# Patient Record
Sex: Female | Born: 1945 | Race: White | Hispanic: No | Marital: Single | Smoking: Former smoker
Health system: Southern US, Community
[De-identification: ages and names within clinical notes are randomized; demographics above are authoritative.]

## PROBLEM LIST (undated history)

## (undated) DIAGNOSIS — M8580 Other specified disorders of bone density and structure, unspecified site: Secondary | ICD-10-CM

## (undated) DIAGNOSIS — K635 Polyp of colon: Secondary | ICD-10-CM

## (undated) DIAGNOSIS — M1990 Unspecified osteoarthritis, unspecified site: Secondary | ICD-10-CM

## (undated) DIAGNOSIS — E785 Hyperlipidemia, unspecified: Secondary | ICD-10-CM

## (undated) DIAGNOSIS — F419 Anxiety disorder, unspecified: Secondary | ICD-10-CM

## (undated) DIAGNOSIS — C4492 Squamous cell carcinoma of skin, unspecified: Secondary | ICD-10-CM

## (undated) HISTORY — DX: Polyp of colon: K63.5

## (undated) HISTORY — DX: Squamous cell carcinoma of skin, unspecified: C44.92

## (undated) HISTORY — PX: CARPAL TUNNEL RELEASE: SHX101

## (undated) HISTORY — DX: Unspecified osteoarthritis, unspecified site: M19.90

## (undated) HISTORY — DX: Hyperlipidemia, unspecified: E78.5

## (undated) HISTORY — PX: WISDOM TOOTH EXTRACTION: SHX21

## (undated) HISTORY — DX: Anxiety disorder, unspecified: F41.9

## (undated) HISTORY — DX: Other specified disorders of bone density and structure, unspecified site: M85.80

## (undated) HISTORY — PX: KNEE ARTHROSCOPY: SUR90

---

## 2002-04-07 ENCOUNTER — Encounter: Payer: Self-pay | Admitting: Emergency Medicine

## 2002-04-07 ENCOUNTER — Emergency Department (HOSPITAL_COMMUNITY): Admission: EM | Admit: 2002-04-07 | Discharge: 2002-04-07 | Payer: Self-pay | Admitting: Emergency Medicine

## 2002-07-18 ENCOUNTER — Encounter (INDEPENDENT_AMBULATORY_CARE_PROVIDER_SITE_OTHER): Payer: Self-pay | Admitting: *Deleted

## 2002-07-18 ENCOUNTER — Ambulatory Visit (HOSPITAL_BASED_OUTPATIENT_CLINIC_OR_DEPARTMENT_OTHER): Admission: RE | Admit: 2002-07-18 | Discharge: 2002-07-18 | Payer: Self-pay | Admitting: General Surgery

## 2003-01-01 ENCOUNTER — Ambulatory Visit (HOSPITAL_BASED_OUTPATIENT_CLINIC_OR_DEPARTMENT_OTHER): Admission: RE | Admit: 2003-01-01 | Discharge: 2003-01-01 | Payer: Self-pay | Admitting: Orthopedic Surgery

## 2003-05-05 ENCOUNTER — Encounter (INDEPENDENT_AMBULATORY_CARE_PROVIDER_SITE_OTHER): Payer: Self-pay | Admitting: *Deleted

## 2003-05-05 ENCOUNTER — Ambulatory Visit (HOSPITAL_COMMUNITY): Admission: RE | Admit: 2003-05-05 | Discharge: 2003-05-05 | Payer: Self-pay | Admitting: Plastic Surgery

## 2003-05-05 ENCOUNTER — Ambulatory Visit (HOSPITAL_BASED_OUTPATIENT_CLINIC_OR_DEPARTMENT_OTHER): Admission: RE | Admit: 2003-05-05 | Discharge: 2003-05-05 | Payer: Self-pay | Admitting: Plastic Surgery

## 2003-06-29 ENCOUNTER — Other Ambulatory Visit: Admission: RE | Admit: 2003-06-29 | Discharge: 2003-06-29 | Payer: Self-pay | Admitting: Family Medicine

## 2003-07-16 ENCOUNTER — Encounter: Admission: RE | Admit: 2003-07-16 | Discharge: 2003-07-16 | Payer: Self-pay | Admitting: Family Medicine

## 2004-09-07 ENCOUNTER — Ambulatory Visit: Payer: Self-pay | Admitting: Family Medicine

## 2005-03-15 ENCOUNTER — Encounter: Payer: Self-pay | Admitting: Family Medicine

## 2005-03-15 ENCOUNTER — Other Ambulatory Visit: Admission: RE | Admit: 2005-03-15 | Discharge: 2005-03-15 | Payer: Self-pay | Admitting: Family Medicine

## 2005-03-15 ENCOUNTER — Ambulatory Visit: Payer: Self-pay | Admitting: Family Medicine

## 2005-03-31 ENCOUNTER — Ambulatory Visit: Payer: Self-pay | Admitting: Family Medicine

## 2005-09-27 ENCOUNTER — Ambulatory Visit: Payer: Self-pay | Admitting: Family Medicine

## 2005-09-29 ENCOUNTER — Encounter: Admission: RE | Admit: 2005-09-29 | Discharge: 2005-09-29 | Payer: Self-pay | Admitting: Family Medicine

## 2006-03-26 ENCOUNTER — Ambulatory Visit: Payer: Self-pay | Admitting: Family Medicine

## 2006-07-03 ENCOUNTER — Ambulatory Visit: Payer: Self-pay | Admitting: Family Medicine

## 2006-07-03 LAB — CONVERTED CEMR LAB
ALT: 19 units/L (ref 0–40)
AST: 20 units/L (ref 0–37)
Albumin: 3.8 g/dL (ref 3.5–5.2)
Alkaline Phosphatase: 65 units/L (ref 39–117)
Bilirubin, Direct: 0.1 mg/dL (ref 0.0–0.3)
Cholesterol: 248 mg/dL (ref 0–200)
Direct LDL: 173.3 mg/dL
HDL: 61 mg/dL (ref >39.0)
Total Bilirubin: 0.6 mg/dL (ref 0.3–1.2)
Total CHOL/HDL Ratio: 4.1
Total Protein: 6.7 g/dL (ref 6.0–8.3)
Triglycerides: 116 mg/dL (ref 0–149)
VLDL: 23 mg/dL (ref 0–40)

## 2006-10-31 ENCOUNTER — Ambulatory Visit: Payer: Self-pay | Admitting: Family Medicine

## 2007-01-01 ENCOUNTER — Ambulatory Visit: Payer: Self-pay | Admitting: Family Medicine

## 2007-01-01 ENCOUNTER — Encounter: Payer: Self-pay | Admitting: Family Medicine

## 2007-01-01 ENCOUNTER — Other Ambulatory Visit: Admission: RE | Admit: 2007-01-01 | Discharge: 2007-01-01 | Payer: Self-pay | Admitting: Family Medicine

## 2007-01-01 ENCOUNTER — Encounter (INDEPENDENT_AMBULATORY_CARE_PROVIDER_SITE_OTHER): Payer: Self-pay | Admitting: *Deleted

## 2007-01-01 DIAGNOSIS — F411 Generalized anxiety disorder: Secondary | ICD-10-CM

## 2007-01-01 DIAGNOSIS — M899 Disorder of bone, unspecified: Secondary | ICD-10-CM | POA: Insufficient documentation

## 2007-01-01 DIAGNOSIS — L723 Sebaceous cyst: Secondary | ICD-10-CM | POA: Insufficient documentation

## 2007-01-01 DIAGNOSIS — M949 Disorder of cartilage, unspecified: Secondary | ICD-10-CM

## 2007-01-01 DIAGNOSIS — Z9889 Other specified postprocedural states: Secondary | ICD-10-CM | POA: Insufficient documentation

## 2007-01-01 LAB — CONVERTED CEMR LAB
Bilirubin Urine: NEGATIVE
Blood in Urine, dipstick: NEGATIVE
Glucose, Urine, Semiquant: NEGATIVE
Ketones, urine, test strip: NEGATIVE
Protein, U semiquant: NEGATIVE

## 2007-01-08 ENCOUNTER — Encounter (INDEPENDENT_AMBULATORY_CARE_PROVIDER_SITE_OTHER): Payer: Self-pay | Admitting: *Deleted

## 2007-01-09 LAB — CONVERTED CEMR LAB
ALT: 17 units/L (ref 0–35)
AST: 18 units/L (ref 0–37)
Alkaline Phosphatase: 65 units/L (ref 39–117)
BUN: 16 mg/dL (ref 6–23)
Basophils Relative: 0.3 % (ref 0.0–1.0)
Bilirubin, Direct: 0.1 mg/dL (ref 0.0–0.3)
CO2: 31 meq/L (ref 19–32)
Calcium: 9.3 mg/dL (ref 8.4–10.5)
Chloride: 107 meq/L (ref 96–112)
Eosinophils Absolute: 0.1 10*3/uL (ref 0.0–0.6)
Eosinophils Relative: 1.6 % (ref 0.0–5.0)
GFR calc non Af Amer: 78 mL/min
Glucose, Bld: 108 mg/dL — ABNORMAL HIGH (ref 70–99)
HDL: 63.2 mg/dL (ref >39.0)
Platelets: 241 10*3/uL (ref 150–400)
RBC: 4.29 M/uL (ref 3.87–5.11)
Total CHOL/HDL Ratio: 4
Triglycerides: 103 mg/dL (ref 0–149)
VLDL: 21 mg/dL (ref 0–40)
WBC: 4.4 10*3/uL — ABNORMAL LOW (ref 4.5–10.5)

## 2007-03-19 ENCOUNTER — Encounter: Admission: RE | Admit: 2007-03-19 | Discharge: 2007-03-19 | Payer: Self-pay | Admitting: Family Medicine

## 2007-03-21 ENCOUNTER — Encounter (INDEPENDENT_AMBULATORY_CARE_PROVIDER_SITE_OTHER): Payer: Self-pay | Admitting: *Deleted

## 2007-04-10 ENCOUNTER — Ambulatory Visit: Payer: Self-pay | Admitting: Family Medicine

## 2007-04-10 ENCOUNTER — Telehealth (INDEPENDENT_AMBULATORY_CARE_PROVIDER_SITE_OTHER): Payer: Self-pay | Admitting: *Deleted

## 2007-04-17 ENCOUNTER — Encounter (INDEPENDENT_AMBULATORY_CARE_PROVIDER_SITE_OTHER): Payer: Self-pay | Admitting: *Deleted

## 2007-04-17 LAB — CONVERTED CEMR LAB
AST: 17 units/L (ref 0–37)
Albumin: 4 g/dL (ref 3.5–5.2)
Bilirubin, Direct: 0.1 mg/dL (ref 0.0–0.3)
Direct LDL: 180.9 mg/dL
HDL: 60.7 mg/dL (ref >39.0)
Total CHOL/HDL Ratio: 4.3
VLDL: 25 mg/dL (ref 0–40)

## 2007-05-06 ENCOUNTER — Telehealth (INDEPENDENT_AMBULATORY_CARE_PROVIDER_SITE_OTHER): Payer: Self-pay | Admitting: *Deleted

## 2007-06-10 ENCOUNTER — Telehealth (INDEPENDENT_AMBULATORY_CARE_PROVIDER_SITE_OTHER): Payer: Self-pay | Admitting: *Deleted

## 2007-07-08 ENCOUNTER — Telehealth (INDEPENDENT_AMBULATORY_CARE_PROVIDER_SITE_OTHER): Payer: Self-pay | Admitting: *Deleted

## 2007-07-31 ENCOUNTER — Ambulatory Visit: Payer: Self-pay | Admitting: Family Medicine

## 2007-08-02 ENCOUNTER — Telehealth (INDEPENDENT_AMBULATORY_CARE_PROVIDER_SITE_OTHER): Payer: Self-pay | Admitting: *Deleted

## 2007-08-11 LAB — CONVERTED CEMR LAB
Alkaline Phosphatase: 68 units/L (ref 39–117)
Bilirubin, Direct: 0.1 mg/dL (ref 0.0–0.3)
CRP, High Sensitivity: 3 (ref 0.00–5.00)
Cholesterol: 264 mg/dL (ref 0–200)
Direct LDL: 176.7 mg/dL
HDL: 66.3 mg/dL (ref >39.0)
Total Bilirubin: 0.5 mg/dL (ref 0.3–1.2)
Total CHOL/HDL Ratio: 4
Total Protein: 7.1 g/dL (ref 6.0–8.3)
Triglycerides: 112 mg/dL (ref 0–149)

## 2007-08-12 ENCOUNTER — Encounter (INDEPENDENT_AMBULATORY_CARE_PROVIDER_SITE_OTHER): Payer: Self-pay | Admitting: *Deleted

## 2007-08-30 ENCOUNTER — Telehealth (INDEPENDENT_AMBULATORY_CARE_PROVIDER_SITE_OTHER): Payer: Self-pay | Admitting: *Deleted

## 2007-09-10 ENCOUNTER — Telehealth (INDEPENDENT_AMBULATORY_CARE_PROVIDER_SITE_OTHER): Payer: Self-pay | Admitting: *Deleted

## 2007-09-26 ENCOUNTER — Ambulatory Visit: Payer: Self-pay | Admitting: Internal Medicine

## 2007-09-26 DIAGNOSIS — E785 Hyperlipidemia, unspecified: Secondary | ICD-10-CM

## 2008-02-13 ENCOUNTER — Ambulatory Visit: Payer: Self-pay | Admitting: Family Medicine

## 2008-03-30 ENCOUNTER — Ambulatory Visit: Payer: Self-pay | Admitting: Family Medicine

## 2008-03-30 DIAGNOSIS — J019 Acute sinusitis, unspecified: Secondary | ICD-10-CM

## 2008-04-17 ENCOUNTER — Ambulatory Visit: Payer: Self-pay | Admitting: Family Medicine

## 2008-04-17 DIAGNOSIS — J069 Acute upper respiratory infection, unspecified: Secondary | ICD-10-CM

## 2008-08-17 ENCOUNTER — Ambulatory Visit: Payer: Self-pay | Admitting: Family Medicine

## 2008-08-17 DIAGNOSIS — Z860100 Personal history of colon polyps, unspecified: Secondary | ICD-10-CM | POA: Insufficient documentation

## 2008-09-04 ENCOUNTER — Encounter (INDEPENDENT_AMBULATORY_CARE_PROVIDER_SITE_OTHER): Payer: Self-pay | Admitting: *Deleted

## 2008-09-10 ENCOUNTER — Encounter: Payer: Self-pay | Admitting: Family Medicine

## 2008-10-05 ENCOUNTER — Ambulatory Visit: Payer: Self-pay | Admitting: Internal Medicine

## 2008-10-19 ENCOUNTER — Encounter: Payer: Self-pay | Admitting: Internal Medicine

## 2008-10-19 ENCOUNTER — Ambulatory Visit: Payer: Self-pay | Admitting: Internal Medicine

## 2008-10-20 ENCOUNTER — Encounter: Payer: Self-pay | Admitting: Internal Medicine

## 2009-01-26 ENCOUNTER — Encounter: Admission: RE | Admit: 2009-01-26 | Discharge: 2009-01-26 | Payer: Self-pay | Admitting: Family Medicine

## 2009-02-16 ENCOUNTER — Ambulatory Visit: Payer: Self-pay | Admitting: Family Medicine

## 2009-02-19 ENCOUNTER — Telehealth (INDEPENDENT_AMBULATORY_CARE_PROVIDER_SITE_OTHER): Payer: Self-pay | Admitting: *Deleted

## 2009-02-23 ENCOUNTER — Encounter: Payer: Self-pay | Admitting: Family Medicine

## 2009-08-11 ENCOUNTER — Other Ambulatory Visit: Admission: RE | Admit: 2009-08-11 | Discharge: 2009-08-11 | Payer: Self-pay | Admitting: Family Medicine

## 2009-08-11 ENCOUNTER — Ambulatory Visit: Payer: Self-pay | Admitting: Family Medicine

## 2009-08-11 DIAGNOSIS — M545 Low back pain: Secondary | ICD-10-CM

## 2009-08-11 DIAGNOSIS — N393 Stress incontinence (female) (male): Secondary | ICD-10-CM

## 2009-08-13 ENCOUNTER — Encounter: Payer: Self-pay | Admitting: Family Medicine

## 2009-08-13 LAB — CONVERTED CEMR LAB: Vit D, 25-Hydroxy: 12 ng/mL — ABNORMAL LOW (ref 30–89)

## 2009-08-16 ENCOUNTER — Encounter (INDEPENDENT_AMBULATORY_CARE_PROVIDER_SITE_OTHER): Payer: Self-pay | Admitting: *Deleted

## 2009-08-23 ENCOUNTER — Telehealth (INDEPENDENT_AMBULATORY_CARE_PROVIDER_SITE_OTHER): Payer: Self-pay | Admitting: *Deleted

## 2009-08-25 ENCOUNTER — Telehealth (INDEPENDENT_AMBULATORY_CARE_PROVIDER_SITE_OTHER): Payer: Self-pay | Admitting: *Deleted

## 2009-08-26 ENCOUNTER — Telehealth: Payer: Self-pay | Admitting: Family Medicine

## 2009-08-26 ENCOUNTER — Encounter (INDEPENDENT_AMBULATORY_CARE_PROVIDER_SITE_OTHER): Payer: Self-pay | Admitting: *Deleted

## 2009-09-06 ENCOUNTER — Encounter: Payer: Self-pay | Admitting: Family Medicine

## 2009-09-13 ENCOUNTER — Ambulatory Visit: Payer: Self-pay | Admitting: Family Medicine

## 2010-02-08 ENCOUNTER — Ambulatory Visit: Payer: Self-pay | Admitting: Family Medicine

## 2010-02-10 ENCOUNTER — Ambulatory Visit: Payer: Self-pay | Admitting: Family Medicine

## 2010-02-14 ENCOUNTER — Telehealth: Payer: Self-pay | Admitting: Family Medicine

## 2010-03-01 ENCOUNTER — Telehealth: Payer: Self-pay | Admitting: Family Medicine

## 2010-05-30 ENCOUNTER — Telehealth (INDEPENDENT_AMBULATORY_CARE_PROVIDER_SITE_OTHER): Payer: Self-pay | Admitting: *Deleted

## 2010-06-01 ENCOUNTER — Ambulatory Visit: Admit: 2010-06-01 | Payer: Self-pay | Admitting: Family Medicine

## 2010-06-02 ENCOUNTER — Encounter: Payer: Self-pay | Admitting: Family Medicine

## 2010-06-02 ENCOUNTER — Ambulatory Visit
Admission: RE | Admit: 2010-06-02 | Discharge: 2010-06-02 | Payer: Self-pay | Source: Home / Self Care | Attending: Family Medicine | Admitting: Family Medicine

## 2010-06-12 LAB — CONVERTED CEMR LAB
Alkaline Phosphatase: 64 units/L (ref 39–117)
Alkaline Phosphatase: 74 units/L (ref 39–117)
BUN: 16 mg/dL (ref 6–23)
BUN: 16 mg/dL (ref 6–23)
Basophils Relative: 1 % (ref 0.0–3.0)
Bilirubin, Direct: 0 mg/dL (ref 0.0–0.3)
Bilirubin, Direct: 0.1 mg/dL (ref 0.0–0.3)
CO2: 30 meq/L (ref 19–32)
Calcium: 9.2 mg/dL (ref 8.4–10.5)
Chloride: 104 meq/L (ref 96–112)
Chloride: 106 meq/L (ref 96–112)
Creatinine, Ser: 0.7 mg/dL (ref 0.4–1.2)
Creatinine, Ser: 0.7 mg/dL (ref 0.4–1.2)
Eosinophils Absolute: 0.1 10*3/uL (ref 0.0–0.7)
Eosinophils Relative: 1.9 % (ref 0.0–5.0)
Glucose, Bld: 107 mg/dL — ABNORMAL HIGH (ref 70–99)
Glucose, Urine, Semiquant: NEGATIVE
Lymphocytes Relative: 19.8 % (ref 12.0–46.0)
MCV: 92.4 fL (ref 78.0–100.0)
Monocytes Absolute: 0.3 10*3/uL (ref 0.1–1.0)
Neutrophils Relative %: 70 % (ref 43.0–77.0)
Nitrite: NEGATIVE
Pap Smear: NEGATIVE
Platelets: 192 10*3/uL (ref 150.0–400.0)
Potassium: 4.5 meq/L (ref 3.5–5.1)
RBC: 4.36 M/uL (ref 3.87–5.11)
Specific Gravity, Urine: 1.015
Total Bilirubin: 0.4 mg/dL (ref 0.3–1.2)
Total Bilirubin: 0.5 mg/dL (ref 0.3–1.2)
WBC Urine, dipstick: NEGATIVE
WBC: 4 10*3/uL — ABNORMAL LOW (ref 4.5–10.5)
pH: 6

## 2010-06-14 NOTE — Assessment & Plan Note (Signed)
Summary: PAP/MEDS/KDC   Vital Signs:  Patient profile:   65 year old female Height:      68.5 inches Weight:      231 pounds BMI:     34.74 Pulse rate:   78 / minute Pulse rhythm:   regular BP sitting:   122 / 84  (left arm) Cuff size:   large  Vitals Entered By: [name] CMA (August 11, 2009 9:56 AM) CC: PAP only refill on meds., Back Pain   History of Present Illness: Pt here for gyn exam and refills only.   Pt c/o bladder leakage with coughing and sneezing etc      This is a 65 year old woman who presents with Back Pain.  The symptoms began >1 year ago.  The patient reports urinary incontinence, but denies fever, chills, weakness, loss of sensation, fecal incontinence, urinary retention, dysuria, rest pain, inability to work, and inability to care for self.  The pain is located in the right low back.  The pain began gradually.  The pain is made worse by standing or walking and activity.  The pain is made better by inactivity and NSAID medications.      Preventive Screening-Counseling & Management  Alcohol-Tobacco     Alcohol drinks/day: <1     Alcohol type: wine     Smoking Status: quit     Year Quit: 1988     Pack years: 48     Passive Smoke Exposure: no  Caffeine-Diet-Exercise     Caffeine use/day: 0     Does Patient Exercise: no  Current Medications (verified): 1)  Zoloft 100 Mg Tabs (Sertraline Hcl) .... Take 1 1/2 Tab Once Daily 2)  Ativan 0.5 Mg Tabs (Lorazepam) .... Take 1 Tablet Once A Day  Allergies: 1)  ! * Statins  Past History:  Past Medical History: Last updated: 09/26/2007 Anxiety osteopenia Hyperlipidemia  Past Surgical History: Last updated: 01/01/2007 Carpal tunnel release (12/2002) L  Family History: Last updated: 01/01/2007 Family History of CAD Female 1st degree relative 65  Social History: Last updated: 01/01/2007 Retired-- Chief Technology Officer financial Single Former Smoker Alcohol use-yes Drug use-no Regular exercise-no  Risk  Factors: Alcohol Use: <1 (08/11/2009) Caffeine Use: 0 (08/11/2009) Exercise: no (08/11/2009)  Risk Factors: Smoking Status: quit (08/11/2009) Passive Smoke Exposure: no (08/11/2009)  Family History: Reviewed history from 01/01/2007 and no changes required. Family History of CAD Female 1st degree relative 64  Social History: Reviewed history from 01/01/2007 and no changes required. Retired-- Archivist Single Former Smoker Alcohol use-yes Drug use-no Regular exercise-no  Review of Systems      See HPI General:  Denies chills, fatigue, fever, loss of appetite, malaise, sleep disorder, sweats, weakness, and weight loss. Eyes:  Denies blurring, discharge, double vision, eye irritation, eye pain, halos, itching, light sensitivity, red eye, vision loss-1 eye, and vision loss-both eyes. ENT:  Denies decreased hearing, difficulty swallowing, ear discharge, earache, hoarseness, nasal congestion, nosebleeds, postnasal drainage, ringing in ears, sinus pressure, and sore throat. CV:  Denies bluish discoloration of lips or nails, chest pain or discomfort, difficulty breathing at night, difficulty breathing while lying down, fainting, fatigue, leg cramps with exertion, lightheadness, near fainting, palpitations, shortness of breath with exertion, swelling of feet, swelling of hands, and weight gain. Resp:  Denies chest discomfort, chest pain with inspiration, cough, coughing up blood, excessive snoring, hypersomnolence, morning headaches, pleuritic, shortness of breath, sputum productive, and wheezing. GI:  Denies abdominal pain, bloody stools, change in bowel habits, constipation,  dark tarry stools, diarrhea, excessive appetite, gas, hemorrhoids, indigestion, loss of appetite, nausea, vomiting, vomiting blood, and yellowish skin color. GU:  Denies abnormal vaginal bleeding, decreased libido, discharge, dysuria, genital sores, hematuria, incontinence, nocturia, urinary frequency, and urinary  hesitancy. MS:  Denies joint pain, joint redness, joint swelling, loss of strength, low back pain, mid back pain, muscle aches, muscle , cramps, muscle weakness, stiffness, and thoracic pain. Derm:  Denies changes in color of skin, changes in nail beds, dryness, excessive perspiration, flushing, hair loss, insect bite(s), itching, lesion(s), poor wound healing, and rash. Neuro:  Denies brief paralysis, difficulty with concentration, disturbances in coordination, falling down, headaches, inability to speak, memory loss, numbness, poor balance, seizures, sensation of room spinning, tingling, tremors, visual disturbances, and weakness. Psych:  Denies alternate hallucination ( auditory/visual), anxiety, depression, easily angered, easily tearful, irritability, mental problems, panic attacks, sense of great danger, suicidal thoughts/plans, thoughts of violence, unusual visions or sounds, and thoughts /plans of harming others. Endo:  Denies cold intolerance, excessive hunger, excessive thirst, excessive urination, heat intolerance, polyuria, and weight change. Heme:  Denies abnormal bruising, bleeding, enlarge lymph nodes, fevers, pallor, and skin discoloration. Allergy:  Denies hives or rash, itching eyes, persistent infections, seasonal allergies, and sneezing.  Physical Exam  General:  Well-developed,well-nourished,in no acute distress; alert,appropriate and cooperative throughout examination Lungs:  Normal respiratory effort, chest expands symmetrically. Lungs are clear to auscultation, no crackles or wheezes. Heart:  normal rate and no murmur.   Abdomen:  Bowel sounds positive,abdomen soft and non-tender without masses, organomegaly or hernias noted. Rectal:  No external abnormalities noted. Normal sphincter tone. No rectal masses or tenderness. Genitalia:  Pelvic Exam:        External: normal female genitalia without lesions or masses        Vagina: normal without lesions or masses        Cervix:  normal without lesions or masses        Adnexa: normal bimanual exam without masses or fullness        Uterus: normal by palpation        Pap smear: performed Msk:  normal ROM, no joint tenderness, no joint swelling, no joint warmth, no redness over joints, no joint deformities, no joint instability, and no crepitation.   Extremities:  No clubbing, cyanosis, edema, or deformity noted with normal full range of motion of all joints.   Neurologic:  alert & oriented X3, strength normal in all extremities, gait normal, and DTRs symmetrical and normal.   Psych:  Oriented X3 and normally interactive.     Impression & Recommendations:  Problem # 1:  ROUTINE GYNECOLOGICAL EXAMINATION (ICD-V72.31)  Orders: Venipuncture (81191) TLB-BMP (Basic Metabolic Panel-BMET) (80048-METABOL) TLB-CBC Platelet - w/Differential (85025-CBCD) TLB-Hepatic/Liver Function Pnl (80076-HEPATIC) TLB-TSH (Thyroid Stimulating Hormone) (84443-TSH) T-NMR, Lipoprofile (47829-56213) T-Vitamin D (25-Hydroxy) (08657-84696) UA Dipstick w/o Micro (manual) (29528)  Problem # 2:  LOW BACK PAIN, CHRONIC (ICD-724.2)  Orders: T-Lumbar Spine 2 Views (72100TC) UA Dipstick w/o Micro (manual) (41324)  Discussed use of moist heat or ice, modified activities, medications, and stretching/strengthening exercises. Back care instructions given. To be seen in 2 weeks if no improvement; sooner if worsening of symptoms.   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 Take 1 1/2 tab once daily 2)  Ativan 0.5 Mg Tabs (Lorazepam) .Marland KitchenMarland KitchenMarland Kitchen  Take 1 tablet once a day 3)  Vesicare 5 Mg Tabs (Solifenacin succinate) .Marland Kitchen.. 1 by mouth once daily Prescriptions: ATIVAN 0.5 MG TABS (LORAZEPAM) Take 1 tablet once a day  #30 x 5   Entered and Authorized by:   Loreen Freud DO   Signed by:    Loreen Freud DO on 08/11/2009   Method used:   Print then Give to Patient   RxID:   [id number] ZOLOFT 100 MG TABS (SERTRALINE HCL) take 1 1/2 tab once daily  #52 x 5   Entered and Authorized by:   Loreen Freud DO   Signed by:   Loreen Freud DO on 08/11/2009   Method used:   Electronically to        [facility]       [address]       [place], [place]  [address]       Ph: [phone] or [phone]       Fax: [phone]   RxID:   [id number]   Laboratory Results   Urine Tests    Routine Urinalysis   Color: yellow Appearance: Clear Glucose: negative   (Normal Range: Negative) Bilirubin: negative   (Normal Range: Negative) Ketone: negative   (Normal Range: Negative) Spec. Gravity: 1.015   (Normal Range: 1.003-1.035) Blood: negative   (Normal Range: Negative) pH: 6.0   (Normal Range: 5.0-8.0) Protein: negative   (Normal Range: Negative) Urobilinogen: 0.2   (Normal Range: 0-1) Nitrite: negative   (Normal Range: Negative) Leukocyte Esterace: negative   (Normal Range: Negative)    Comments: [name] CMA  August 11, 2009 10:55 AM

## 2010-06-14 NOTE — Progress Notes (Signed)
Summary: Meds  Phone Note Call from Patient   Summary of Call: Pt received labs and it stated she needed to take Vitamin E [address], is it supposed to be Vitamin D 60454? Also states Vesicare is working well. Will send in Rx. [name] CMA  August 26, 2009 10:16 AM   Follow-up for Phone Call        yes---sorry Follow-up by: Loreen Freud DO,  August 26, 2009 10:40 AM  Additional Follow-up for Phone Call Additional follow up Details #1::        Pt is aware. [name] CMA  August 26, 2009 10:43 AM     New/Updated Medications: VITAMIN D (ERGOCALCIFEROL) 50000 UNIT CAPS (ERGOCALCIFEROL) 1 by mouth once weekly. Prescriptions: VITAMIN D (ERGOCALCIFEROL) 50000 UNIT CAPS (ERGOCALCIFEROL) 1 by mouth once weekly.  #4 x 2   Entered by:   [name] CMA   Authorized by:   Loreen Freud DO   Signed by:   [name] CMA on 08/26/2009   Method used:   Electronically to        [facility]       [address]       [place], [place]  [address]       Ph: [phone] or [phone]       Fax: [phone]   RxID:   [id number] VESICARE 5 MG TABS (SOLIFENACIN SUCCINATE) 1 by mouth once daily  #30 x 0   Entered by:   [name] CMA   Authorized by:   Loreen Freud DO   Signed by:   [name] CMA on 08/26/2009   Method used:   Electronically to        [facility] ([facility]       [address]       [place], [place]  [address]       Ph: [phone] or [phone]       Fax: [phone]   RxID:   [id number]

## 2010-06-14 NOTE — Letter (Signed)
Summary: Appt Scheduled/[facility]  Appt Scheduled/[facility]   Imported By: Lanelle Bal 08/20/2009 10:43:48  _____________________________________________________________________  External Attachment:    Type:   Image     Comment:   External Document

## 2010-06-14 NOTE — Miscellaneous (Signed)
Summary: [facility]  [facility]   Imported By: Lanelle Bal 09/14/2009 14:35:00  _____________________________________________________________________  External Attachment:    Type:   Image     Comment:   External Document

## 2010-06-14 NOTE — Progress Notes (Signed)
Summary: Results/Pt did not take meds  Phone Note Outgoing Call   Call placed by: Almeta Monas CMA Duncan Dull),  March 01, 2010 9:49 AM Call placed to: Patient Details for Reason: Results Summary of Call: LDL particle numbers still very high goal < 1000  and LDL goal < 100---- increase pravachol to 40 mg #30  1 by mouth at bedtime ,  2 refills and recheck 3 months---------272.4  [facility] with ov 2 weeks later  Called pt and cell [phone] and adv of the labs results, Pt stated that she had not been on any meds 3 mos prior to having labs done, said when her 3 mo supply was up she never called back in for refills because she did not have time. Please advise on recommendations Initial call taken by: Almeta Monas CMA Duncan Dull),  March 01, 2010 1:27 PM  Follow-up for Phone Call        start pravachol 20 mg then----recheck labs in 3 months-----  272.4  [facility]----ov 3 weeks after labs Follow-up by: Loreen Freud DO,  March 01, 2010 1:32 PM  Additional Follow-up for Phone Call Additional follow up Details #1::        pt aware. [facility] Additional Follow-up by: Almeta Monas CMA Duncan Dull),  March 01, 2010 3:01 PM    Prescriptions: PRAVACHOL 20 MG TABS (PRAVASTATIN SODIUM) 1 by mouth at bedtime.  #30 x 2   Entered by:   Almeta Monas CMA (AAMA)   Authorized by:   Loreen Freud DO   Signed by:   Almeta Monas CMA (AAMA) on 03/01/2010   Method used:   Faxed to ...       [id number] (ret[facility]       [address]       [place], [place]  [address]       Ph: [phone] or [phone]       Fax: [phone]   RxID:   [id number]

## 2010-06-14 NOTE — Assessment & Plan Note (Signed)
Summary: MED REFILL//KN   Vital Signs:  Patient profile:   65 year old female Weight:      230 pounds Temp:     98.3 degrees F oral Pulse rate:   76 / minute Pulse rhythm:   regular BP sitting:   114 / 68  (right arm) Cuff size:   large  Vitals Entered By: Almeta Monas CMA Duncan Dull) (February 08, 2010 4:18 PM) CC: Med f/u   History of Present Illness: Pt here f/u zoloft.  Pt doing well with dose.  No complaints.    Current Medications (verified): 1)  Zoloft 100 Mg Tabs (Sertraline Hcl) .... Take 1 1/2 Tab Once Daily 2)  Ativan 0.5 Mg Tabs (Lorazepam) .... Take 1 Tablet Once A Day 3)  Vesicare 5 Mg Tabs (Solifenacin Succinate) .Marland Kitchen.. 1 By Mouth Once Daily 4)  Pravachol 20 Mg Tabs (Pravastatin Sodium) .Marland Kitchen.. 1 By Mouth At Bedtime. 5)  Vitamin D (Ergocalciferol) 50000 Unit Caps (Ergocalciferol) .Marland Kitchen.. 1 By Mouth Once Weekly.  Allergies (verified): 1)  ! * Statins  Past History:  Past medical, surgical, family and social histories (including risk factors) reviewed for relevance to current acute and chronic problems.  Past Medical History: Reviewed history from 09/26/2007 and no changes required. Anxiety osteopenia Hyperlipidemia  Past Surgical History: Reviewed history from 01/01/2007 and no changes required. Carpal tunnel release (12/2002) L  Family History: Reviewed history from 01/01/2007 and no changes required. Family History of CAD Female 1st degree relative 2  Social History: Reviewed history from 01/01/2007 and no changes required. Retired-- Archivist Single Former Smoker Alcohol use-yes Drug use-no Regular exercise-no  Review of Systems      See HPI  Physical Exam  General:  Well-developed,well-nourished,in no acute distress; alert,appropriate and cooperative throughout examination Lungs:  Normal respiratory effort, chest expands symmetrically. Lungs are clear to auscultation, no crackles or wheezes. Heart:  Normal rate and regular rhythm. S1 and  S2 normal without gallop, murmur, click, rub or other extra sounds. Extremities:  No clubbing, cyanosis, edema, or deformity noted with normal full range of motion of all joints.   Psych:  Cognition and judgment appear intact. Alert and cooperative with normal attention span and concentration. No apparent delusions, illusions, hallucinations   Impression & Recommendations:  Problem # 1:  ANXIETY (ICD-300.00)  Her updated medication list for this problem includes:    Zoloft 100 Mg Tabs (Sertraline hcl) .Marland Kitchen... Take 1 1/2 tab once daily    Ativan 0.5 Mg Tabs (Lorazepam) .Marland Kitchen... Take 1 tablet once a day  Discussed medication use and relaxation techniques.   Problem # 2:  HYPERLIPIDEMIA (ICD-272.4)  Her updated medication list for this problem includes:    Pravachol 20 Mg Tabs (Pravastatin sodium) .Marland Kitchen... 1 by mouth at bedtime.  Labs Reviewed: SGOT: 24 (08/11/2009)   SGPT: 24 (08/11/2009)   HDL:66.3 (07/31/2007), 60.7 (04/10/2007)  LDL:DEL (07/31/2007), DEL (04/10/2007)  Chol:264 (07/31/2007), 263 (04/10/2007)  Trig:112 (07/31/2007), 125 (04/10/2007)  Complete Medication List: 1)  Zoloft 100 Mg Tabs (Sertraline hcl) .... Take 1 1/2 tab once daily 2)  Ativan 0.5 Mg Tabs (Lorazepam) .... Take 1 tablet once a day 3)  Vesicare 5 Mg Tabs (Solifenacin succinate) .Marland Kitchen.. 1 by mouth once daily 4)  Pravachol 20 Mg Tabs (Pravastatin sodium) .Marland Kitchen.. 1 by mouth at bedtime. 5)  Vitamin D (ergocalciferol) 50000 Unit Caps (Ergocalciferol) .Marland Kitchen.. 1 by mouth once weekly.  Other Orders: Admin 1st Vaccine (46962) Flu Vaccine 79yrs + [phone])  Patient Instructions: 1)  Please schedule a follow-up appointment in 6 months .  2)  fasting labs---NMR, hep, vita D , bmp---272.4,  vita D deficiency Prescriptions: ATIVAN 0.5 MG TABS (LORAZEPAM) Take 1 tablet once a day  #30 x 5   Entered and Authorized by:   Loreen Freud DO   Signed by:   Loreen Freud DO on 02/08/2010   Method used:   Printed then [facility]ed to ...       [facility]       [address]       [place], [place]  [address]       Ph: [phone] or [phone]       Fax: [phone]   RxID:   [id number] ZOLOFT 100 MG TABS (SERTRALINE HCL) take 1 1/2 tab once daily  #52 Tablet x 5   Entered and Authorized by:   Loreen Freud DO   Signed by:   Loreen Freud DO on 02/08/2010   Method used:   Electronically to        [facility]       [address]       [place], [place]  [address]       Ph: [phone] or [phone]       Fax: [phone]   RxID:   [id number]  Flu Vaccine Consent Questions     Do you have a history of severe allergic reactions to this vaccine? no    Any prior history of allergic reactions to egg and/or gelatin? no    Do you have a sensitivity to the preservative Thimersol? no    Do you have a past history of Guillan-Barre Syndrome? no    Do you currently have an acute febrile illness? no    Have you ever had a severe reaction to latex? no    Vaccine information given and explained to patient? yes    Are you currently pregnant? no    Lot Number:[id number]   Exp Date:11/12/2010   Site Given  Left Deltoid IM       [address]       [place], [place]  [address]       Ph: [phone] or [phone]       Fax: [phone]   RxID:   [id number]  .lbflu

## 2010-06-14 NOTE — Progress Notes (Signed)
Summary: lab results  Phone Note Outgoing Call   Call placed by: [facility] CMA,  February 14, 2010 9:12 AM Details for Reason: was pt fasting?  i f no repeat fasting with hgbaqc  790.6 Summary of Call: Spoke with pt she was fasting at time of labs.pls advise...............................Marland KitchenFelecia Deloach CMA  February 14, 2010 9:13 AM   Follow-up for Phone Call        watch simple sugars and starches---- read "sugar buster diet"---recheck 3 months 790.6  bmp, hgba1c Follow-up by: Loreen Freud DO,  February 14, 2010 9:20 AM  Additional Follow-up for Phone Call Additional follow up Details #1::        Patient notified and will call back for appt. Additional Follow-up by: Lucious Groves CMA,  February 15, 2010 4:27 PM

## 2010-06-14 NOTE — Letter (Signed)
Summary: [facility]: Immunoassay Fecal Occult Blood (iFOB) Order Form  [facility]  [address] [place], [place] [address]   Phone: [phone]  Fax: [phone]       Lab: Immunoassay Fecal Occult Blood (iFOB) Order Form   August 26, 2009 MRN: [id number]   Allison Cox 05-20-1945   Physicican Name:_____Yvonne Lowne,DO____________________  Diagnosis Code:_____v76.51_____________________      [name] CMA

## 2010-06-14 NOTE — Letter (Signed)
Summary: Results Follow up Letter  [facility]  [address] [place], [place] [address]   Phone: [phone]  Fax: [phone]    08/16/2009 MRN: [id number]  [facility] [address] [place], [place]  [address]  Dear Ms. Pickford,  The following are the results of your recent test(s):  Test         Result    Pap Smear:        Normal __X___  Not Normal _____ Comments: ______________________________________________________ Cholesterol: LDL(Bad cholesterol):         Your goal is less than:         HDL (Good cholesterol):       Your goal is more than: Comments:  ______________________________________________________ Mammogram:        Normal _____  Not Normal _____ Comments:  ___________________________________________________________________ Hemoccult:        Normal _____  Not normal _______ Comments:    _____________________________________________________________________ Other Tests:    We routinely do not discuss normal results over the telephone.  If you desire a copy of the results, or you have any questions about this information we can discuss them at your next office visit.   Sincerely,    [name] CMA  August 16, 2009 8:03 AM

## 2010-06-14 NOTE — Progress Notes (Signed)
Summary: Lab Results   Phone Note Outgoing Call   Call placed by: Jeremy Johann CMA,  August 25, 2009 4:27 PM Summary of Call: left message to call office...........Marland KitchenFelecia Deloach CMA  August 25, 2009 4:27 PM   LDL particle number high---increased risk heart attack and stroke---start pravachol 20 mg #30  1 by mouth at bedtime , 2 refills----recheck 3 months ----nmr, hep 272.4  Follow-up for Phone Call        Pt is aware. [name] CMA  August 26, 2009 10:35 AM     New/Updated Medications: PRAVACHOL 20 MG TABS (PRAVASTATIN SODIUM) 1 by mouth at bedtime. Prescriptions: PRAVACHOL 20 MG TABS (PRAVASTATIN SODIUM) 1 by mouth at bedtime.  #30 x 2   Entered by:   [name] CMA   Authorized by:   Loreen Freud DO   Signed by:   [name] CMA on 08/26/2009   Method used:   Electronically to        [facility]       [address]       [place], [place]  [address]       Ph: [phone] or [phone]       Fax: [phone]   RxID:   [id number]

## 2010-06-14 NOTE — Progress Notes (Signed)
Summary: iFob   Phone Note From Other Clinic   Caller: [facility] Summary of Call: The lab called and stated that they received the iFob kit, but they needed a new sample. I called and left message for pt, I will put a new kit up front for her to pick up. [name] CMA  August 23, 2009 8:43 AM   Follow-up for Phone Call        [facility]. [name] CMA  August 24, 2009 4:27 PM   Additional Follow-up for Phone Call Additional follow up Details #1::        Pt is aware will pick up new kit. [name] CMA  August 26, 2009 10:15 AM

## 2010-06-16 NOTE — Progress Notes (Signed)
Summary: Refill Request  Phone Note Refill Request Call back at [phone] Message from:  Pharmacy on May 30, 2010 7:57 AM  Refills Requested: Medication #1:  PRAVACHOL 20 MG TABS 1 by mouth at bedtime.   Dosage confirmed as above?Dosage Confirmed   Brand Name Necessary? No   Supply Requested: 30   [facility] [address]  Next Appointment Scheduled: 2.6.12 Initial call taken by: Harold Barban,  May 30, 2010 7:57 AM  Follow-up for Phone Call        Called and cancelled Rx sent to [facility]... Follow-up by: Almeta Monas CMA Duncan Dull),  May 30, 2010 10:58 AM    Prescriptions: PRAVACHOL 20 MG TABS (PRAVASTATIN SODIUM) 1 by mouth at bedtime.  #30 x 1   Entered by:   Almeta Monas CMA (AAMA)   Authorized by:   Loreen Freud DO   Signed by:   Almeta Monas CMA (AAMA) on 05/30/2010   Method used:   [facility] to ...       [facility]  [phone][facility] (retail)       [address] [place], [place]  [address]       Ph: [phone] or [phone]       Fax: [phone]   RxID:   [id number] PRAVACHOL 20 MG TABS (PRAVASTATIN SODIUM) 1 by mouth at bedtime.  #30 x 1   Entered by:   Almeta Monas CMA (AAMA)   Authorized by:   Loreen Freud DO   Signed by:   Almeta Monas CMA (AAMA) on 05/30/2010   Method used:   Electronically to        [facility]* (retail)       [address]       [place], [place]  [address]       Ph: [phone]       Fax: [phone]   RxID:   [id number]

## 2010-06-20 ENCOUNTER — Encounter: Payer: Self-pay | Admitting: Family Medicine

## 2010-06-20 ENCOUNTER — Ambulatory Visit (INDEPENDENT_AMBULATORY_CARE_PROVIDER_SITE_OTHER): Payer: BC Managed Care – PPO | Admitting: Family Medicine

## 2010-06-20 DIAGNOSIS — E785 Hyperlipidemia, unspecified: Secondary | ICD-10-CM

## 2010-06-30 NOTE — Assessment & Plan Note (Signed)
Summary: TO GO OVER LABS  /PH  Nurse Visit   Vital Signs:  Patient profile:   65 year old Cox Weight:      220.2 pounds BMI:     33.11 Pulse rate:   76 / minute Pulse rhythm:   regular BP sitting:   116 / 74  (left arm) Cuff size:   large  Vitals Entered By: Almeta Monas CMA Duncan Dull) (June 20, 2010 9:33 AM)  Patient Instructions: 1)  recheck fasting labs 3 months---272.4  lipid, hep   History of Present Illness: Pt here to go over labs only.  CC: review labs   Current Medications (verified): 1)  Zoloft 100 Mg Tabs (Sertraline Hcl) .... Take 1 1/2 Tab Once Daily 2)  Ativan 0.5 Mg Tabs (Lorazepam) .... Take 1 Tablet Once A Day 3)  Vesicare 5 Mg Tabs (Solifenacin Succinate) .Marland Kitchen.. 1 By Mouth Once Daily 4)  Zocor 40 Mg Tabs (Simvastatin) .Marland Kitchen.. 1 By Mouth At Bedtime 5)  Vitamin D (Ergocalciferol) 50000 Unit Caps (Ergocalciferol) .Marland Kitchen.. 1 By Mouth Once Weekly.  Allergies (verified): 1)  ! * Statins  Orders Added: 1)  Est. Patient Level III [01027]  Physical Exam  General:  Well-developed,well-nourished,in no acute distress; alert,appropriate and cooperative throughout examination Psych:  Oriented X3 and normally interactive.     Impression & Recommendations:  Problem # 1:  HYPERLIPIDEMIA (ICD-272.4) see [facility] Her updated medication list for this problem includes:    Zocor 40 Mg Tabs (Simvastatin) .Marland Kitchen... 1 by mouth at bedtime  Labs Reviewed: SGOT: 18 (02/10/2010)   SGPT: 18 (02/10/2010)   HDL:66.3 (07/31/2007), 60.7 (04/10/2007)  LDL:DEL (07/31/2007), DEL (04/10/2007)  Chol:264 (07/31/2007), 263 (04/10/2007)  Trig:112 (07/31/2007), 125 (04/10/2007)  Complete Medication List: 1)  Zoloft 100 Mg Tabs (Sertraline hcl) .... Take 1 1/2 tab once daily 2)  Ativan 0.5 Mg Tabs (Lorazepam) .... Take 1 tablet once a day 3)  Vesicare 5 Mg Tabs (Solifenacin succinate) .Marland Kitchen.. 1 by mouth once daily 4)  Zocor 40 Mg Tabs (Simvastatin) .Marland Kitchen.. 1 by mouth at bedtime 5)   Vitamin D (ergocalciferol) 50000 Unit Caps (Ergocalciferol) .Marland Kitchen.. 1 by mouth once weekly.  Prescriptions: ZOCOR 40 MG TABS (SIMVASTATIN) 1 by mouth at bedtime  #30 x 5   Entered and Authorized by:   Loreen Freud DO   Signed by:   Loreen Freud DO on 06/20/2010   Method used:   Electronically to        [id number] (retail)       [address]       [place], [place]  [address]       Ph: [phone] or [phone]       Fax: [phone]   RxID:   [id number] ATIVAN 0.5 MG TABS (LORAZEPAM) Take 1 tablet once a day  #30 x 5   Entered and Authorized by:   Loreen Freud DO   Signed by:   Loreen Freud DO on 06/20/2010   Method used:   Print then Give to Patient   RxID:   [id number] ZOLOFT 100 MG TABS (SERTRALINE HCL) take 1 1/2 tab once daily  #52 Tablet x 5   Entered and Authorized by:   Loreen Freud DO   Signed by:   Loreen Freud DO on 06/20/2010   Method used:   Electronically to        [id number] (retail)       [address]  [place], [place]  [address]       Ph: [phone] or [phone]       Fax: [phone]   RxID:   [id number]

## 2010-08-01 ENCOUNTER — Telehealth (INDEPENDENT_AMBULATORY_CARE_PROVIDER_SITE_OTHER): Payer: Self-pay | Admitting: *Deleted

## 2010-08-03 ENCOUNTER — Other Ambulatory Visit: Payer: Self-pay | Admitting: Family Medicine

## 2010-08-05 NOTE — Telephone Encounter (Signed)
Spoke with patient to check status of RX--- we gave the patient a RX 06/20/10 with 5 refills and she stated she had some issues with the pharmacies on [address] and [address] But she got it straight and has her RX already

## 2010-08-11 NOTE — Progress Notes (Signed)
Summary: Ativan refill  Phone Note Refill Request Message from:  Fax from Pharmacy on August 01, 2010 5:01 PM  Refills Requested: Medication #1:  ATIVAN 0.5 MG TABS Take 1 tablet once a day Next Appointment Scheduled: Mon 5/7   lab Initial call taken by: Jerolyn Shin,  August 01, 2010 5:02 PM  Follow-up for Phone Call        rx filled 06/20/10 with 5 refills Follow-up by: Almeta Monas CMA Duncan Dull),  August 02, 2010 8:39 AM

## 2010-09-19 ENCOUNTER — Other Ambulatory Visit (INDEPENDENT_AMBULATORY_CARE_PROVIDER_SITE_OTHER): Payer: BC Managed Care – PPO

## 2010-09-19 DIAGNOSIS — E785 Hyperlipidemia, unspecified: Secondary | ICD-10-CM

## 2010-09-19 LAB — LIPID PANEL
Cholesterol: 183 mg/dL (ref 0–200)
HDL: 67.7 mg/dL
LDL Cholesterol: 101 mg/dL — ABNORMAL HIGH (ref 0–99)
Total CHOL/HDL Ratio: 3
Triglycerides: 70 mg/dL (ref 0.0–149.0)
VLDL: 14 mg/dL (ref 0.0–40.0)

## 2010-09-19 LAB — HEPATIC FUNCTION PANEL
Albumin: 4 g/dL (ref 3.5–5.2)
Total Protein: 6.4 g/dL (ref 6.0–8.3)

## 2010-09-29 ENCOUNTER — Other Ambulatory Visit: Payer: Self-pay

## 2010-09-29 MED ORDER — SERTRALINE HCL 100 MG PO TABS: 150.0000 mg | ORAL_TABLET | Freq: Every day | ORAL | Status: DC

## 2010-09-29 NOTE — Telephone Encounter (Signed)
Last seen 06/30/10 and filled 08/29/2010 please advise     KP

## 2010-09-30 NOTE — Op Note (Signed)
   NAME:  Allison Cox, Allison Cox                           ACCOUNT NO.:  [id number]   MEDICAL RECORD NO.:  [id number]                   PATIENT TYPE:  AMB   LOCATION:  DSC                                  FACILITY:  [facility]   PHYSICIAN:  Cindee Salt, M.D.                    DATE OF BIRTH:  November 01, 1945   DATE OF PROCEDURE:  01/01/2003  DATE OF DISCHARGE:                                 OPERATIVE REPORT   PREOPERATIVE DIAGNOSIS:  Carpal tunnel syndrome, left hand.   POSTOPERATIVE DIAGNOSIS:  Carpal tunnel syndrome, left hand.   OPERATION:  Decompression of left median nerve.   SURGEON:  Cindee Salt, M.D.   ASSISTANT:  Eual Fines, M.D.   ANESTHESIA:  Forearm based IV regional.   HISTORY OF PRESENT ILLNESS:  The patient is a 65 year old female with a  history of carpal tunnel syndrome, EMG nerve conduction is positive which  has not responded to conservative treatment.   DESCRIPTION OF PROCEDURE:  The patient is brought to the operating room  where a forearm based IV regional anesthetic was carried out without  difficulty.  She was prepped using Duraprep in supine position, left arm  free.  A longitudinal incision was made in the palm, carried down through  subcutaneous tissues.  Bleeders were electrocauterized.  Palmar fascia was  split.  Superficial palmar arch identified.  The flexor tendon to the ring  and little finger identified.  To the ulnar side of the median nerve, the  carpal retinaculum was incised with sharp dissection.  A right angle and  Sewell retractor were placed between skin and forearm fascia.  The fascia  was released for approximately 3 cm proximal to the wrist crease under  direct vision.  Canal was explored and no further lesions were identified.  The wound was irrigated.  Skin was closed with interrupted 5-0 nylon  sutures.  The tenosynovial tissue was moderately thickened.  A sterile  compressive dressing and splint was applied.  The patient tolerated the  procedure  well, and was taken to the recovery room for observation in  satisfactory condition.  She is discharged home to return to the [facility]  of [place] in one week on Vicodin and Keflex.                                               Cindee Salt, M.D.    GK/MEDQ  D:  01/01/2003  T:  01/01/2003  Job:  [id number]

## 2010-09-30 NOTE — Op Note (Signed)
NAME:  Allison Cox, Allison Cox                           ACCOUNT NO.:  [id number]   MEDICAL RECORD NO.:  [id number]                   PATIENT TYPE:  AMB   LOCATION:  DSC                                  FACILITY:  [facility]   PHYSICIAN:  Brantley Persons, M.D.             DATE OF BIRTH:  February 03, 1946   DATE OF PROCEDURE:  05/05/2003  DATE OF DISCHARGE:                                 OPERATIVE REPORT   PREOPERATIVE DIAGNOSIS:  Right lateral preauricular sebaceous cyst.   POSTOPERATIVE DIAGNOSIS:  Right lateral preauricular sebaceous cyst.   PROCEDURE:  Excision of 1.6 cm cyst right preauricular cheek area.   SURGEON:  Mary A. Contogiannis, M.D.   ANESTHESIA:  1% lidocaine with epinephrine.   COMPLICATIONS:  None.   INDICATIONS FOR PROCEDURE:  The patient is a 65 year old Caucasian female  who has had a sebaceous cyst located in the right preauricular area along  the cheek.  Unfortunately, this cyst became infected but that infection has  been drained.  The wound has now healed over and there are no more signs of  inflammation or infection.  She, therefore, presents to undergo excision of  the mass.   PROCEDURE:  The patient was brought to the minor room and placed on the  table in supine position.  The right face was prepped with Betadine and  draped in a sterile fashion.  The skin and subcutaneous tissue in the area  of the soft tissue mass were then injected with 1% lidocaine with  epinephrine.  After adequate hemostasis and anesthesia had taken effect, the  procedure was begun.  Using loupe magnification, the area of the previous  drainage of the cyst was identified on the skin.  An incision was made in  this area and actually the skin involved with that previous incision and  drainage was excised.  Upon entering the subcutaneous layer, it is evident  that a cyst was present.  The cyst was removed with its entire lining.  Part  of the lining was entered during the course of the  dissection, however, all  the lining and the cyst contents were removed.  The specimen was passed off  the table to undergo permanent pathologic evaluation.  The wound was then  cleansed of any remaining cyst debris.  The incision was then closed using a  5-0 Monocryl in the dermal layer followed by a 6-0 Prolene suture on the  skin.  The incision was dressed with Bacitracin ointment and a Band-Aid.  There were no complications.  The patient tolerated the procedure well.  She  was then given proper postoperative wound care instructions and discharged  home in stable condition.  Follow up appointment tomorrow in the office.  Brantley Persons, M.D.    MC/MEDQ  D:  05/05/2003  T:  05/06/2003  Job:  [id number]

## 2010-09-30 NOTE — Op Note (Signed)
   NAME:  Allison Cox, Allison Cox                           ACCOUNT NO.:  [id number]   MEDICAL RECORD NO.:  [id number]                   PATIENT TYPE:  AMB   LOCATION:  DSC                                  FACILITY:  [facility]   PHYSICIAN:  Ollen Gross. Vernell Morgans, M.D.              DATE OF BIRTH:  06/05/1945   DATE OF PROCEDURE:  07/18/2002  DATE OF DISCHARGE:  07/18/2002                                 OPERATIVE REPORT   PREOPERATIVE DIAGNOSIS:  Recurrent cyst of the abdominal wall.   POSTOPERATIVE DIAGNOSIS:  Recurrent cyst of the abdominal wall.   PROCEDURE:  Excision of a 1-cm mass from the left lower quadrant abdominal  wall.   SURGEON:  Ollen Gross. Carolynne Edouard, M.D.   ANESTHESIA:  Local.   DESCRIPTION OF PROCEDURE:  After informed consent was obtained the patient  was brought  to the operating room and placed in the supine position on the  operating table. After the area in question was prepped with Betadine and  draped in the usual sterile manner, the area was then infiltrated with 1%  Lidocaine with epinephrine.   Once adequate field block had been obtained, the mass in question was  excised with an elliptical incision using a #15 blade knife. This incision  was carried down through the skin down to the subcutaneous tissue sharply  with the #15 blade knife until the mass was completely excised. It was sent  to pathology for further evaluation.   The wound was examined and was hemostatic. The incision was then closed with  interrupted 4-0 Monocryl subcuticular stitches. Benzoin and Steri-Strips and  sterile dressings were applied.   The patient tolerated the procedure well. All sponge, instrument and needle  counts were correct at the end of the case. The patient was then awakened  and taken to the recovery room in stable condition.                                               Ollen Gross. Vernell Morgans, M.D.    PST/MEDQ  D:  07/21/2002  T:  07/22/2002  Job:  [id number]

## 2010-12-23 ENCOUNTER — Other Ambulatory Visit: Payer: Self-pay | Admitting: Family Medicine

## 2010-12-23 MED ORDER — LORAZEPAM 0.5 MG PO TABS: 0.5000 mg | ORAL_TABLET | Freq: Every day | ORAL | Status: DC

## 2010-12-23 NOTE — Telephone Encounter (Signed)
Need more info

## 2010-12-23 NOTE — Telephone Encounter (Signed)
Refill x1---due ov

## 2010-12-23 NOTE — Telephone Encounter (Signed)
Rx called in and pt aware that she needs a follow up appointment. Pt to call back to make an appointment.

## 2010-12-23 NOTE — Telephone Encounter (Signed)
Last ov 02/08/10 Last refill 08/01/10

## 2011-01-03 ENCOUNTER — Ambulatory Visit (INDEPENDENT_AMBULATORY_CARE_PROVIDER_SITE_OTHER): Payer: BC Managed Care – PPO | Admitting: Family Medicine

## 2011-01-03 ENCOUNTER — Encounter: Payer: Self-pay | Admitting: Family Medicine

## 2011-01-03 VITALS — BP 116/64 | HR 67 | Temp 98.8°F | Wt 224.2 lb

## 2011-01-03 DIAGNOSIS — Z2911 Encounter for prophylactic immunotherapy for respiratory syncytial virus (RSV): Secondary | ICD-10-CM

## 2011-01-03 DIAGNOSIS — F411 Generalized anxiety disorder: Secondary | ICD-10-CM

## 2011-01-03 DIAGNOSIS — Z0000 Encounter for general adult medical examination without abnormal findings: Secondary | ICD-10-CM

## 2011-01-03 DIAGNOSIS — E785 Hyperlipidemia, unspecified: Secondary | ICD-10-CM

## 2011-01-03 DIAGNOSIS — F419 Anxiety disorder, unspecified: Secondary | ICD-10-CM

## 2011-01-03 MED ORDER — SERTRALINE HCL 100 MG PO TABS: 150.0000 mg | ORAL_TABLET | Freq: Every day | ORAL | Status: DC

## 2011-01-03 MED ORDER — LORAZEPAM 0.5 MG PO TABS: 0.5000 mg | ORAL_TABLET | Freq: Every day | ORAL | Status: DC

## 2011-01-03 MED ORDER — SIMVASTATIN 40 MG PO TABS: 40.0000 mg | ORAL_TABLET | Freq: Every day | ORAL | Status: DC

## 2011-01-03 NOTE — Patient Instructions (Signed)

## 2011-01-03 NOTE — Assessment & Plan Note (Signed)
meds refilled 

## 2011-01-03 NOTE — Progress Notes (Signed)
  Subjective:    Patient ID: Allison Cox, female    DOB: [birth year], 65 y.o.   MRN: [id number]  HPI Pt here f/u anxiety and hyperlipidemia.  Pt with not complaints.    Review of Systems As above    Objective:   Physical Exam  Constitutional: She is oriented to person, place, and time. She appears well-developed and well-nourished.  Cardiovascular: Normal rate, regular rhythm and normal heart sounds.   No murmur heard. Pulmonary/Chest: Effort normal and breath sounds normal. No respiratory distress. She has no wheezes. She has no rales. She exhibits no tenderness.  Neurological: She is alert and oriented to person, place, and time.  Psychiatric: She has a normal mood and affect. Her behavior is normal. Judgment and thought content normal.          Assessment & Plan:

## 2011-01-03 NOTE — Assessment & Plan Note (Signed)
Refill meds Symptoms stable meds refilled

## 2011-02-14 ENCOUNTER — Other Ambulatory Visit: Payer: Self-pay | Admitting: Family Medicine

## 2011-02-15 ENCOUNTER — Other Ambulatory Visit: Payer: Self-pay | Admitting: Family Medicine

## 2011-02-15 DIAGNOSIS — F419 Anxiety disorder, unspecified: Secondary | ICD-10-CM

## 2011-02-15 MED ORDER — LORAZEPAM 0.5 MG PO TABS: 0.5000 mg | ORAL_TABLET | Freq: Every day | ORAL | Status: DC

## 2011-02-15 NOTE — Telephone Encounter (Signed)
Last seen and filled 01/03/11 please advise     KP 

## 2011-02-15 NOTE — Telephone Encounter (Signed)
Last seen and filled 01/03/11 please advise     KP

## 2011-02-15 NOTE — Telephone Encounter (Signed)
Printed and faxed  KP 

## 2011-03-11 ENCOUNTER — Other Ambulatory Visit: Payer: Self-pay | Admitting: Family Medicine

## 2011-03-17 ENCOUNTER — Other Ambulatory Visit: Payer: Self-pay | Admitting: Family Medicine

## 2011-05-12 ENCOUNTER — Telehealth: Payer: Self-pay | Admitting: *Deleted

## 2011-05-12 DIAGNOSIS — F419 Anxiety disorder, unspecified: Secondary | ICD-10-CM

## 2011-05-12 MED ORDER — LORAZEPAM 0.5 MG PO TABS: 0.5000 mg | ORAL_TABLET | Freq: Every day | ORAL | Status: DC

## 2011-05-12 NOTE — Telephone Encounter (Signed)
Agree 

## 2011-05-12 NOTE — Telephone Encounter (Signed)
Last OV 01-03-11 last refill 04-12-11 #30 no refills for ativan 0.5mg  1 QD, noted [facility] call per fax not coming through per need verbal order, delegated per MD Tabori, #30 NO REFILLS for Ativan 0.5mg  1 tablet QD in adbsence of MD Lowne, pharmacy tech understood order

## 2011-06-02 ENCOUNTER — Ambulatory Visit: Payer: BC Managed Care – PPO | Admitting: Family Medicine

## 2011-06-06 ENCOUNTER — Encounter: Payer: Self-pay | Admitting: Family Medicine

## 2011-06-06 ENCOUNTER — Ambulatory Visit (INDEPENDENT_AMBULATORY_CARE_PROVIDER_SITE_OTHER): Payer: BC Managed Care – PPO | Admitting: Family Medicine

## 2011-06-06 VITALS — BP 110/70 | HR 70 | Temp 98.8°F | Wt 227.4 lb

## 2011-06-06 DIAGNOSIS — F419 Anxiety disorder, unspecified: Secondary | ICD-10-CM

## 2011-06-06 DIAGNOSIS — F411 Generalized anxiety disorder: Secondary | ICD-10-CM

## 2011-06-06 MED ORDER — LORAZEPAM 0.5 MG PO TABS: 0.5000 mg | ORAL_TABLET | Freq: Every day | ORAL | Status: DC

## 2011-06-06 MED ORDER — SERTRALINE HCL 100 MG PO TABS: ORAL_TABLET | ORAL | Status: DC

## 2011-06-06 NOTE — Progress Notes (Signed)
  Subjective:     Allison Cox is a 66 y.o. female who presents for follow up of anxiety disorder. Current symptoms: none. She denies current suicidal and homicidal ideation. She complains of the following side effects from the treatment: none.  The following portions of the patient's history were reviewed and updated as appropriate: allergies, current medications, past family history, past medical history, past social history, past surgical history and problem list.    Objective:    BP 110/70  Pulse 70  Temp(Src) 98.8 F (37.1 C) (Oral)  Wt 227 lb 6.4 oz (103.148 kg)  SpO2 97%  General:  alert, cooperative, appears stated age and no distress  Affect/Behavior:  full facial expressions, good grooming, good insight, normal perception, normal reasoning, normal speech pattern and content and normal thought patterns none      Assessment:    Anxiety Disorder - improving    Plan:    Medications: Ativan and Zoloft. Instructed patient to contact office or on-call physician promptly should condition worsen or any new symptoms appear and provided on-call telephone numbers. IF THE PATIENT HAS ANY SUICIDAL OR HOMICIDAL IDEATIONS, CALL THE OFFICE, DISCUSS WITH A SUPPORT MEMBER, OR GO TO THE ER IMMEDIATELY. Patient was agreeable with this plan. Follow up: 6 months.

## 2011-06-06 NOTE — Patient Instructions (Signed)

## 2011-09-08 ENCOUNTER — Encounter: Payer: Self-pay | Admitting: Internal Medicine

## 2011-09-08 ENCOUNTER — Telehealth: Payer: Self-pay | Admitting: Family Medicine

## 2011-09-08 DIAGNOSIS — F419 Anxiety disorder, unspecified: Secondary | ICD-10-CM

## 2011-09-08 MED ORDER — LORAZEPAM 0.5 MG PO TABS: 0.5000 mg | ORAL_TABLET | Freq: Every day | ORAL | Status: DC

## 2011-09-08 NOTE — Telephone Encounter (Signed)
Refill: Lorazepam 0.5mg  tablet. Take 1 tablet by mouth every day.

## 2011-09-08 NOTE — Telephone Encounter (Signed)
30, no RF 

## 2011-09-08 NOTE — Telephone Encounter (Signed)
Last seen and filled 06/06/11 # 30 with 2 refills. Please advise        KP

## 2011-10-06 ENCOUNTER — Encounter: Payer: Self-pay | Admitting: Family Medicine

## 2011-10-06 ENCOUNTER — Ambulatory Visit (INDEPENDENT_AMBULATORY_CARE_PROVIDER_SITE_OTHER): Payer: BC Managed Care – PPO | Admitting: Family Medicine

## 2011-10-06 VITALS — BP 100/82 | HR 67 | Temp 98.4°F | Wt 252.0 lb

## 2011-10-06 DIAGNOSIS — I10 Essential (primary) hypertension: Secondary | ICD-10-CM

## 2011-10-06 DIAGNOSIS — F419 Anxiety disorder, unspecified: Secondary | ICD-10-CM

## 2011-10-06 DIAGNOSIS — E785 Hyperlipidemia, unspecified: Secondary | ICD-10-CM

## 2011-10-06 DIAGNOSIS — F411 Generalized anxiety disorder: Secondary | ICD-10-CM

## 2011-10-06 LAB — HEPATIC FUNCTION PANEL
ALT: 19 U/L (ref 0–35)
AST: 19 U/L (ref 0–37)
Alkaline Phosphatase: 61 U/L (ref 39–117)
Bilirubin, Direct: 0 mg/dL (ref 0.0–0.3)
Total Bilirubin: 0.4 mg/dL (ref 0.3–1.2)
Total Protein: 7.1 g/dL (ref 6.0–8.3)

## 2011-10-06 LAB — POCT URINALYSIS DIPSTICK
Bilirubin, UA: NEGATIVE
Leukocytes, UA: NEGATIVE
Nitrite, UA: NEGATIVE
pH, UA: 6

## 2011-10-06 LAB — LIPID PANEL
Total CHOL/HDL Ratio: 3
Triglycerides: 79 mg/dL (ref 0.0–149.0)

## 2011-10-06 LAB — LDL CHOLESTEROL, DIRECT: Direct LDL: 125.1 mg/dL

## 2011-10-06 LAB — BASIC METABOLIC PANEL
CO2: 27 mEq/L (ref 19–32)
Chloride: 106 mEq/L (ref 96–112)
Potassium: 4.3 mEq/L (ref 3.5–5.1)
Sodium: 140 mEq/L (ref 135–145)

## 2011-10-06 MED ORDER — LORAZEPAM 0.5 MG PO TABS: 0.5000 mg | ORAL_TABLET | Freq: Every day | ORAL | Status: DC

## 2011-10-06 MED ORDER — SIMVASTATIN 40 MG PO TABS: ORAL_TABLET | ORAL | Status: DC

## 2011-10-06 MED ORDER — SERTRALINE HCL 100 MG PO TABS: ORAL_TABLET | ORAL | Status: DC

## 2011-10-06 NOTE — Patient Instructions (Signed)

## 2011-10-06 NOTE — Progress Notes (Signed)
  Subjective:    Allison Cox is a 66 y.o. female here for follow up of dyslipidemia. The patient does not use medications that may worsen dyslipidemias (corticosteroids, progestins, anabolic steroids, diuretics, beta-blockers, amiodarone, cyclosporine, olanzapine). The patient exercises intermittently. The patient is not known to have coexisting coronary artery disease.   Cardiac Risk Factors Age > 45-female, > 55-female:  YES  +1  Smoking:   NO  Sig. family hx of CHD*:  NO  Hypertension:   NO  Diabetes:   NO  HDL < 35:   NO  HDL > 59:   YES  -1  Total: 0   *- Sig. family h/o CHD per NCEP = MI or sudden death at <55yo in  father or other 1st-degree female relative, or <65yo in mother or  other 1st-degree female relative  The following portions of the patient's history were reviewed and updated as appropriate: allergies, current medications, past family history, past medical history, past social history, past surgical history and problem list.  Review of Systems Pertinent items are noted in HPI.    Objective:    BP 100/82  Pulse 67  Temp(Src) 98.4 F (36.9 C) (Oral)  Wt 252 lb (114.306 kg)  SpO2 97% General appearance: alert, cooperative, appears stated age and no distress Lungs: clear to auscultation bilaterally Heart: S1, S2 normal Extremities: extremities normal, atraumatic, no cyanosis or edema  Lab Review Lab Results  Component Value Date   CHOL 183 09/19/2010   CHOL 264* 07/31/2007   CHOL 263* 04/10/2007   HDL 67.70 09/19/2010   HDL 66.3 07/31/2007   HDL 60.7 04/10/2007   LDLDIRECT 176.7 07/31/2007   LDLDIRECT 180.9 04/10/2007   LDLDIRECT 173.4 01/01/2007      Assessment:    Dyslipidemia as detailed above with 0 CHD risk factors using NCEP scheme above.  Target levels for LDL are: < 100 mg/dl (CHD or "CHD risk equivalent" is present)  Explained to the patient the respective contributions of genetics, diet, and exercise to lipid levels and the use of medication in  severe cases which do not respond to lifestyle alteration. The patient's interest and motivation in making lifestyle changes seems excellent.    Plan:    The following changes are planned for the next 6 months, at which time the patient will return for repeat fasting lipids:  1. Dietary changes: Increase soluble fiber Plant sterols 2grams per day (e.g. Benecol): yes Reduce saturated fat, "trans" monounsaturated fatty acids, and cholesterol 2. Exercise changes:  increase exercise 3. Other treatment: Weight reduction (inc exercise) 4. Lipid-lowering medications: zocor  (Recommended by NCEP after 3-6 mos of dietary therapy & lifestyle modification,  except if CHD is present or LDL well above 190.) 5. Hormone replacement therapy (patient is a postmenopausal  woman): no 6. Screening for secondary causes of dyslipidemias: None indicated 7. Lipid screening for relatives: na 8. Follow up: 6 months.  Note: The majority of the visit was spent in counseling on the pathophysiology and treatment of dyslipidemias. The total face-to-face time was in excess of 25 minutes.

## 2011-10-06 NOTE — Assessment & Plan Note (Signed)
Stable   meds refilled

## 2011-10-11 ENCOUNTER — Other Ambulatory Visit: Payer: Self-pay | Admitting: Family Medicine

## 2011-10-11 DIAGNOSIS — E785 Hyperlipidemia, unspecified: Secondary | ICD-10-CM

## 2011-12-26 ENCOUNTER — Telehealth: Payer: Self-pay | Admitting: Family Medicine

## 2011-12-26 DIAGNOSIS — F419 Anxiety disorder, unspecified: Secondary | ICD-10-CM

## 2011-12-26 MED ORDER — LORAZEPAM 0.5 MG PO TABS: 0.5000 mg | ORAL_TABLET | Freq: Every day | ORAL | Status: DC

## 2011-12-26 NOTE — Telephone Encounter (Signed)
Refill: Lorazepam  0.5mg  tablet. Take 1 tablet by mouth every day. Last fill 11-30-11

## 2011-12-26 NOTE — Telephone Encounter (Signed)
Refill x1 

## 2011-12-26 NOTE — Telephone Encounter (Signed)
Last seen and filled 10/06/11 #30 with 2 refills

## 2012-01-11 ENCOUNTER — Other Ambulatory Visit: Payer: Self-pay | Admitting: Family Medicine

## 2012-01-11 ENCOUNTER — Telehealth: Payer: Self-pay | Admitting: Family Medicine

## 2012-01-11 DIAGNOSIS — Z1231 Encounter for screening mammogram for malignant neoplasm of breast: Secondary | ICD-10-CM

## 2012-01-11 NOTE — Telephone Encounter (Signed)
Please advise      KP 

## 2012-01-11 NOTE — Telephone Encounter (Signed)
Pt would like [place] to put an order for her to get a bone density scan. Call back # [phone]

## 2012-01-18 ENCOUNTER — Ambulatory Visit (INDEPENDENT_AMBULATORY_CARE_PROVIDER_SITE_OTHER): Payer: BC Managed Care – PPO | Admitting: Family Medicine

## 2012-01-18 ENCOUNTER — Encounter: Payer: Self-pay | Admitting: Family Medicine

## 2012-01-18 ENCOUNTER — Ambulatory Visit
Admission: RE | Admit: 2012-01-18 | Discharge: 2012-01-18 | Disposition: A | Discharge status: Home / Self Care | Payer: BC Managed Care – PPO | Source: Ambulatory Visit | Attending: Family Medicine | Admitting: Family Medicine

## 2012-01-18 VITALS — BP 114/68 | HR 69 | Temp 98.4°F | Wt 236.0 lb

## 2012-01-18 DIAGNOSIS — H6590 Unspecified nonsuppurative otitis media, unspecified ear: Secondary | ICD-10-CM

## 2012-01-18 DIAGNOSIS — E785 Hyperlipidemia, unspecified: Secondary | ICD-10-CM

## 2012-01-18 DIAGNOSIS — Z1231 Encounter for screening mammogram for malignant neoplasm of breast: Secondary | ICD-10-CM

## 2012-01-18 DIAGNOSIS — F411 Generalized anxiety disorder: Secondary | ICD-10-CM

## 2012-01-18 DIAGNOSIS — F419 Anxiety disorder, unspecified: Secondary | ICD-10-CM

## 2012-01-18 MED ORDER — AZELASTINE-FLUTICASONE 137-50 MCG/ACT NA SUSP: 1.0000 | Freq: Two times a day (BID) | NASAL | Status: DC

## 2012-01-18 MED ORDER — LORAZEPAM 0.5 MG PO TABS: 0.5000 mg | ORAL_TABLET | Freq: Every day | ORAL | Status: DC

## 2012-01-18 NOTE — Assessment & Plan Note (Signed)
con't meds stable 

## 2012-01-18 NOTE — Progress Notes (Signed)
  Subjective:     Allison Cox is a 67 y.o. female who presents with ear pain and possible ear infection. Symptoms include: plugged sensation in the right ear. Onset of symptoms was 2 months ago, and have been unchanged since that time. Associated symptoms include: none.  Patient denies: achiness, chills, congestion, coryza, fever , headache, low grade fever, non productive cough, post nasal drip, productive cough, sinus pressure, sneezing and sore throat. She is drinking plenty of fluids. Pt had sinus congestion 2 months ago but it cleared.  She now only has pressure in the ear. She is also her for f/u cholesterol and anxiety---she is having no problems with meds and rarely needs ativan. The following portions of the patient's history were reviewed and updated as appropriate: allergies, current medications, past family history, past medical history, past social history, past surgical history and problem list.  Review of Systems Pertinent items are noted in HPI.   Objective:    BP 114/68  Pulse 69  Temp 98.4 F (36.9 C) (Oral)  Wt 236 lb (107.049 kg)  SpO2 98% General:  alert, cooperative, appears stated age and no distress  Right Ear: + fluid.  bulging  Left Ear: normal  Mouth:  lips, mucosa, and tongue normal; teeth and gums normal  Neck: no adenopathy, no carotid bruit, no JVD, supple, symmetrical, trachea midline and thyroid not enlarged, symmetric, no tenderness/mass/nodules   psych-- no depression, anxiety, not suicidal Cor  --+s1s2  Lungs--ctab/l Ext--    No  CCE Assessment:    Right serous otitis Hyperlipidemia--  Fasting labs in Nov Anxiety-- con't zoloft and ativan Plan:    Treatment: No antibiotics indicated at this time. OTC analgesia as needed. Antihistamine and dymista Follow up in 2 weeks if not improving.

## 2012-01-18 NOTE — Patient Instructions (Signed)
Serous Otitis Media   Serous otitis media is also known as otitis media with effusion (OME). It means there is fluid in the middle ear space. This space contains the bones for hearing and air. Air in the middle ear space helps to transmit sound.   The air gets there through the eustachian tube. This tube goes from the back of the throat to the middle ear space. It keeps the pressure in the middle ear the same as the outside world. It also helps to drain fluid from the middle ear space.  CAUSES   OME occurs when the eustachian tube gets blocked. Blockage can come from:   Ear infections.   Colds and other upper respiratory infections.   Allergies.   Irritants such as cigarette smoke.   Sudden changes in air pressure (such as descending in an airplane).   Enlarged adenoids.  During colds and upper respiratory infections, the middle ear space can become temporarily filled with fluid. This can happen after an ear infection also. Once the infection clears, the fluid will generally drain out of the ear through the eustachian tube. If it does not, then OME occurs.  SYMPTOMS    Hearing loss.   A feeling of fullness in the ear - but no pain.   Young children may not show any symptoms.  DIAGNOSIS    Diagnosis of OME is made by an ear exam.   Tests may be done to check on the movement of the eardrum.   Hearing exams may be done.  TREATMENT    The fluid most often goes away without treatment.   If allergy is the cause, allergy treatment may be helpful.   Fluid that persists for several months may require minor surgery. A small tube is placed in the ear drum to:   Drain the fluid.   Restore the air in the middle ear space.   In certain situations, antibiotics are used to avoid surgery.   Surgery may be done to remove enlarged adenoids (if this is the cause).  HOME CARE INSTRUCTIONS    Keep children away from tobacco smoke.   Be sure to keep follow up appointments, if any.  SEEK MEDICAL CARE IF:    Hearing is  not better in 3 months.   Hearing is worse.   Ear pain.   Drainage from the ear.   Dizziness.  Document Released: 07/22/2003 Document Revised: 04/20/2011 Document Reviewed: 05/21/2008  [employer] Patient Information 2012 [employer].

## 2012-01-18 NOTE — Assessment & Plan Note (Signed)
Cont meds Check labs in Nov

## 2012-04-18 ENCOUNTER — Other Ambulatory Visit: Payer: Self-pay | Admitting: Family Medicine

## 2012-04-18 ENCOUNTER — Other Ambulatory Visit (INDEPENDENT_AMBULATORY_CARE_PROVIDER_SITE_OTHER): Payer: Medicare Other

## 2012-04-18 ENCOUNTER — Ambulatory Visit (INDEPENDENT_AMBULATORY_CARE_PROVIDER_SITE_OTHER): Payer: Medicare Other

## 2012-04-18 DIAGNOSIS — Z23 Encounter for immunization: Secondary | ICD-10-CM | POA: Diagnosis not present

## 2012-04-18 DIAGNOSIS — F419 Anxiety disorder, unspecified: Secondary | ICD-10-CM

## 2012-04-18 DIAGNOSIS — E785 Hyperlipidemia, unspecified: Secondary | ICD-10-CM

## 2012-04-18 LAB — HEPATIC FUNCTION PANEL
ALT: 22 U/L (ref 0–35)
AST: 21 U/L (ref 0–37)
Albumin: 4.3 g/dL (ref 3.5–5.2)
Alkaline Phosphatase: 70 U/L (ref 39–117)
Total Bilirubin: 0.3 mg/dL (ref 0.3–1.2)

## 2012-04-18 LAB — LIPID PANEL
Total CHOL/HDL Ratio: 3
Triglycerides: 102 mg/dL (ref 0.0–149.0)

## 2012-04-18 MED ORDER — LORAZEPAM 0.5 MG PO TABS: 0.5000 mg | ORAL_TABLET | Freq: Every day | ORAL | Status: DC

## 2012-04-18 MED ORDER — SIMVASTATIN 40 MG PO TABS: ORAL_TABLET | ORAL | Status: DC

## 2012-04-18 MED ORDER — SERTRALINE HCL 100 MG PO TABS: ORAL_TABLET | ORAL | Status: DC

## 2012-04-18 NOTE — Telephone Encounter (Signed)
OK for  refill of each w/o additional refills

## 2012-04-18 NOTE — Telephone Encounter (Signed)
Pt came in and needs to actually come by to pick up paper copy of of the following:  Lorazepam, simcastatin, and sertraline  She would also like to change her pharm to [facility] on [address]. pls call call when paper copy is ready

## 2012-04-18 NOTE — Telephone Encounter (Signed)
Last seen and filled 01/18/12 # 30 with 2 refills. Please advise      KP

## 2012-04-25 MED ORDER — SIMVASTATIN 40 MG PO TABS: ORAL_TABLET | ORAL | Status: DC

## 2012-04-25 MED ORDER — SERTRALINE HCL 100 MG PO TABS: ORAL_TABLET | ORAL | Status: DC

## 2012-04-25 MED ORDER — LORAZEPAM 0.5 MG PO TABS: 0.5000 mg | ORAL_TABLET | Freq: Every day | ORAL | Status: DC

## 2012-04-25 NOTE — Addendum Note (Signed)
Addended by: Arnette Norris on: 04/25/2012 05:00 PM   Modules accepted: Orders

## 2012-04-25 NOTE — Telephone Encounter (Signed)
Pt called stating that she has changed pharmacy due to insurance and need Rx re-sent in.  Rx sent called and cancel other rx

## 2012-04-25 NOTE — Addendum Note (Signed)
Addended by: Arnette Norris on: 04/25/2012 04:43 PM   Modules accepted: Orders

## 2012-04-25 NOTE — Addendum Note (Signed)
Addended by: Candie Echevaria L on: 04/25/2012 03:56 PM   Modules accepted: Orders

## 2012-05-22 ENCOUNTER — Encounter: Payer: Self-pay | Admitting: Internal Medicine

## 2012-06-03 ENCOUNTER — Other Ambulatory Visit: Payer: Self-pay | Admitting: Family Medicine

## 2012-06-04 NOTE — Telephone Encounter (Signed)
Last seen 01/18/12 and filled 04/25/12 #30. Please advise      KP

## 2012-06-30 ENCOUNTER — Other Ambulatory Visit: Payer: Self-pay

## 2012-07-01 ENCOUNTER — Ambulatory Visit (AMBULATORY_SURGERY_CENTER): Payer: Medicare Other | Admitting: *Deleted

## 2012-07-01 VITALS — Ht 68.0 in | Wt 237.0 lb

## 2012-07-01 DIAGNOSIS — Z1211 Encounter for screening for malignant neoplasm of colon: Secondary | ICD-10-CM

## 2012-07-01 DIAGNOSIS — Z86010 Personal history of colonic polyps: Secondary | ICD-10-CM

## 2012-07-01 MED ORDER — MOVIPREP 100 G PO SOLR: ORAL | Status: DC

## 2012-07-04 ENCOUNTER — Other Ambulatory Visit: Payer: Self-pay | Admitting: Family Medicine

## 2012-07-04 NOTE — Telephone Encounter (Signed)
Last seen 01/18/12 and filled 04/18/12 #30. Please advise     KP

## 2012-07-13 HISTORY — PX: COLONOSCOPY: SHX174

## 2012-07-15 ENCOUNTER — Ambulatory Visit (AMBULATORY_SURGERY_CENTER): Payer: Medicare Other | Admitting: Internal Medicine

## 2012-07-15 ENCOUNTER — Encounter: Payer: Self-pay | Admitting: Internal Medicine

## 2012-07-15 VITALS — BP 131/63 | HR 56 | Temp 97.7°F | Resp 21 | Ht 68.0 in | Wt 237.0 lb

## 2012-07-15 DIAGNOSIS — Z86010 Personal history of colonic polyps: Secondary | ICD-10-CM

## 2012-07-15 DIAGNOSIS — Z1211 Encounter for screening for malignant neoplasm of colon: Secondary | ICD-10-CM | POA: Diagnosis not present

## 2012-07-15 DIAGNOSIS — D126 Benign neoplasm of colon, unspecified: Secondary | ICD-10-CM

## 2012-07-15 MED ORDER — SODIUM CHLORIDE 0.9 % IV SOLN: 500.0000 mL | INTRAVENOUS | Status: DC

## 2012-07-15 NOTE — Progress Notes (Signed)
Pt requested bed pan.  I assisted her with it.  Allison Cox

## 2012-07-15 NOTE — Op Note (Signed)
[facility] [address] [place], [address]   COLONOSCOPY PROCEDURE REPORT  PATIENT: Allison, Cox  MR#: #[id number] BIRTHDATE: 1946/05/10 , 66  yrs. old GENDER: Female ENDOSCOPIST: Roxy Cedar, MD REFERRED XB:[id number] Program Recall PROCEDURE DATE:  07/15/2012 PROCEDURE:   Colonoscopy with snare polypectomy    x 2 ASA CLASS:   Class II INDICATIONS:Patient's personal history of adenomatous colon polyps (2005 w/ HPP; 10-2008 w/ multiple adenomas). MEDICATIONS: MAC sedation, administered by CRNA and propofol (Diprivan) 300mg  IV  DESCRIPTION OF PROCEDURE:   After the risks benefits and alternatives of the procedure were thoroughly explained, informed consent was obtained.  A digital rectal exam revealed no abnormalities of the rectum.   The LB CF-[id number]  endoscope was introduced through the anus and advanced to the cecum, which was identified by both the appendix and ileocecal valve. No adverse events experienced.   The quality of the prep was excellent, using MoviPrep  The instrument was then slowly withdrawn as the colon was fully examined.      COLON FINDINGS: Two polyps ranging between 3-43mm in size were found in the transverse colon.  A polypectomy was performed with a cold snare.  The resection was complete and the polyp tissue was completely retrieved.   The colon mucosa was otherwise normal. Retroflexed views revealed hypertrophic anal papilla. The time to cecum=3 minutes 03 seconds.  Withdrawal time=9 minutes 25 seconds. The scope was withdrawn and the procedure completed. COMPLICATIONS: There were no complications.  ENDOSCOPIC IMPRESSION: 1.   Two polyps ranging between 3-87mm in size were found in the transverse colon; polypectomy was performed with a cold snare 2.   The colon mucosa was otherwise normal  RECOMMENDATIONS: 1. Follow up colonoscopy in 5 years   eSigned:  Roxy Cedar, MD 07/15/2012 10:14 AM   cc:  Lelon Perla, DO and The Patient   PATIENT NAME:  Allison, Cox MR#: #[id number]

## 2012-07-15 NOTE — Patient Instructions (Addendum)
Handouts were given to your care partner on polyps.  You may resume your current medications today.  Please call if any questions or concerns.    YOU HAD AN ENDOSCOPIC PROCEDURE TODAY AT THE [facility]: Refer to the procedure report that was given to you for any specific questions about what was found during the examination.  If the procedure report does not answer your questions, please call your gastroenterologist to clarify.  If you requested that your care partner not be given the details of your procedure findings, then the procedure report has been included in a sealed envelope for you to review at your convenience later.  YOU SHOULD EXPECT: Some feelings of bloating in the abdomen. Passage of more gas than usual.  Walking can help get rid of the air that was put into your GI tract during the procedure and reduce the bloating. If you had a lower endoscopy (such as a colonoscopy or flexible sigmoidoscopy) you may notice spotting of blood in your stool or on the toilet paper. If you underwent a bowel prep for your procedure, then you may not have a normal bowel movement for a few days.  DIET: Your first meal following the procedure should be a light meal and then it is ok to progress to your normal diet.  A half-sandwich or bowl of soup is an example of a good first meal.  Heavy or fried foods are harder to digest and may make you feel nauseous or bloated.  Likewise meals heavy in dairy and vegetables can cause extra gas to form and this can also increase the bloating.  Drink plenty of fluids but you should avoid alcoholic beverages for 24 hours.  ACTIVITY: Your care partner should take you home directly after the procedure.  You should plan to take it easy, moving slowly for the rest of the day.  You can resume normal activity the day after the procedure however you should NOT DRIVE or use heavy machinery for 24 hours (because of the sedation medicines used during the test).     SYMPTOMS TO REPORT IMMEDIATELY: A gastroenterologist can be reached at any hour.  During normal business hours, 8:30 AM to 5:00 PM Monday through Friday, call [phone].  After hours and on weekends, please call the GI answering service at [phone] who will take a message and have the physician on call contact you.   Following lower endoscopy (colonoscopy or flexible sigmoidoscopy):  Excessive amounts of blood in the stool  Significant tenderness or worsening of abdominal pains  Swelling of the abdomen that is new, acute  Fever of 100F or higher    FOLLOW UP: If any biopsies were taken you will be contacted by phone or by letter within the next 1-3 weeks.  Call your gastroenterologist if you have not heard about the biopsies in 3 weeks.  Our staff will call the home number listed on your records the next business day following your procedure to check on you and address any questions or concerns that you may have at that time regarding the information given to you following your procedure. This is a courtesy call and so if there is no answer at the home number and we have not heard from you through the emergency physician on call, we will assume that you have returned to your regular daily activities without incident.  SIGNATURES/CONFIDENTIALITY: You and/or your care partner have signed paperwork which will be entered into your electronic medical record.  These signatures attest to the fact that that the information above on your After Visit Summary has been reviewed and is understood.  Full responsibility of the confidentiality of this discharge information lies with you and/or your care-partner.

## 2012-07-15 NOTE — Progress Notes (Signed)
No complaints noted in the recovery room. Maw  Patient did not experience any of the following events: a burn prior to discharge; a fall within the facility; wrong site/side/patient/procedure/implant event; or a hospital transfer or hospital admission upon discharge from the facility. (G8907) Patient did not have preoperative order for IV antibiotic SSI prophylaxis. (G8918)  

## 2012-07-15 NOTE — Progress Notes (Signed)
Called to room to assist during endoscopic procedure.  Patient ID and intended procedure confirmed with present staff. Received instructions for my participation in the procedure from the performing physician.  

## 2012-07-16 ENCOUNTER — Telehealth: Payer: Self-pay | Admitting: *Deleted

## 2012-07-16 NOTE — Telephone Encounter (Signed)
  Follow up Call-  Call back number 07/15/2012  Post procedure Call Back phone  # [phone]  Permission to leave phone message Yes     Patient questions:  Do you have a fever, pain , or abdominal swelling? no Pain Score  0 *  Have you tolerated food without any problems? yes  Have you been able to return to your normal activities? yes  Do you have any questions about your discharge instructions: Diet   no Medications  no Follow up visit  no  Do you have questions or concerns about your Care? no  Actions: * If pain score is 4 or above: No action needed, pain <4.

## 2012-07-22 ENCOUNTER — Encounter: Payer: Self-pay | Admitting: Internal Medicine

## 2012-07-31 ENCOUNTER — Other Ambulatory Visit: Payer: Self-pay | Admitting: Family Medicine

## 2012-08-01 NOTE — Telephone Encounter (Signed)
Left Pt VM to return call. Call was to inform Pt of control substance agreement and that Rx will need to be pickup.

## 2012-08-01 NOTE — Telephone Encounter (Signed)
Last seen 01/18/12 and filled 07/04/12 #30. Please advise    KP

## 2012-08-13 DIAGNOSIS — Z79899 Other long term (current) drug therapy: Secondary | ICD-10-CM | POA: Diagnosis not present

## 2012-08-13 NOTE — Telephone Encounter (Signed)
Pt came in to office and signed agreement on 08-02-12

## 2012-09-09 ENCOUNTER — Other Ambulatory Visit: Payer: Self-pay | Admitting: Family Medicine

## 2012-09-09 NOTE — Telephone Encounter (Signed)
Last seen 01/18/12 and filled 07/31/12 #30. Please advise     KP

## 2012-09-12 ENCOUNTER — Encounter: Payer: Self-pay | Admitting: Family Medicine

## 2012-10-12 ENCOUNTER — Other Ambulatory Visit: Payer: Self-pay | Admitting: Family Medicine

## 2012-10-14 NOTE — Telephone Encounter (Signed)
Last seen 01/18/12 and filled 09/09/12 #30. Please advise      KP

## 2012-10-17 ENCOUNTER — Telehealth: Payer: Self-pay | Admitting: Family Medicine

## 2012-10-17 DIAGNOSIS — E785 Hyperlipidemia, unspecified: Secondary | ICD-10-CM

## 2012-10-17 NOTE — Telephone Encounter (Signed)
Patient scheduled lab and follow up appointment with Dr. Laury Axon

## 2012-10-17 NOTE — Telephone Encounter (Signed)
Patient states that she needs labs for cholesterol and antidepressant medication and follow up appointment. When does patient need to be seen and if so what labs? Thanks!

## 2012-10-17 NOTE — Telephone Encounter (Signed)
Orders are in and she needs to be seen asap. Thanks     KP

## 2012-10-24 ENCOUNTER — Other Ambulatory Visit (INDEPENDENT_AMBULATORY_CARE_PROVIDER_SITE_OTHER): Payer: Medicare Other

## 2012-10-24 DIAGNOSIS — E785 Hyperlipidemia, unspecified: Secondary | ICD-10-CM | POA: Diagnosis not present

## 2012-10-24 LAB — LIPID PANEL
HDL: 62.4 mg/dL (ref >39.00)
LDL Cholesterol: 88 mg/dL (ref 0–99)
Total CHOL/HDL Ratio: 3
VLDL: 14.8 mg/dL (ref 0.0–40.0)

## 2012-10-24 LAB — HEPATIC FUNCTION PANEL
ALT: 16 U/L (ref 0–35)
Total Bilirubin: 0.4 mg/dL (ref 0.3–1.2)

## 2012-10-29 ENCOUNTER — Ambulatory Visit (INDEPENDENT_AMBULATORY_CARE_PROVIDER_SITE_OTHER): Payer: Medicare Other | Admitting: Family Medicine

## 2012-10-29 ENCOUNTER — Encounter: Payer: Self-pay | Admitting: Family Medicine

## 2012-10-29 VITALS — BP 120/64 | HR 74 | Temp 98.8°F | Wt 233.8 lb

## 2012-10-29 DIAGNOSIS — F411 Generalized anxiety disorder: Secondary | ICD-10-CM | POA: Diagnosis not present

## 2012-10-29 DIAGNOSIS — F419 Anxiety disorder, unspecified: Secondary | ICD-10-CM

## 2012-10-29 DIAGNOSIS — E785 Hyperlipidemia, unspecified: Secondary | ICD-10-CM | POA: Diagnosis not present

## 2012-10-29 MED ORDER — LORAZEPAM 0.5 MG PO TABS: ORAL_TABLET | ORAL | Status: DC

## 2012-10-29 MED ORDER — SIMVASTATIN 40 MG PO TABS: ORAL_TABLET | ORAL | Status: DC

## 2012-10-29 MED ORDER — SERTRALINE HCL 100 MG PO TABS: ORAL_TABLET | ORAL | Status: DC

## 2012-10-29 NOTE — Progress Notes (Signed)
  Subjective:    Allison Cox is a 67 y.o. female here for follow up of dyslipidemia. The patient does not use medications that may worsen dyslipidemias (corticosteroids, progestins, anabolic steroids, diuretics, beta-blockers, amiodarone, cyclosporine, olanzapine). The patient exercises frequently. The patient is not known to have coexisting coronary artery disease.   Cardiac Risk Factors Age > 45-female, > 55-female:  YES  +1  Smoking:   NO  Sig. family hx of CHD*:  NO  Hypertension:   NO  Diabetes:   NO  HDL < 35:   NO  HDL > 59:   YES  -1  Total: 0   *- Sig. family h/o CHD per NCEP = MI or sudden death at <55yo in  father or other 1st-degree female relative, or <65yo in mother or  other 1st-degree female relative  The following portions of the patient's history were reviewed and updated as appropriate: allergies, current medications, past family history, past medical history, past social history, past surgical history and problem list.  Review of Systems Pertinent items are noted in HPI.    Objective:    BP 120/64  Pulse 74  Temp(Src) 98.8 F (37.1 C) (Oral)  Wt 233 lb 12.8 oz (106.051 kg)  BMI 35.56 kg/m2  SpO2 97% General appearance: alert, cooperative, appears stated age and no distress Throat: lips, mucosa, and tongue normal; teeth and gums normal Lungs: clear to auscultation bilaterally Heart: S1, S2 normal Extremities: extremities normal, atraumatic, no cyanosis or edema  Lab Review Lab Results  Component Value Date   CHOL 165 10/24/2012   CHOL 198 04/18/2012   CHOL 203* 10/06/2011   HDL 62.40 10/24/2012   HDL 21.30 04/18/2012   HDL 86.57 10/06/2011   LDLDIRECT 125.1 10/06/2011   LDLDIRECT 176.7 07/31/2007   LDLDIRECT 180.9 04/10/2007      Assessment:    Dyslipidemia as detailed above with 0 CHD risk factors using NCEP scheme above.  Target levels for LDL are: < 100 mg/dl (CHD or "CHD risk equivalent" is present)  Explained to the patient the respective  contributions of genetics, diet, and exercise to lipid levels and the use of medication in severe cases which do not respond to lifestyle alteration. The patient's interest and motivation in making lifestyle changes seems good.    Plan:    The following changes are planned for the next 6 months, at which time the patient will return for repeat fasting lipids:  1. Dietary changes: Increase soluble fiber Plant sterols 2grams per day (e.g. Benecol): yes Reduce saturated fat, "trans" monounsaturated fatty acids, and cholesterol 2. Exercise changes:  pt will increase exercise 3. Other treatment: Weight reduction (-) 4. Lipid-lowering medications: zocor  (Recommended by NCEP after 3-6 mos of dietary therapy & lifestyle modification,  except if CHD is present or LDL well above 190.) 5. Hormone replacement therapy (patient is a postmenopausal  woman): no 6. Screening for secondary causes of dyslipidemias: None indicated 7. Lipid screening for relatives: na 8. Follow up: 6 months.  Note: The majority of the visit was spent in counseling on the pathophysiology and treatment of dyslipidemias. The total face-to-face time was in excess of 20 minutes.

## 2012-10-29 NOTE — Patient Instructions (Signed)

## 2012-12-18 ENCOUNTER — Other Ambulatory Visit: Payer: Self-pay

## 2013-03-06 ENCOUNTER — Other Ambulatory Visit: Payer: Self-pay | Admitting: Family Medicine

## 2013-03-06 NOTE — Telephone Encounter (Signed)
Last seen and filled 10/29/12 #30 with 3 refills.  UDS 08/08/12 low risk , please advise     KP

## 2013-03-20 ENCOUNTER — Other Ambulatory Visit: Payer: Self-pay

## 2013-04-04 ENCOUNTER — Ambulatory Visit (INDEPENDENT_AMBULATORY_CARE_PROVIDER_SITE_OTHER): Payer: Medicare Other | Admitting: Family Medicine

## 2013-04-04 ENCOUNTER — Encounter: Payer: Self-pay | Admitting: Family Medicine

## 2013-04-04 VITALS — BP 118/60 | HR 67 | Temp 98.7°F

## 2013-04-04 DIAGNOSIS — E785 Hyperlipidemia, unspecified: Secondary | ICD-10-CM | POA: Diagnosis not present

## 2013-04-04 DIAGNOSIS — F419 Anxiety disorder, unspecified: Secondary | ICD-10-CM

## 2013-04-04 DIAGNOSIS — F411 Generalized anxiety disorder: Secondary | ICD-10-CM

## 2013-04-04 DIAGNOSIS — Z79899 Other long term (current) drug therapy: Secondary | ICD-10-CM | POA: Diagnosis not present

## 2013-04-04 LAB — BASIC METABOLIC PANEL
CO2: 28 mEq/L (ref 19–32)
GFR: 87.52 mL/min (ref >60.00)
Glucose, Bld: 97 mg/dL (ref 70–99)
Potassium: 4.5 mEq/L (ref 3.5–5.1)
Sodium: 136 mEq/L (ref 135–145)

## 2013-04-04 LAB — LIPID PANEL: VLDL: 20 mg/dL (ref 0.0–40.0)

## 2013-04-04 LAB — HEPATIC FUNCTION PANEL
ALT: 20 U/L (ref 0–35)
AST: 18 U/L (ref 0–37)
Albumin: 4.2 g/dL (ref 3.5–5.2)
Alkaline Phosphatase: 70 U/L (ref 39–117)
Total Protein: 7.4 g/dL (ref 6.0–8.3)

## 2013-04-04 MED ORDER — LORAZEPAM 0.5 MG PO TABS: ORAL_TABLET | ORAL | Status: DC

## 2013-04-04 MED ORDER — SERTRALINE HCL 100 MG PO TABS: ORAL_TABLET | ORAL | Status: DC

## 2013-04-04 NOTE — Patient Instructions (Signed)

## 2013-04-04 NOTE — Progress Notes (Signed)
Pre visit review using our clinic review tool, if applicable. No additional management support is needed unless otherwise documented below in the visit note. 

## 2013-04-06 NOTE — Assessment & Plan Note (Signed)
Stable con't meds 

## 2013-04-06 NOTE — Progress Notes (Signed)
  Subjective:    Patient ID: Allison Cox, female    DOB: 1946/04/06, 67 y.o.   MRN: [id number]  HPI Pt here f/u cholessterol and anxiety.  No new complaints.     Review of Systems As above     Objective:   Physical Exam  BP 118/60  Pulse 67  Temp(Src) 98.7 F (37.1 C) (Oral)  SpO2 96% General appearance: alert, cooperative, appears stated age and no distress Throat: lips, mucosa, and tongue normal; teeth and gums normal Lungs: clear to auscultation bilaterally Heart: S1, S2 normal Psych--no depression, no anxiety      Assessment & Plan:

## 2013-04-06 NOTE — Assessment & Plan Note (Signed)
Check labs con't meds 

## 2013-04-08 ENCOUNTER — Other Ambulatory Visit: Payer: Self-pay

## 2013-04-08 MED ORDER — ATORVASTATIN CALCIUM 20 MG PO TABS: 20.0000 mg | ORAL_TABLET | Freq: Every day | ORAL | Status: DC

## 2013-06-29 ENCOUNTER — Other Ambulatory Visit: Payer: Self-pay | Admitting: Family Medicine

## 2013-06-30 NOTE — Telephone Encounter (Signed)
Last seen and filled 04/04/13 #90. Please advise      KP

## 2013-07-03 ENCOUNTER — Other Ambulatory Visit: Payer: Self-pay | Admitting: Family Medicine

## 2013-07-09 ENCOUNTER — Other Ambulatory Visit: Payer: Self-pay | Admitting: Family Medicine

## 2013-07-09 NOTE — Telephone Encounter (Signed)
Rx faxed  [place] per Dr.Lowne   KP

## 2013-10-02 ENCOUNTER — Ambulatory Visit (INDEPENDENT_AMBULATORY_CARE_PROVIDER_SITE_OTHER): Payer: Medicare Other | Admitting: Family Medicine

## 2013-10-02 ENCOUNTER — Encounter: Payer: Self-pay | Admitting: Family Medicine

## 2013-10-02 VITALS — BP 114/76 | HR 68 | Temp 98.3°F | Wt 249.0 lb

## 2013-10-02 DIAGNOSIS — Z23 Encounter for immunization: Secondary | ICD-10-CM

## 2013-10-02 DIAGNOSIS — Z1231 Encounter for screening mammogram for malignant neoplasm of breast: Secondary | ICD-10-CM

## 2013-10-02 DIAGNOSIS — F411 Generalized anxiety disorder: Secondary | ICD-10-CM

## 2013-10-02 DIAGNOSIS — E785 Hyperlipidemia, unspecified: Secondary | ICD-10-CM | POA: Diagnosis not present

## 2013-10-02 DIAGNOSIS — E669 Obesity, unspecified: Secondary | ICD-10-CM | POA: Insufficient documentation

## 2013-10-02 DIAGNOSIS — E2839 Other primary ovarian failure: Secondary | ICD-10-CM

## 2013-10-02 DIAGNOSIS — Z1239 Encounter for other screening for malignant neoplasm of breast: Secondary | ICD-10-CM

## 2013-10-02 DIAGNOSIS — F419 Anxiety disorder, unspecified: Secondary | ICD-10-CM

## 2013-10-02 LAB — LIPID PANEL
CHOL/HDL RATIO: 3
Cholesterol: 184 mg/dL (ref 0–200)
HDL: 65.8 mg/dL (ref >39.00)
LDL Cholesterol: 97 mg/dL (ref 0–99)
Triglycerides: 108 mg/dL (ref 0.0–149.0)
VLDL: 21.6 mg/dL (ref 0.0–40.0)

## 2013-10-02 LAB — HEPATIC FUNCTION PANEL
ALBUMIN: 4.1 g/dL (ref 3.5–5.2)
ALK PHOS: 66 U/L (ref 39–117)
ALT: 17 U/L (ref 0–35)
AST: 15 U/L (ref 0–37)
BILIRUBIN DIRECT: 0 mg/dL (ref 0.0–0.3)
Total Bilirubin: 0.3 mg/dL (ref 0.2–1.2)
Total Protein: 7.2 g/dL (ref 6.0–8.3)

## 2013-10-02 LAB — BASIC METABOLIC PANEL
BUN: 16 mg/dL (ref 6–23)
CALCIUM: 9.2 mg/dL (ref 8.4–10.5)
CO2: 29 mEq/L (ref 19–32)
Chloride: 104 mEq/L (ref 96–112)
Creatinine, Ser: 0.7 mg/dL (ref 0.4–1.2)
GFR: 90.32 mL/min (ref >60.00)
Glucose, Bld: 95 mg/dL (ref 70–99)
Potassium: 4.3 mEq/L (ref 3.5–5.1)
Sodium: 140 mEq/L (ref 135–145)

## 2013-10-02 MED ORDER — LORAZEPAM 0.5 MG PO TABS: ORAL_TABLET | ORAL | Status: DC

## 2013-10-02 MED ORDER — ATORVASTATIN CALCIUM 20 MG PO TABS: 20.0000 mg | ORAL_TABLET | Freq: Every day | ORAL | Status: DC

## 2013-10-02 MED ORDER — SERTRALINE HCL 100 MG PO TABS: ORAL_TABLET | ORAL | Status: DC

## 2013-10-02 NOTE — Progress Notes (Signed)
Pre visit review using our clinic review tool, if applicable. No additional management support is needed unless otherwise documented below in the visit note. 

## 2013-10-02 NOTE — Patient Instructions (Signed)

## 2013-10-02 NOTE — Progress Notes (Signed)
   Subjective:    Patient ID: Allison Cox, female    DOB: [birth year], 68 y.o.   MRN: [id number]  HPI Pt here for f/u anxiety and cholesterol.  No complaints   Review of Systems As above    Objective:   Physical Exam  BP 114/76  Pulse 68  Temp(Src) 98.3 F (36.8 C) (Oral)  Wt 249 lb (112.946 kg)  SpO2 95% General appearance: alert, cooperative and no distress Throat: lips, mucosa, and tongue normal; teeth and gums normal Neck: no adenopathy, supple, symmetrical, trachea midline and thyroid not enlarged, symmetric, no tenderness/mass/nodules Lungs: clear to auscultation bilaterally Heart: regular rate and rhythm, S1, S2 normal, no murmur, click, rub or gallop Extremities: extremities normal, atraumatic, no cyanosis or edema      Assessment & Plan:  1. Need for pneumococcal vaccination  - Pneumococcal polysaccharide vaccine 23-valent greater than or equal to 2yo subcutaneous/IM  2. Anxiety stable - sertraline (ZOLOFT) 100 MG tablet; 1 1/2 tab po qd  Dispense: 135 tablet; Refill: 3 - LORazepam (ATIVAN) 0.5 MG tablet; TAKE ONE TABLET BY MOUTH ONCE DAILY  Dispense: 90 tablet; Refill: 1  3. Other and unspecified hyperlipidemia con't meds, check labs - atorvastatin (LIPITOR) 20 MG tablet; Take 1 tablet (20 mg total) by mouth daily.  Dispense: 90 tablet; Refill: 3 - Basic metabolic panel - Hepatic function panel - Lipid panel  4. Estrogen deficiency  - DG Bone Density; Future  5. Other screening breast examination  - MM DIGITAL SCREENING BILATERAL; Future  6. Encounter for screening mammogram for malignant neoplasm of breast   - MM DIGITAL SCREENING BILATERAL; Future

## 2013-12-02 DIAGNOSIS — M722 Plantar fascial fibromatosis: Secondary | ICD-10-CM | POA: Diagnosis not present

## 2013-12-02 DIAGNOSIS — M7730 Calcaneal spur, unspecified foot: Secondary | ICD-10-CM | POA: Diagnosis not present

## 2013-12-02 DIAGNOSIS — M7150 Other bursitis, not elsewhere classified, unspecified site: Secondary | ICD-10-CM | POA: Diagnosis not present

## 2014-02-19 ENCOUNTER — Encounter: Payer: Self-pay | Admitting: Medical

## 2014-02-19 ENCOUNTER — Telehealth: Payer: Self-pay

## 2014-02-19 ENCOUNTER — Ambulatory Visit (HOSPITAL_BASED_OUTPATIENT_CLINIC_OR_DEPARTMENT_OTHER)
Admission: RE | Admit: 2014-02-19 | Discharge: 2014-02-19 | Disposition: A | Discharge status: Home / Self Care | Payer: Medicare Other | Source: Ambulatory Visit | Attending: Medical | Admitting: Medical

## 2014-02-19 ENCOUNTER — Ambulatory Visit (INDEPENDENT_AMBULATORY_CARE_PROVIDER_SITE_OTHER): Payer: Medicare Other | Admitting: Medical

## 2014-02-19 ENCOUNTER — Encounter (HOSPITAL_BASED_OUTPATIENT_CLINIC_OR_DEPARTMENT_OTHER): Payer: Self-pay

## 2014-02-19 VITALS — BP 128/70 | HR 70 | Temp 98.5°F | Ht 69.0 in | Wt 241.4 lb

## 2014-02-19 DIAGNOSIS — Z79899 Other long term (current) drug therapy: Secondary | ICD-10-CM | POA: Diagnosis not present

## 2014-02-19 DIAGNOSIS — R109 Unspecified abdominal pain: Secondary | ICD-10-CM | POA: Insufficient documentation

## 2014-02-19 DIAGNOSIS — R1084 Generalized abdominal pain: Secondary | ICD-10-CM

## 2014-02-19 DIAGNOSIS — N390 Urinary tract infection, site not specified: Secondary | ICD-10-CM

## 2014-02-19 DIAGNOSIS — R1031 Right lower quadrant pain: Secondary | ICD-10-CM | POA: Diagnosis not present

## 2014-02-19 DIAGNOSIS — A09 Infectious gastroenteritis and colitis, unspecified: Secondary | ICD-10-CM | POA: Diagnosis not present

## 2014-02-19 DIAGNOSIS — R82998 Other abnormal findings in urine: Secondary | ICD-10-CM

## 2014-02-19 LAB — CBC WITH DIFFERENTIAL/PLATELET
BASOS ABS: 0 10*3/uL (ref 0.0–0.1)
Basophils Relative: 0.6 % (ref 0.0–3.0)
Eosinophils Absolute: 0.1 10*3/uL (ref 0.0–0.7)
Eosinophils Relative: 1.4 % (ref 0.0–5.0)
HCT: 38.3 % (ref 36.0–46.0)
Hemoglobin: 13.2 g/dL (ref 12.0–15.0)
Lymphocytes Relative: 14.5 % (ref 12.0–46.0)
Lymphs Abs: 0.7 10*3/uL (ref 0.7–4.0)
MCHC: 34.4 g/dL (ref 30.0–36.0)
MCV: 87.3 fl (ref 78.0–100.0)
MONO ABS: 0.4 10*3/uL (ref 0.1–1.0)
Monocytes Relative: 6.9 % (ref 3.0–12.0)
NEUTROS PCT: 76.6 % (ref 43.0–77.0)
Neutro Abs: 3.9 10*3/uL (ref 1.4–7.7)
PLATELETS: 223 10*3/uL (ref 150.0–400.0)
RBC: 4.39 Mil/uL (ref 3.87–5.11)
RDW: 14 % (ref 11.5–15.5)
WBC: 5.1 10*3/uL (ref 4.0–10.5)

## 2014-02-19 LAB — COMPREHENSIVE METABOLIC PANEL
ALBUMIN: 3.8 g/dL (ref 3.5–5.2)
ALK PHOS: 72 U/L (ref 39–117)
ALT: 14 U/L (ref 0–35)
AST: 16 U/L (ref 0–37)
BUN: 17 mg/dL (ref 6–23)
CO2: 28 mEq/L (ref 19–32)
Calcium: 9.1 mg/dL (ref 8.4–10.5)
Chloride: 102 mEq/L (ref 96–112)
Creatinine, Ser: 0.7 mg/dL (ref 0.4–1.2)
GFR: 85.89 mL/min (ref >60.00)
GLUCOSE: 102 mg/dL — AB (ref 70–99)
Potassium: 4.4 mEq/L (ref 3.5–5.1)
SODIUM: 138 meq/L (ref 135–145)
TOTAL PROTEIN: 7.8 g/dL (ref 6.0–8.3)
Total Bilirubin: 0.4 mg/dL (ref 0.2–1.2)

## 2014-02-19 LAB — POCT URINALYSIS DIPSTICK
Blood, UA: NEGATIVE
Glucose, UA: NEGATIVE
Ketones, UA: NEGATIVE
Nitrite, UA: NEGATIVE
PH UA: 5
SPEC GRAV UA: 1.02
Urobilinogen, UA: 0.2

## 2014-02-19 LAB — LIPASE: Lipase: 26 U/L (ref 11.0–59.0)

## 2014-02-19 MED ORDER — IOHEXOL 300 MG/ML  SOLN
100.0000 mL | Freq: Once | INTRAMUSCULAR | Status: AC | PRN
  Administered 2014-02-19: 100 mL via INTRAVENOUS

## 2014-02-19 MED ORDER — ONDANSETRON 8 MG PO TBDP: 8.0000 mg | ORAL_TABLET | Freq: Three times a day (TID) | ORAL | Status: DC | PRN

## 2014-02-19 NOTE — Progress Notes (Signed)
Pre visit review using our clinic review tool, if applicable. No additional management support is needed unless otherwise documented below in the visit note. 

## 2014-02-19 NOTE — Progress Notes (Signed)
Subjective:    Patient ID: Allison Cox, female    DOB: 1946/02/05, 68 y.o.   MRN: [id number]  HPI  Pt in with some mid abdominal pain but some pain in the rt side of her abdomen. Pt states this pain is going on for 6 months on and off. But states pain is becoming more frequent and intense. Occasionally will wake at 2 am or so and have pain then will vomit. Pt has gallbladder and appendix.  Occasionally she will have bouts of diarrhea.(But non recent) Not every day but occurs occasionally last six months. Pt states she has had colonoscopies. Only polyps found. No diverticulosis.  Past Medical History  Diagnosis Date  . Anxiety   . Hyperlipidemia   . Osteopenia   . Colon polyps     hyperplastic and Adenomatous    History   Social History  . Marital Status: Single    Spouse Name: N/A    Number of Children: N/A  . Years of Education: N/A   Occupational History  . Not on file.   Social History Main Topics  . Smoking status: Former Smoker    Quit date: 05/15/1986  . Smokeless tobacco: Never Used  . Alcohol Use: 1.2 oz/week    2 Glasses of wine per week  . Drug Use: No  . Sexual Activity: Not Currently    Partners: Male   Other Topics Concern  . Not on file   Social History Narrative  . No narrative on file    Past Surgical History  Procedure Laterality Date  . Carpal tunnel release    . Knee arthroscopy    . Colonoscopy      Family History  Problem Relation Age of Onset  . Coronary artery disease    . Colon cancer Neg Hx     Allergies  Allergen Reactions  . Codeine Nausea And Vomiting    Current Outpatient Prescriptions on File Prior to Visit  Medication Sig Dispense Refill  . atorvastatin (LIPITOR) 20 MG tablet Take 1 tablet (20 mg total) by mouth daily.  90 tablet  3  . LORazepam (ATIVAN) 0.5 MG tablet TAKE ONE TABLET BY MOUTH ONCE DAILY  90 tablet  1  . sertraline (ZOLOFT) 100 MG tablet 1 1/2 tab po qd  135 tablet  3   No current  facility-administered medications on file prior to visit.    BP 128/70  Pulse 70  Temp(Src) 98.5 F (36.9 C) (Oral)  Ht 5\' 9"  (1.753 m)  Wt 241 lb 6.4 oz (109.498 kg)  BMI 35.63 kg/m2  SpO2 96%      Review of Systems  Constitutional: Negative for fever, chills and fatigue.  HENT: Negative.   Respiratory: Negative for cough, chest tightness, shortness of breath and wheezing.   Cardiovascular: Negative for chest pain and palpitations.  Gastrointestinal: Positive for nausea, vomiting and abdominal pain. Negative for diarrhea, constipation, blood in stool, abdominal distention, anal bleeding and rectal pain.       N and V this am not presently.  Generalized for 6 months but now more rlq/rt side abdomen  Genitourinary: Negative.   Musculoskeletal: Positive for back pain.       None presently but occasinally some rt cva region pain.  Neurological: Negative.   Hematological: Negative for adenopathy. Does not bruise/bleed easily.  Psychiatric/Behavioral: Negative.        Objective:   Physical Exam  General Appearance- Not in acute distress.  HEENT Eyes- Scleraeral/Conjuntiva-bilat- Not  Yellow. Mouth & Throat- Normal.  Chest and Lung Exam Auscultation: Breath sounds:-Normal. Adventitious sounds:- No Adventitious sounds.CTA.  Cardiovascular Auscultation:Rythm - Regular. Heart Sounds -Normal heart sounds. RRR  Abdomen Inspection:-Inspection Normal.  Palpation- Palpation of the abdomen reveal- Tender moderate directly over rt lower quadrant, No Rebound tenderness, No rigidity(Guarding) and No Palpable abdominal masses.  Liver:-Normal.  Spleen:- Normal.    Back- no cva tenderness.          Assessment & Plan:  Zantac can start after study.

## 2014-02-19 NOTE — Patient Instructions (Addendum)
For your generalized abdomen pain over past 6 month that is now more prominent in th rlq, we will get stat labs and do ct abdomen/pelvis stat today  to evaluate your appendix. After test done you could start zofran rx and zantac otc.   We will discuss results of test after I get results. Please stay in radiology until I am notified of the results and discuss results with you by phone.

## 2014-02-19 NOTE — Assessment & Plan Note (Addendum)
Originally diffuse 6 months ago. Now getting more on the right side and on exam directly over rt lower quadrant area. Will get stat cbc,cmpp, lipase and later today stat ct abd/pelvis with contrast. I asked pt to wait in radiology until I am called with results and then will discuss results with her. Also we are sending out urine for culture. She had some trace leukocytes.  Note would consider surgeon evaluation(of appendix) if pain increases to high level  despite a negative study.

## 2014-02-20 ENCOUNTER — Other Ambulatory Visit: Payer: Self-pay

## 2014-02-20 LAB — URINE CULTURE

## 2014-02-20 MED ORDER — CIPROFLOXACIN HCL 500 MG PO TABS: 500.0000 mg | ORAL_TABLET | Freq: Two times a day (BID) | ORAL | Status: DC

## 2014-02-20 NOTE — Telephone Encounter (Signed)
UC results not back at this time.

## 2014-03-11 ENCOUNTER — Encounter: Payer: Self-pay | Admitting: Family Medicine

## 2014-03-31 ENCOUNTER — Telehealth: Payer: Self-pay

## 2014-03-31 DIAGNOSIS — F419 Anxiety disorder, unspecified: Secondary | ICD-10-CM

## 2014-03-31 MED ORDER — LORAZEPAM 0.5 MG PO TABS: ORAL_TABLET | ORAL | Status: DC

## 2014-03-31 NOTE — Telephone Encounter (Signed)
Last seen 02/19/14 and filled 10/02/13 #90 with 1 refill.   Please advise     KP

## 2014-03-31 NOTE — Telephone Encounter (Signed)
Refill x1 

## 2014-03-31 NOTE — Telephone Encounter (Signed)
Rx Faxed    KP 

## 2014-04-14 ENCOUNTER — Telehealth: Payer: Self-pay | Admitting: Family Medicine

## 2014-04-14 NOTE — Telephone Encounter (Signed)
Called pharmacy and they confirmed that the Rx was there waiting for the patient to pick it up, I made the patient aware and she voiced understanding.      KP

## 2014-04-14 NOTE — Telephone Encounter (Signed)
Caller name: [name], [name] Relation to KG:YJEH Call back [phone] Pharmacy:[facility]  Reason for call: pt is needing rx   LORazepam (ATIVAN) 0.5 MG tablet

## 2014-04-21 ENCOUNTER — Telehealth: Payer: Self-pay

## 2014-04-21 NOTE — Telephone Encounter (Signed)
Too soon

## 2014-04-21 NOTE — Telephone Encounter (Signed)
Last seen 02/19/14 and filled 03/31/14 #90.  Please advise    KP

## 2014-07-22 ENCOUNTER — Telehealth: Payer: Self-pay

## 2014-07-22 DIAGNOSIS — F419 Anxiety disorder, unspecified: Secondary | ICD-10-CM

## 2014-07-22 NOTE — Telephone Encounter (Signed)
Received request from [facility].  Pt requesting a refill on Ativan 0.5 mg tablets.  Last Filled: 03/31/14 Amt: 90, 0 refills Last OV:  10/02/13 (with Dr. Etter Sjogren) No contract UDS-LOW risk  Please advise.

## 2014-07-23 NOTE — Telephone Encounter (Signed)
Ok for #30, no refills 

## 2014-07-24 MED ORDER — LORAZEPAM 0.5 MG PO TABS
ORAL_TABLET | ORAL | Status: DC
Start: 2014-07-24 — End: 2014-08-12

## 2014-07-24 NOTE — Telephone Encounter (Signed)
Rx printed and placed in Dr. Virgil Benedict red folder for review and signature.

## 2014-08-12 MED ORDER — LORAZEPAM 0.5 MG PO TABS
ORAL_TABLET | ORAL | Status: DC
Start: 2014-08-12 — End: 2014-09-15

## 2014-08-12 NOTE — Telephone Encounter (Signed)
Med refilled and faxed today.

## 2014-08-12 NOTE — Telephone Encounter (Signed)
Pt is following up, states she has not received the rx yet

## 2014-08-12 NOTE — Addendum Note (Signed)
Addended by: Kris Hartmann on: 08/12/2014 10:33 AM   Modules accepted: Orders

## 2014-09-15 ENCOUNTER — Ambulatory Visit (INDEPENDENT_AMBULATORY_CARE_PROVIDER_SITE_OTHER): Payer: Medicare Other | Admitting: Family Medicine

## 2014-09-15 ENCOUNTER — Encounter: Payer: Self-pay | Admitting: Family Medicine

## 2014-09-15 VITALS — BP 118/72 | HR 66 | Temp 98.1°F | Wt 244.2 lb

## 2014-09-15 DIAGNOSIS — E2839 Other primary ovarian failure: Secondary | ICD-10-CM

## 2014-09-15 DIAGNOSIS — F419 Anxiety disorder, unspecified: Secondary | ICD-10-CM

## 2014-09-15 DIAGNOSIS — E785 Hyperlipidemia, unspecified: Secondary | ICD-10-CM | POA: Diagnosis not present

## 2014-09-15 DIAGNOSIS — Z1231 Encounter for screening mammogram for malignant neoplasm of breast: Secondary | ICD-10-CM | POA: Diagnosis not present

## 2014-09-15 LAB — LIPID PANEL
CHOL/HDL RATIO: 3
Cholesterol: 170 mg/dL (ref 0–200)
HDL: 52.8 mg/dL (ref >39.00)
LDL CALC: 101 mg/dL — AB (ref 0–99)
NONHDL: 117.2
Triglycerides: 83 mg/dL (ref 0.0–149.0)
VLDL: 16.6 mg/dL (ref 0.0–40.0)

## 2014-09-15 LAB — HEPATIC FUNCTION PANEL
ALT: 17 U/L (ref 0–35)
AST: 16 U/L (ref 0–37)
Albumin: 4.2 g/dL (ref 3.5–5.2)
Alkaline Phosphatase: 75 U/L (ref 39–117)
Bilirubin, Direct: 0.1 mg/dL (ref 0.0–0.3)
Total Bilirubin: 0.3 mg/dL (ref 0.2–1.2)
Total Protein: 7.5 g/dL (ref 6.0–8.3)

## 2014-09-15 LAB — BASIC METABOLIC PANEL
BUN: 16 mg/dL (ref 6–23)
CO2: 28 meq/L (ref 19–32)
CREATININE: 0.68 mg/dL (ref 0.40–1.20)
Calcium: 9.2 mg/dL (ref 8.4–10.5)
Chloride: 104 mEq/L (ref 96–112)
GFR: 91.59 mL/min (ref >60.00)
Glucose, Bld: 103 mg/dL — ABNORMAL HIGH (ref 70–99)
POTASSIUM: 4.4 meq/L (ref 3.5–5.1)
SODIUM: 137 meq/L (ref 135–145)

## 2014-09-15 MED ORDER — SERTRALINE HCL 100 MG PO TABS
ORAL_TABLET | ORAL | Status: DC
Start: 2014-09-15 — End: 2015-01-15

## 2014-09-15 MED ORDER — LORAZEPAM 0.5 MG PO TABS: ORAL_TABLET | ORAL | Status: DC

## 2014-09-15 MED ORDER — TETANUS-DIPHTH-ACELL PERTUSSIS 5-2.5-18.5 LF-MCG/0.5 IM SUSP: 0.5000 mL | Freq: Once | INTRAMUSCULAR | Status: DC

## 2014-09-15 MED ORDER — ATORVASTATIN CALCIUM 20 MG PO TABS: 20.0000 mg | ORAL_TABLET | Freq: Every day | ORAL | Status: DC

## 2014-09-15 NOTE — Patient Instructions (Addendum)
Your Bone Density and Mammogram has been scheduled on: October 19, 2014 at 10:40 am Address: [address], [place], [place] [address]  [phone]   Cholesterol Cholesterol is a white, waxy, fat-like substance needed by your body in small amounts. The liver makes all the cholesterol you need. Cholesterol is carried from the liver by the blood through the blood vessels. Deposits of cholesterol (plaque) may build up on blood vessel walls. These make the arteries narrower and stiffer. Cholesterol plaques increase the risk for heart attack and stroke.  You cannot feel your cholesterol level even if it is very high. The only way to know it is high is with a blood test. Once you know your cholesterol levels, you should keep a record of the test results. Work with your health care provider to keep your levels in the desired range.  WHAT DO THE RESULTS MEAN?  Total cholesterol is a rough measure of all the cholesterol in your blood.   LDL is the so-called bad cholesterol. This is the type that deposits cholesterol in the walls of the arteries. You want this level to be low.   HDL is the good cholesterol because it cleans the arteries and carries the LDL away. You want this level to be high.  Triglycerides are fat that the body can either burn for energy or store. High levels are closely linked to heart disease.  WHAT ARE THE DESIRED LEVELS OF CHOLESTEROL?  Total cholesterol below 200.   LDL below 100 for people at risk, below 70 for those at very high risk.   HDL above 50 is good, above 60 is best.   Triglycerides below 150.  HOW CAN I LOWER MY CHOLESTEROL?  Diet. Follow your diet programs as directed by your health care provider.   Choose fish or white meat chicken and [place], roasted or baked. Limit fatty cuts of red meat, fried foods, and processed meats, such as sausage and lunch meats.   Eat lots of fresh fruits and vegetables.  Choose whole grains, beans, pasta,  potatoes, and cereals.   Use only small amounts of olive, corn, or canola oils.   Avoid butter, mayonnaise, shortening, or palm kernel oils.  Avoid foods with trans fats.   Drink skim or nonfat milk and eat low-fat or nonfat yogurt and cheeses. Avoid whole milk, cream, ice cream, egg yolks, and full-fat cheeses.   Healthy desserts include angel food cake, ginger snaps, animal crackers, hard candy, popsicles, and low-fat or nonfat frozen yogurt. Avoid pastries, cakes, pies, and cookies.   Exercise. Follow your exercise programs as directed by your health care provider.   A regular program helps decrease LDL and raise HDL.   A regular program helps with weight control.   Do things that increase your activity level like gardening, walking, or taking the stairs. Ask your health care provider about how you can be more active in your daily life.   Medicine. Take medicine only as directed by your health care provider.   Medicine may be prescribed by your health care provider to help lower cholesterol and decrease the risk for heart disease.   If you have several risk factors, you may need medicine even if your levels are normal. Document Released: 01/24/2001 Document Revised: 09/15/2013 Document Reviewed: 02/12/2013 [facility] Patient Information 2015 [place], [place]. This information is not intended to replace advice given to you by your health care provider. Make sure you discuss any questions you have with your health care  provider.  

## 2014-09-15 NOTE — Progress Notes (Signed)
Pre visit review using our clinic review tool, if applicable. No additional management support is needed unless otherwise documented below in the visit note. 

## 2014-09-15 NOTE — Progress Notes (Signed)
Patient ID: Allison Cox, female    DOB: [birth year]  Age: 69 y.o. MRN: [id number]    Subjective:  Subjective HPI Heela Heishman presents for f/u anxiety and cholesterol.  Pt doing well with meds.  No new complaints.  Review of Systems  Constitutional: Negative for activity change, appetite change, fatigue and unexpected weight change.  HENT: Negative for congestion, ear pain, facial swelling, nosebleeds, rhinorrhea and sinus pressure.   Respiratory: Negative for cough and shortness of breath.   Cardiovascular: Negative for chest pain and palpitations.  Musculoskeletal: Negative for myalgias, joint swelling and arthralgias.  Psychiatric/Behavioral: Negative for behavioral problems and dysphoric mood. The patient is not nervous/anxious.     History Past Medical History  Diagnosis Date  . Anxiety   . Hyperlipidemia   . Osteopenia   . Colon polyps     hyperplastic and Adenomatous    She has past surgical history that includes Carpal tunnel release; Knee arthroscopy; and Colonoscopy.   Her family history includes Coronary artery disease in an other family member. There is no history of Colon cancer.She reports that she quit smoking about 28 years ago. She has never used smokeless tobacco. She reports that she drinks about 1.2 oz of alcohol per week. She reports that she does not use illicit drugs.  No current outpatient prescriptions on file prior to visit.   No current facility-administered medications on file prior to visit.     Objective:  Objective Physical Exam  Constitutional: She is oriented to person, place, and time. She appears well-developed and well-nourished. No distress.  HENT:  Right Ear: External ear normal.  Left Ear: External ear normal.  Nose: Nose normal.  Mouth/Throat: Oropharynx is clear and moist.  Eyes: EOM are normal. Pupils are equal, round, and reactive to light.  Neck: Normal range of motion. Neck supple.  Cardiovascular: Normal rate,  regular rhythm and normal heart sounds.   No murmur heard. Pulmonary/Chest: Effort normal and breath sounds normal. No respiratory distress. She has no wheezes. She has no rales. She exhibits no tenderness.  Neurological: She is alert and oriented to person, place, and time.  Psychiatric: She has a normal mood and affect. Her behavior is normal. Judgment and thought content normal.   BP 118/72 mmHg  Pulse 66  Temp(Src) 98.1 F (36.7 C) (Oral)  Wt 244 lb 3.2 oz (110.768 kg)  SpO2 97% Wt Readings from Last 3 Encounters:  09/15/14 244 lb 3.2 oz (110.768 kg)  02/19/14 241 lb 6.4 oz (109.498 kg)  10/02/13 249 lb (112.946 kg)     Lab Results  Component Value Date   WBC 5.1 02/19/2014   HGB 13.2 02/19/2014   HCT 38.3 02/19/2014   PLT 223.0 02/19/2014   GLUCOSE 102* 02/19/2014   CHOL 184 10/02/2013   TRIG 108.0 10/02/2013   HDL 65.80 10/02/2013   LDLDIRECT 125.1 10/06/2011   LDLCALC 97 10/02/2013   ALT 14 02/19/2014   AST 16 02/19/2014   NA 138 02/19/2014   K 4.4 02/19/2014   CL 102 02/19/2014   CREATININE 0.7 02/19/2014   BUN 17 02/19/2014   CO2 28 02/19/2014   TSH 2.68 08/11/2009    Ct Abdomen Pelvis W Contrast  02/19/2014   CLINICAL DATA:  Right lower quadrant pain increasing over the past 6 months, recently worsening.  EXAM: CT ABDOMEN AND PELVIS WITH CONTRAST  TECHNIQUE: Multidetector CT imaging of the abdomen and pelvis was performed using the standard protocol following bolus administration of intravenous  contrast.  CONTRAST:  142mL OMNIPAQUE IOHEXOL 300 MG/ML  SOLN  COMPARISON:  None.  FINDINGS: BODY WALL: Fatty umbilical hernia.  LOWER CHEST: There is patchy density in the dependent the left lung, morphology favoring atelectasis - especially in the sagittal projection. The finding is not discrete enough to suggest ground-glass nodule. There is lower mediastinal calcified lymph nodes compatible with previous granulomatous disease.  ABDOMEN/PELVIS:  Liver: No focal  abnormality.  Biliary: No evidence of biliary obstruction or stone.  Pancreas: Unremarkable.  Spleen: Unremarkable.  Adrenals: Unremarkable.  Kidneys and ureters: No hydronephrosis or stone.  Bladder: Unremarkable.  Reproductive: Calcifications along the anterior serosal surface of the uterine body is without contour abnormality/ mass and likely incidental.  Bowel: Diffuse colonic fluid levels, reaching the sigmoid colon. There is no bowel wall thickening or bowel obstruction. Negative appendix.  Retroperitoneum: No mass or adenopathy.  Peritoneum: No ascites or pneumoperitoneum.  Vascular: No acute abnormality.  OSSEOUS: No acute abnormalities.  IMPRESSION: 1. Probable diarrheal illness. 2. No appendicitis.   Electronically Signed   By: Jorje Guild M.D.   On: 02/19/2014 15:56     Assessment & Plan:  Plan I have discontinued Ms. Dadamo's ondansetron and ciprofloxacin. I am also having her start on Tdap. Additionally, I am having her maintain her sertraline, atorvastatin, and LORazepam.  Meds ordered this encounter  Medications  . sertraline (ZOLOFT) 100 MG tablet    Sig: 1 1/2 tab po qd    Dispense:  135 tablet    Refill:  3    D/C PREVIOUS SCRIPTS FOR THIS MEDICATION  . atorvastatin (LIPITOR) 20 MG tablet    Sig: Take 1 tablet (20 mg total) by mouth daily.    Dispense:  90 tablet    Refill:  3    D/C PREVIOUS SCRIPTS FOR THIS MEDICATION  . LORazepam (ATIVAN) 0.5 MG tablet    Sig: TAKE ONE TABLET BY MOUTH ONCE DAILY    Dispense:  30 tablet    Refill:  0  . Tdap (BOOSTRIX) 5-2.5-18.5 LF-MCG/0.5 injection    Sig: Inject 0.5 mLs into the muscle once.    Dispense:  0.5 mL    Refill:  0    Problem List Items Addressed This Visit    None    Visit Diagnoses    Anxiety    -  Primary    Relevant Medications    sertraline (ZOLOFT) 100 MG tablet    LORazepam (ATIVAN) 0.5 MG tablet    Hyperlipidemia        Relevant Medications    atorvastatin (LIPITOR) 20 MG tablet    Other Relevant  Orders    Lipid panel    Hepatic function panel    Basic metabolic panel    Basic metabolic panel    Hepatic function panel    Lipid panel    Estrogen deficiency        Relevant Orders    DG Bone Density    Encounter for screening mammogram for breast cancer        Relevant Orders    MM Digital Screening       Follow-up: Return in about 6 months (around 03/18/2015), or if symptoms worsen or fail to improve, for cholesterol.  Garnet Koyanagi, DO

## 2014-10-19 ENCOUNTER — Ambulatory Visit
Admission: RE | Admit: 2014-10-19 | Discharge: 2014-10-19 | Disposition: A | Discharge status: Home / Self Care | Payer: Medicare Other | Source: Ambulatory Visit | Attending: Family Medicine | Admitting: Family Medicine

## 2014-10-19 ENCOUNTER — Other Ambulatory Visit: Payer: Self-pay

## 2014-10-19 DIAGNOSIS — E2839 Other primary ovarian failure: Secondary | ICD-10-CM

## 2014-10-19 DIAGNOSIS — E785 Hyperlipidemia, unspecified: Secondary | ICD-10-CM

## 2014-10-19 DIAGNOSIS — Z780 Asymptomatic menopausal state: Secondary | ICD-10-CM | POA: Diagnosis not present

## 2014-10-19 DIAGNOSIS — Z1231 Encounter for screening mammogram for malignant neoplasm of breast: Secondary | ICD-10-CM

## 2014-10-19 DIAGNOSIS — Z13820 Encounter for screening for osteoporosis: Secondary | ICD-10-CM | POA: Diagnosis not present

## 2014-10-19 NOTE — Telephone Encounter (Signed)
Error.     KP 

## 2014-11-19 ENCOUNTER — Telehealth: Payer: Self-pay

## 2014-11-19 DIAGNOSIS — F419 Anxiety disorder, unspecified: Secondary | ICD-10-CM

## 2014-11-19 MED ORDER — LORAZEPAM 0.5 MG PO TABS: ORAL_TABLET | ORAL | Status: DC

## 2014-11-19 NOTE — Telephone Encounter (Signed)
Refill x1 

## 2014-11-19 NOTE — Telephone Encounter (Signed)
Faxed.   KP 

## 2014-11-19 NOTE — Telephone Encounter (Signed)
Last seen and filled 09/15/14 #30   Please advise     KP

## 2014-12-07 DIAGNOSIS — H2513 Age-related nuclear cataract, bilateral: Secondary | ICD-10-CM | POA: Diagnosis not present

## 2014-12-21 ENCOUNTER — Encounter: Payer: Self-pay | Admitting: Internal Medicine

## 2014-12-21 ENCOUNTER — Other Ambulatory Visit: Payer: Self-pay

## 2014-12-21 DIAGNOSIS — F419 Anxiety disorder, unspecified: Secondary | ICD-10-CM

## 2014-12-21 MED ORDER — LORAZEPAM 0.5 MG PO TABS: ORAL_TABLET | ORAL | Status: DC

## 2015-01-15 ENCOUNTER — Other Ambulatory Visit: Payer: Self-pay | Admitting: Family Medicine

## 2015-01-19 ENCOUNTER — Other Ambulatory Visit: Payer: Self-pay | Admitting: Family Medicine

## 2015-01-20 NOTE — Telephone Encounter (Signed)
Prescription called in to the pharmacy.     KP

## 2015-01-20 NOTE — Telephone Encounter (Signed)
Last seen 09/15/14 and filled 12/21/14 #30 UDS 02/19/14 low risk   Please advise     KP

## 2015-02-23 ENCOUNTER — Telehealth: Payer: Self-pay

## 2015-02-23 MED ORDER — LORAZEPAM 0.5 MG PO TABS: 0.5000 mg | ORAL_TABLET | Freq: Every day | ORAL | Status: DC

## 2015-02-23 NOTE — Telephone Encounter (Signed)
Refill x1 

## 2015-02-23 NOTE — Telephone Encounter (Signed)
Last seen 09/15/14 and filled 01/20/15 #30 UDS 02/19/14 low risk   Please advise     KP

## 2015-02-23 NOTE — Telephone Encounter (Signed)
Faxed.   KP 

## 2015-03-23 ENCOUNTER — Ambulatory Visit (INDEPENDENT_AMBULATORY_CARE_PROVIDER_SITE_OTHER): Payer: Medicare Other | Admitting: Family Medicine

## 2015-03-23 ENCOUNTER — Encounter: Payer: Self-pay | Admitting: Family Medicine

## 2015-03-23 VITALS — BP 122/80 | HR 68 | Temp 98.0°F | Wt 239.0 lb

## 2015-03-23 DIAGNOSIS — F329 Major depressive disorder, single episode, unspecified: Secondary | ICD-10-CM

## 2015-03-23 DIAGNOSIS — E785 Hyperlipidemia, unspecified: Secondary | ICD-10-CM | POA: Diagnosis not present

## 2015-03-23 DIAGNOSIS — Z23 Encounter for immunization: Secondary | ICD-10-CM | POA: Diagnosis not present

## 2015-03-23 DIAGNOSIS — J069 Acute upper respiratory infection, unspecified: Secondary | ICD-10-CM | POA: Diagnosis not present

## 2015-03-23 DIAGNOSIS — F32A Depression, unspecified: Secondary | ICD-10-CM

## 2015-03-23 LAB — COMPREHENSIVE METABOLIC PANEL
ALT: 27 U/L (ref 0–35)
AST: 21 U/L (ref 0–37)
Albumin: 4.5 g/dL (ref 3.5–5.2)
Alkaline Phosphatase: 79 U/L (ref 39–117)
BUN: 12 mg/dL (ref 6–23)
CHLORIDE: 101 meq/L (ref 96–112)
CO2: 31 mEq/L (ref 19–32)
CREATININE: 0.69 mg/dL (ref 0.40–1.20)
Calcium: 9.6 mg/dL (ref 8.4–10.5)
GFR: 89.92 mL/min (ref >60.00)
Glucose, Bld: 95 mg/dL (ref 70–99)
Potassium: 5 mEq/L (ref 3.5–5.1)
SODIUM: 138 meq/L (ref 135–145)
Total Bilirubin: 0.4 mg/dL (ref 0.2–1.2)
Total Protein: 7.6 g/dL (ref 6.0–8.3)

## 2015-03-23 LAB — LIPID PANEL
CHOL/HDL RATIO: 4
Cholesterol: 204 mg/dL — ABNORMAL HIGH (ref 0–200)
HDL: 52.8 mg/dL (ref >39.00)
LDL CALC: 131 mg/dL — AB (ref 0–99)
NONHDL: 151.29
TRIGLYCERIDES: 100 mg/dL (ref 0.0–149.0)
VLDL: 20 mg/dL (ref 0.0–40.0)

## 2015-03-23 MED ORDER — LORAZEPAM 0.5 MG PO TABS
0.5000 mg | ORAL_TABLET | Freq: Three times a day (TID) | ORAL | Status: DC | PRN
Start: 2015-03-23 — End: 2015-06-17

## 2015-03-23 MED ORDER — ATORVASTATIN CALCIUM 20 MG PO TABS: 20.0000 mg | ORAL_TABLET | Freq: Every day | ORAL | Status: DC

## 2015-03-23 MED ORDER — SERTRALINE HCL 100 MG PO TABS: 150.0000 mg | ORAL_TABLET | Freq: Every day | ORAL | Status: DC

## 2015-03-23 NOTE — Assessment & Plan Note (Signed)
con't antihistamine and steroid nasal spray rto or call prn

## 2015-03-23 NOTE — Progress Notes (Signed)
Pre visit review using our clinic review tool, if applicable. No additional management support is needed unless otherwise documented below in the visit note. 

## 2015-03-23 NOTE — Patient Instructions (Signed)

## 2015-03-23 NOTE — Progress Notes (Signed)
Patient ID: Chai Verdejo, female    DOB: June 13, 1945  Age: 69 y.o. MRN: [id number]    Subjective:  Subjective HPI Austin Pongratz presents for f/u htn, depression and pt is c/o cold symptoms x 2 weeks -- she has been takng antihistamines and thinks it is almost cleared up.    Review of Systems  Constitutional: Negative for diaphoresis, appetite change, fatigue and unexpected weight change.  HENT: Negative for congestion, ear discharge, ear pain, facial swelling and sore throat.   Eyes: Negative for pain, redness and visual disturbance.  Respiratory: Positive for cough. Negative for chest tightness, shortness of breath and wheezing.   Cardiovascular: Negative for chest pain, palpitations and leg swelling.  Endocrine: Negative for cold intolerance, heat intolerance, polydipsia, polyphagia and polyuria.  Genitourinary: Negative for dysuria, frequency and difficulty urinating.  Neurological: Negative for dizziness, light-headedness, numbness and headaches.  Psychiatric/Behavioral: Negative for dysphoric mood and decreased concentration. The patient is not nervous/anxious.     History Past Medical History  Diagnosis Date  . Anxiety   . Hyperlipidemia   . Osteopenia   . Colon polyps     hyperplastic and Adenomatous    She has past surgical history that includes Carpal tunnel release; Knee arthroscopy; and Colonoscopy.   Her family history includes Coronary artery disease in an other family member. There is no history of Colon cancer.She reports that she quit smoking about 28 years ago. She has never used smokeless tobacco. She reports that she drinks about 1.2 oz of alcohol per week. She reports that she does not use illicit drugs.  No current outpatient prescriptions on file prior to visit.   No current facility-administered medications on file prior to visit.     Objective:  Objective Physical Exam  Constitutional: She is oriented to person, place, and time. She appears  well-developed and well-nourished.  HENT:  Head: Normocephalic and atraumatic.  Eyes: Conjunctivae and EOM are normal.  Neck: Normal range of motion. Neck supple. No JVD present. Carotid bruit is not present. No thyromegaly present.  Cardiovascular: Normal rate, regular rhythm and normal heart sounds.   No murmur heard. Pulmonary/Chest: Effort normal and breath sounds normal. No respiratory distress. She has no wheezes. She has no rales. She exhibits no tenderness.  Musculoskeletal: She exhibits no edema.  Neurological: She is alert and oriented to person, place, and time.  Psychiatric: She has a normal mood and affect. Her behavior is normal. Judgment and thought content normal.  Nursing note and vitals reviewed.  BP 122/80 mmHg  Pulse 68  Temp(Src) 98 F (36.7 C) (Oral)  Wt 239 lb (108.41 kg)  SpO2 98% Wt Readings from Last 3 Encounters:  03/23/15 239 lb (108.41 kg)  09/15/14 244 lb 3.2 oz (110.768 kg)  02/19/14 241 lb 6.4 oz (109.498 kg)     Lab Results  Component Value Date   WBC 5.1 02/19/2014   HGB 13.2 02/19/2014   HCT 38.3 02/19/2014   PLT 223.0 02/19/2014   GLUCOSE 95 03/23/2015   CHOL 204* 03/23/2015   TRIG 100.0 03/23/2015   HDL 52.80 03/23/2015   LDLDIRECT 125.1 10/06/2011   LDLCALC 131* 03/23/2015   ALT 27 03/23/2015   AST 21 03/23/2015   NA 138 03/23/2015   K 5.0 03/23/2015   CL 101 03/23/2015   CREATININE 0.69 03/23/2015   BUN 12 03/23/2015   CO2 31 03/23/2015   TSH 2.68 08/11/2009    Dg Bone Density  10/19/2014  CLINICAL DATA:  69 year old  postmenopausal female. EXAM: DUAL X-RAY ABSORPTIOMETRY (DXA) FOR BONE MINERAL DENSITY FINDINGS: AP LUMBAR SPINE L1-L4 Bone Mineral Density (BMD):  0.991 g/cm2 Young Adult T-Score:  -0.5 Z-Score:  1.4 LEFT FEMUR NECK Bone Mineral Density (BMD):  0.795 g/cm2 Young Adult T-Score: -0.5 Z-Score:  1.2 ASSESSMENT: Patient's diagnostic category is NORMAL by WHO Criteria. FRACTURE RISK: NOT INCREASED COMPARISON: None.  Effective therapies are available in the form of bisphosphonates, selective estrogen receptor modulators, biologic agents, and hormone replacement therapy (for women). All patients should ensure an adequate intake of dietary calcium (1200 mg daily) and vitamin D (800 IU daily) unless contraindicated. All treatment decisions require clinical judgment and consideration of individual patient factors, including patient preferences, co-morbidities, previous drug use, risk factors not captured in the FRAX model (e.g., frailty, falls, vitamin D deficiency, increased bone turnover, interval significant decline in bone density) and possible under- or over-estimation of fracture risk by FRAX. The National Osteoporosis Foundation recommends that FDA-approved medical therapies be considered in postmenopausal women and men age 23 or older with a: 1. Hip or vertebral (clinical or morphometric) fracture. 2. T-score of -2.5 or lower at the spine or hip. 3. Ten-year fracture probability by FRAX of 3% or greater for hip fracture or 20% or greater for major osteoporotic fracture. People with diagnosed cases of osteoporosis or at high risk for fracture should have regular bone mineral density tests. For patients eligible for Medicare, routine testing is allowed once every 2 years. The testing frequency can be increased to one year for patients who have rapidly progressing disease, those who are receiving or discontinuing medical therapy to restore bone mass, or have additional risk factors. [facility]) Criteria: Normal: T-scores from +1.0 to -1.0 Low Bone Mass (Osteopenia): T-scores between -1.0 and -2.5 Osteoporosis: T-scores -2.5 and below Comparison to Reference Population: T-score is the key measure used in the diagnosis of osteoporosis and relative risk determination for fracture. It provides a value for bone mass relative to the mean bone mass of a young adult reference population expressed in terms of  standard deviation (SD). Z-score is the age-matched score showing the patient's values compared to a population matched for age, sex, and race. This is also expressed in terms of standard deviation. The patient may have values that compare favorably to the age-matched values and still be at increased risk for fracture. Electronically Signed   By: Lovey Newcomer M.D.   On: 10/19/2014 14:09   Mm Digital Screening  10/19/2014  CLINICAL DATA:  Screening. EXAM: DIGITAL SCREENING BILATERAL MAMMOGRAM WITH CAD COMPARISON:  Previous exam(s). ACR Breast Density Category a: The breast tissue is almost entirely fatty. FINDINGS: There are no findings suspicious for malignancy. Images were processed with CAD. IMPRESSION: No mammographic evidence of malignancy. A result letter of this screening mammogram will be mailed directly to the patient. RECOMMENDATION: Screening mammogram in one year. (Code:SM-B-01Y) BI-RADS CATEGORY  1: Negative. Electronically Signed   By: Lillia Mountain M.D.   On: 10/19/2014 13:07     Assessment & Plan:  Plan I have discontinued Ms. Rennie Tdap. I have also changed her LORazepam and sertraline. Additionally, I am having her maintain her atorvastatin.  Meds ordered this encounter  Medications  . atorvastatin (LIPITOR) 20 MG tablet    Sig: Take 1 tablet (20 mg total) by mouth daily.    Dispense:  90 tablet    Refill:  3    D/C PREVIOUS SCRIPTS FOR THIS MEDICATION  . LORazepam (ATIVAN) 0.5 MG  tablet    Sig: Take 1 tablet (0.5 mg total) by mouth every 8 (eight) hours as needed for anxiety.    Dispense:  90 tablet    Refill:  0  . sertraline (ZOLOFT) 100 MG tablet    Sig: Take 1.5 tablets (150 mg total) by mouth daily.    Dispense:  135 tablet    Refill:  1    Problem List Items Addressed This Visit    None    Visit Diagnoses    Hyperlipidemia    -  Primary    Relevant Medications    atorvastatin (LIPITOR) 20 MG tablet    Other Relevant Orders    Comp Met (CMET) (Completed)     Lipid panel (Completed)    Need for immunization against influenza        Relevant Orders    Flu Vaccine QUAD 36+ mos IM (Fluarix) (Completed)    Depression        Relevant Medications    LORazepam (ATIVAN) 0.5 MG tablet    sertraline (ZOLOFT) 100 MG tablet       Follow-up: Return in about 6 months (around 09/20/2015), or if symptoms worsen or fail to improve, for annual exam, fasting.  Garnet Koyanagi, DO

## 2015-06-08 ENCOUNTER — Telehealth: Payer: Self-pay | Admitting: Family Medicine

## 2015-06-08 DIAGNOSIS — E785 Hyperlipidemia, unspecified: Secondary | ICD-10-CM

## 2015-06-08 NOTE — Telephone Encounter (Signed)
Orders are in.     KP 

## 2015-06-08 NOTE — Telephone Encounter (Signed)
Pt coming in for repeat labs 06/24/15 (as req in lab notes). Please place orders as needed. Pt coming in for annual exam 09/28/15.

## 2015-06-17 ENCOUNTER — Other Ambulatory Visit: Payer: Self-pay | Admitting: Family Medicine

## 2015-06-17 NOTE — Telephone Encounter (Signed)
Last seen 03/23/15 and filled 03/23/15 #90  Please advise    KP

## 2015-06-24 ENCOUNTER — Other Ambulatory Visit (INDEPENDENT_AMBULATORY_CARE_PROVIDER_SITE_OTHER): Payer: Medicare Other

## 2015-06-24 DIAGNOSIS — E785 Hyperlipidemia, unspecified: Secondary | ICD-10-CM | POA: Diagnosis not present

## 2015-06-24 LAB — LIPID PANEL
CHOLESTEROL: 170 mg/dL (ref 0–200)
HDL: 66.6 mg/dL (ref >39.00)
LDL Cholesterol: 84 mg/dL (ref 0–99)
NonHDL: 103.28
TRIGLYCERIDES: 96 mg/dL (ref 0.0–149.0)
Total CHOL/HDL Ratio: 3
VLDL: 19.2 mg/dL (ref 0.0–40.0)

## 2015-06-24 LAB — COMPREHENSIVE METABOLIC PANEL
ALBUMIN: 4.2 g/dL (ref 3.5–5.2)
ALT: 15 U/L (ref 0–35)
AST: 14 U/L (ref 0–37)
Alkaline Phosphatase: 70 U/L (ref 39–117)
BUN: 17 mg/dL (ref 6–23)
CHLORIDE: 104 meq/L (ref 96–112)
CO2: 30 mEq/L (ref 19–32)
Calcium: 9.3 mg/dL (ref 8.4–10.5)
Creatinine, Ser: 0.73 mg/dL (ref 0.40–1.20)
GFR: 84.19 mL/min (ref >60.00)
Glucose, Bld: 106 mg/dL — ABNORMAL HIGH (ref 70–99)
POTASSIUM: 4.2 meq/L (ref 3.5–5.1)
Sodium: 139 mEq/L (ref 135–145)
Total Bilirubin: 0.4 mg/dL (ref 0.2–1.2)
Total Protein: 7 g/dL (ref 6.0–8.3)

## 2015-09-17 ENCOUNTER — Other Ambulatory Visit: Payer: Self-pay | Admitting: Family Medicine

## 2015-09-17 NOTE — Telephone Encounter (Signed)
Last seen 11.8.16 and filled 2.2.17 #90   Please advise    KP

## 2015-09-25 ENCOUNTER — Other Ambulatory Visit: Payer: Self-pay | Admitting: Family Medicine

## 2015-09-28 ENCOUNTER — Encounter: Payer: Self-pay | Admitting: Family Medicine

## 2015-09-28 ENCOUNTER — Ambulatory Visit (INDEPENDENT_AMBULATORY_CARE_PROVIDER_SITE_OTHER): Payer: Medicare Other | Admitting: Family Medicine

## 2015-09-28 VITALS — BP 116/78 | HR 78 | Temp 98.0°F | Ht 69.0 in | Wt 242.8 lb

## 2015-09-28 DIAGNOSIS — Z23 Encounter for immunization: Secondary | ICD-10-CM | POA: Diagnosis not present

## 2015-09-28 DIAGNOSIS — F411 Generalized anxiety disorder: Secondary | ICD-10-CM

## 2015-09-28 DIAGNOSIS — Z79899 Other long term (current) drug therapy: Secondary | ICD-10-CM | POA: Diagnosis not present

## 2015-09-28 DIAGNOSIS — E785 Hyperlipidemia, unspecified: Secondary | ICD-10-CM | POA: Diagnosis not present

## 2015-09-28 NOTE — Progress Notes (Signed)
Subjective:    Patient ID: Allison Cox, female    DOB: 1946/02/23, 70 y.o.   MRN: [id number]  Chief Complaint  Patient presents with  . Hyperlipidemia  . Follow-up    HPI Patient is in today for f/u cholesterol and anxiety.  She does not want her physical today  Past Medical History  Diagnosis Date  . Anxiety   . Hyperlipidemia   . Osteopenia   . Colon polyps     hyperplastic and Adenomatous    Past Surgical History  Procedure Laterality Date  . Carpal tunnel release    . Knee arthroscopy    . Colonoscopy      Family History  Problem Relation Age of Onset  . Coronary artery disease    . Colon cancer Neg Hx     Social History   Social History  . Marital Status: Single    Spouse Name: N/A  . Number of Children: N/A  . Years of Education: N/A   Occupational History  . Not on file.   Social History Main Topics  . Smoking status: Former Smoker    Quit date: 05/15/1986  . Smokeless tobacco: Never Used  . Alcohol Use: 1.2 oz/week    2 Glasses of wine per week  . Drug Use: No  . Sexual Activity:    Partners: Male   Other Topics Concern  . Not on file   Social History Narrative    Outpatient Prescriptions Prior to Visit  Medication Sig Dispense Refill  . atorvastatin (LIPITOR) 20 MG tablet Take 1 tablet (20 mg total) by mouth daily. 90 tablet 3  . LORazepam (ATIVAN) 0.5 MG tablet TAKE 1 TABLET BY MOUTH EVERY 8 HOURS AS NEEDED FOR ANXIETY 90 tablet 0  . sertraline (ZOLOFT) 100 MG tablet TAKE 1 AND 1/2 TABLETS BY MOUTH EVERY DAY 135 tablet 0   No facility-administered medications prior to visit.    Allergies  Allergen Reactions  . Codeine Nausea And Vomiting    Review of Systems  Constitutional: Negative for fever, chills and malaise/fatigue.  HENT: Negative for congestion and hearing loss.   Eyes: Negative for discharge.  Respiratory: Negative for cough, sputum production and shortness of breath.   Cardiovascular: Negative for chest pain,  palpitations and leg swelling.  Gastrointestinal: Negative for heartburn, nausea, vomiting, abdominal pain, diarrhea, constipation and blood in stool.  Genitourinary: Negative for dysuria, urgency, frequency and hematuria.  Musculoskeletal: Negative for myalgias, back pain and falls.  Skin: Negative for rash.  Neurological: Negative for dizziness, sensory change, loss of consciousness, weakness and headaches.  Endo/Heme/Allergies: Negative for environmental allergies. Does not bruise/bleed easily.  Psychiatric/Behavioral: Negative for depression and suicidal ideas. The patient is not nervous/anxious and does not have insomnia.        Objective:    Physical Exam  Constitutional: She is oriented to person, place, and time. She appears well-developed and well-nourished.  HENT:  Head: Normocephalic and atraumatic.  Eyes: Conjunctivae and EOM are normal.  Neck: Normal range of motion. Neck supple. No JVD present. Carotid bruit is not present. No thyromegaly present.  Cardiovascular: Normal rate, regular rhythm and normal heart sounds.   No murmur heard. Pulmonary/Chest: Effort normal and breath sounds normal. No respiratory distress. She has no wheezes. She has no rales. She exhibits no tenderness.  Musculoskeletal: She exhibits no edema.  Neurological: She is alert and oriented to person, place, and time.  Psychiatric: She has a normal mood and affect.  BP 116/78 mmHg  Pulse 78  Temp(Src) 98 F (36.7 C) (Oral)  Ht 5\' 9"  (1.753 m)  Wt 242 lb 12.8 oz (110.133 kg)  BMI 35.84 kg/m2  SpO2 96% Wt Readings from Last 3 Encounters:  09/28/15 242 lb 12.8 oz (110.133 kg)  03/23/15 239 lb (108.41 kg)  09/15/14 244 lb 3.2 oz (110.768 kg)     Lab Results  Component Value Date   WBC 5.1 02/19/2014   HGB 13.2 02/19/2014   HCT 38.3 02/19/2014   PLT 223.0 02/19/2014   GLUCOSE 106* 06/24/2015   CHOL 170 06/24/2015   TRIG 96.0 06/24/2015   HDL 66.60 06/24/2015   LDLDIRECT 125.1  10/06/2011   LDLCALC 84 06/24/2015   ALT 15 06/24/2015   AST 14 06/24/2015   NA 139 06/24/2015   K 4.2 06/24/2015   CL 104 06/24/2015   CREATININE 0.73 06/24/2015   BUN 17 06/24/2015   CO2 30 06/24/2015   TSH 2.68 08/11/2009    Lab Results  Component Value Date   TSH 2.68 08/11/2009   Lab Results  Component Value Date   WBC 5.1 02/19/2014   HGB 13.2 02/19/2014   HCT 38.3 02/19/2014   MCV 87.3 02/19/2014   PLT 223.0 02/19/2014   Lab Results  Component Value Date   NA 139 06/24/2015   K 4.2 06/24/2015   CO2 30 06/24/2015   GLUCOSE 106* 06/24/2015   BUN 17 06/24/2015   CREATININE 0.73 06/24/2015   BILITOT 0.4 06/24/2015   ALKPHOS 70 06/24/2015   AST 14 06/24/2015   ALT 15 06/24/2015   PROT 7.0 06/24/2015   ALBUMIN 4.2 06/24/2015   CALCIUM 9.3 06/24/2015   GFR 84.19 06/24/2015   Lab Results  Component Value Date   CHOL 170 06/24/2015   Lab Results  Component Value Date   HDL 66.60 06/24/2015   Lab Results  Component Value Date   LDLCALC 84 06/24/2015   Lab Results  Component Value Date   TRIG 96.0 06/24/2015   Lab Results  Component Value Date   CHOLHDL 3 06/24/2015   No results found for: HGBA1C     Assessment & Plan:   Problem List Items Addressed This Visit    None    Visit Diagnoses    Need for pneumococcal vaccination    -  Primary    Relevant Orders    Pneumococcal conjugate vaccine 13-valent (Completed)    Need for TD vaccine        Hyperlipidemia        Relevant Medications    atorvastatin (LIPITOR) 20 MG tablet    Generalized anxiety disorder        Relevant Medications    sertraline (ZOLOFT) 100 MG tablet    LORazepam (ATIVAN) 0.5 MG tablet       I have changed Ms. Moreira sertraline and LORazepam. I am also having her maintain her atorvastatin.  Meds ordered this encounter  Medications  . atorvastatin (LIPITOR) 20 MG tablet    Sig: Take 1 tablet (20 mg total) by mouth daily.    Dispense:  90 tablet    Refill:  3     D/C PREVIOUS SCRIPTS FOR THIS MEDICATION  . sertraline (ZOLOFT) 100 MG tablet    Sig: Take 1.5 tablets (150 mg total) by mouth daily.    Dispense:  135 tablet    Refill:  0  . LORazepam (ATIVAN) 0.5 MG tablet    Sig: Take 1 tablet (0.5 mg total)  by mouth every 8 (eight) hours as needed. for anxiety    Dispense:  90 tablet    Refill:  0    Not to exceed 5 additional fills before 12/14/2015     [name], DO

## 2015-09-28 NOTE — Patient Instructions (Signed)

## 2015-09-29 MED ORDER — SERTRALINE HCL 100 MG PO TABS: 150.0000 mg | ORAL_TABLET | Freq: Every day | ORAL | Status: DC

## 2015-09-29 MED ORDER — LORAZEPAM 0.5 MG PO TABS: 0.5000 mg | ORAL_TABLET | Freq: Three times a day (TID) | ORAL | Status: DC | PRN

## 2015-09-29 MED ORDER — ATORVASTATIN CALCIUM 20 MG PO TABS: 20.0000 mg | ORAL_TABLET | Freq: Every day | ORAL | Status: DC

## 2015-12-02 ENCOUNTER — Encounter: Payer: Self-pay | Admitting: Family Medicine

## 2015-12-13 ENCOUNTER — Other Ambulatory Visit: Payer: Self-pay | Admitting: Family Medicine

## 2015-12-13 DIAGNOSIS — F411 Generalized anxiety disorder: Secondary | ICD-10-CM

## 2015-12-14 NOTE — Telephone Encounter (Signed)
Last seen 09/28/15 and filled 09/29/15 #90    Please advise     KP

## 2016-02-04 DIAGNOSIS — H66001 Acute suppurative otitis media without spontaneous rupture of ear drum, right ear: Secondary | ICD-10-CM | POA: Diagnosis not present

## 2016-02-24 ENCOUNTER — Encounter: Payer: Self-pay | Admitting: Family Medicine

## 2016-02-29 ENCOUNTER — Ambulatory Visit (INDEPENDENT_AMBULATORY_CARE_PROVIDER_SITE_OTHER): Payer: Medicare Other | Admitting: Behavioral Health

## 2016-02-29 ENCOUNTER — Other Ambulatory Visit: Payer: Medicare Other

## 2016-02-29 DIAGNOSIS — Z23 Encounter for immunization: Secondary | ICD-10-CM | POA: Diagnosis not present

## 2016-02-29 NOTE — Progress Notes (Signed)
Pre visit review using our clinic review tool, if applicable. No additional management support is needed unless otherwise documented below in the visit note.  Patient in clinic today for Influenza vaccination. IM given in Left Deltoid. Patient tolerated injection well.

## 2016-03-15 ENCOUNTER — Other Ambulatory Visit: Payer: Self-pay | Admitting: Family Medicine

## 2016-03-15 DIAGNOSIS — F411 Generalized anxiety disorder: Secondary | ICD-10-CM

## 2016-03-16 NOTE — Telephone Encounter (Signed)
Pt is requesting refill on Lorazepam.  Last OV: 09/28/2015 Last Fill: 12/14/2015 #90 and 0RF UDS: 09/28/2015 Low risk  Please advise.

## 2016-03-16 NOTE — Telephone Encounter (Signed)
Refill x1 

## 2016-03-16 NOTE — Telephone Encounter (Signed)
Rx printed, awaiting DO signature.  

## 2016-03-16 NOTE — Telephone Encounter (Signed)
Rx faxed to [facility] pharmacy.  

## 2016-03-31 ENCOUNTER — Telehealth: Payer: Self-pay | Admitting: Family Medicine

## 2016-03-31 NOTE — Telephone Encounter (Signed)
Called patient to schedule AWV with nurse 04/10/16 after her visit with provider left message to call back

## 2016-04-10 ENCOUNTER — Encounter: Payer: Self-pay | Admitting: Family Medicine

## 2016-04-10 ENCOUNTER — Ambulatory Visit (INDEPENDENT_AMBULATORY_CARE_PROVIDER_SITE_OTHER): Payer: Medicare Other | Admitting: Family Medicine

## 2016-04-10 VITALS — BP 112/78 | HR 76 | Temp 98.3°F | Resp 16 | Ht 69.0 in | Wt 241.6 lb

## 2016-04-10 DIAGNOSIS — I10 Essential (primary) hypertension: Secondary | ICD-10-CM | POA: Diagnosis not present

## 2016-04-10 DIAGNOSIS — E785 Hyperlipidemia, unspecified: Secondary | ICD-10-CM | POA: Diagnosis not present

## 2016-04-10 DIAGNOSIS — Z1159 Encounter for screening for other viral diseases: Secondary | ICD-10-CM

## 2016-04-10 DIAGNOSIS — F411 Generalized anxiety disorder: Secondary | ICD-10-CM

## 2016-04-10 DIAGNOSIS — Z0000 Encounter for general adult medical examination without abnormal findings: Secondary | ICD-10-CM

## 2016-04-10 LAB — CBC WITH DIFFERENTIAL/PLATELET
BASOS ABS: 0 10*3/uL (ref 0.0–0.1)
BASOS PCT: 0.5 % (ref 0.0–3.0)
EOS PCT: 1.4 % (ref 0.0–5.0)
Eosinophils Absolute: 0.1 10*3/uL (ref 0.0–0.7)
HEMATOCRIT: 40.1 % (ref 36.0–46.0)
Hemoglobin: 13.7 g/dL (ref 12.0–15.0)
LYMPHS ABS: 0.9 10*3/uL (ref 0.7–4.0)
LYMPHS PCT: 15.3 % (ref 12.0–46.0)
MCHC: 34.1 g/dL (ref 30.0–36.0)
MCV: 87.2 fl (ref 78.0–100.0)
MONOS PCT: 6 % (ref 3.0–12.0)
Monocytes Absolute: 0.3 10*3/uL (ref 0.1–1.0)
Neutro Abs: 4.5 10*3/uL (ref 1.4–7.7)
Neutrophils Relative %: 76.8 % (ref 43.0–77.0)
Platelets: 244 10*3/uL (ref 150.0–400.0)
RBC: 4.6 Mil/uL (ref 3.87–5.11)
RDW: 14.5 % (ref 11.5–15.5)
WBC: 5.9 10*3/uL (ref 4.0–10.5)

## 2016-04-10 LAB — LIPID PANEL
CHOLESTEROL: 195 mg/dL (ref 0–200)
HDL: 69.7 mg/dL (ref >39.00)
LDL Cholesterol: 100 mg/dL — ABNORMAL HIGH (ref 0–99)
NonHDL: 125.47
TRIGLYCERIDES: 129 mg/dL (ref 0.0–149.0)
Total CHOL/HDL Ratio: 3
VLDL: 25.8 mg/dL (ref 0.0–40.0)

## 2016-04-10 LAB — COMPREHENSIVE METABOLIC PANEL
ALBUMIN: 4.6 g/dL (ref 3.5–5.2)
ALK PHOS: 73 U/L (ref 39–117)
ALT: 20 U/L (ref 0–35)
AST: 18 U/L (ref 0–37)
BUN: 13 mg/dL (ref 6–23)
CALCIUM: 9.7 mg/dL (ref 8.4–10.5)
CO2: 28 meq/L (ref 19–32)
CREATININE: 0.77 mg/dL (ref 0.40–1.20)
Chloride: 101 mEq/L (ref 96–112)
GFR: 78.98 mL/min (ref >60.00)
Glucose, Bld: 98 mg/dL (ref 70–99)
Potassium: 4.6 mEq/L (ref 3.5–5.1)
Sodium: 137 mEq/L (ref 135–145)
Total Bilirubin: 0.4 mg/dL (ref 0.2–1.2)
Total Protein: 7.8 g/dL (ref 6.0–8.3)

## 2016-04-10 MED ORDER — ATORVASTATIN CALCIUM 20 MG PO TABS: 20.0000 mg | ORAL_TABLET | Freq: Every day | ORAL | 3 refills | Status: DC

## 2016-04-10 MED ORDER — LORAZEPAM 0.5 MG PO TABS: 0.5000 mg | ORAL_TABLET | Freq: Three times a day (TID) | ORAL | 0 refills | Status: DC | PRN

## 2016-04-10 MED ORDER — SERTRALINE HCL 100 MG PO TABS: 150.0000 mg | ORAL_TABLET | Freq: Every day | ORAL | 1 refills | Status: DC

## 2016-04-10 NOTE — Progress Notes (Signed)
Subjective:   Allison Cox is a 70 y.o. female who presents for an Initial Medicare Annual Wellness Visit.  Review of Systems    No ROS.  Medicare Wellness Visit.  Cardiac Risk Factors include: advanced age (>42men, >65 women);obesity (BMI >30kg/m2);sedentary lifestyle;dyslipidemia  Sleep patterns: No sleep issues. 7-8 hrs nightly. Gets up twice nightly to void.   Home Safety/Smoke Alarms: Lives in single family home w/ dog and partner who is there '75% of the time'. Feels safe in home.   Living environment; residence and Firearm Safety: No firearms. Seat Belt Safety/Bike Helmet: Wears seat belt.   Counseling:   Eye Exam- Follows w/ [facility].  Dental- Does not currently have a dentist but is planning to call to schedule appt soon. Declined dental resources list.  Female:   Pap- N/A       Mammo- last 10/19/14 BI-RADS CATEGORY  1: Negative.       Dexa scan- last 10/19/14 Normal        CCS- last 07/15/12 w/ Dr. Scarlette Shorts. 2 polyps removed, 5 year follow-up.     Objective:    Today's Vitals   04/10/16 1013  BP: 112/78  Pulse: 76  Resp: 16  Temp: 98.3 F (36.8 C)  TempSrc: Oral  SpO2: 97%  Weight: 241 lb 9.6 oz (109.6 kg)  Height: 5\' 9"  (1.753 m)   Body mass index is 35.68 kg/m.   Current Medications (verified) Outpatient Encounter Prescriptions as of 04/10/2016  Medication Sig  . atorvastatin (LIPITOR) 20 MG tablet Take 1 tablet (20 mg total) by mouth daily.  Marland Kitchen LORazepam (ATIVAN) 0.5 MG tablet TAKE 1 TABLET BY MOUTH EVERY 8 HOURS AS NEEDED FOR ANXIETY  . sertraline (ZOLOFT) 100 MG tablet Take 1.5 tablets (150 mg total) by mouth daily.  . sertraline (ZOLOFT) 100 MG tablet TAKE 1 AND 1/2 TABLETS BY MOUTH EVERY DAY   No facility-administered encounter medications on file as of 04/10/2016.     Allergies (verified) Codeine   History: Past Medical History:  Diagnosis Date  . Anxiety   . Colon polyps    hyperplastic and Adenomatous  . Hyperlipidemia   .  Osteopenia    Past Surgical History:  Procedure Laterality Date  . CARPAL TUNNEL RELEASE    . COLONOSCOPY    . KNEE ARTHROSCOPY     Family History  Problem Relation Age of Onset  . Coronary artery disease    . Stroke Mother   . Heart attack Father   . Colon cancer Neg Hx    Social History   Occupational History  . Not on file.   Social History Main Topics  . Smoking status: Former Smoker    Quit date: 05/15/1986  . Smokeless tobacco: Never Used  . Alcohol use 1.2 oz/week    2 Glasses of wine per week  . Drug use: No  . Sexual activity: Yes    Partners: Male    Tobacco Counseling Counseling given: Not Answered   Activities of Daily Living In your present state of health, do you have any difficulty performing the following activities: 04/10/2016 09/28/2015  Hearing? N Y  Vision? N N  Difficulty concentrating or making decisions? N N  Walking or climbing stairs? N N  Dressing or bathing? N N  Doing errands, shopping? N N  Preparing Food and eating ? N -  Using the Toilet? N -  In the past six months, have you accidently leaked urine? Y -  Do you  have problems with loss of bowel control? N -  Managing your Medications? N -  Managing your Finances? N -  Housekeeping or managing your Housekeeping? N -  Some recent data might be hidden    Immunizations and Health Maintenance Immunization History  Administered Date(s) Administered  . Influenza Split 04/18/2012  . Influenza Whole 02/13/2008, 02/16/2009, 02/08/2010  . Influenza, High Dose Seasonal PF 02/29/2016  . Influenza,inj,Quad PF,36+ Mos 02/12/2013, 03/23/2015  . Pneumococcal Conjugate-13 09/28/2015  . Pneumococcal Polysaccharide-23 10/02/2013  . Td 06/29/2003  . Zoster 01/03/2011   Health Maintenance Due  Topic Date Due  . Hepatitis C Screening  [birth year]  . TETANUS/TDAP  06/28/2013    Patient Care Team: [name], DO as PCP - General  Indicate any recent Medical Services you may have  received from other than [facility] providers in the past year (date may be approximate).     Assessment:   This is a routine wellness examination for Allison Cox. Physical assessment deferred to PCP.  Hearing/Vision screen  Hearing Screening   125Hz  250Hz  500Hz  1000Hz  2000Hz  3000Hz  4000Hz  6000Hz  8000Hz   Right ear:   Pass Pass Pass  Pass    Left ear:   Fail Fail Pass  Pass    Comments: Able to hear conversational tones w/o difficulty. No issues reported.    Visual Acuity Screening   Right eye Left eye Both eyes  Without correction:     With correction: 20/25 20/20 20/20   Comments: Follows w/ [facility] every 2-3 years or as needed. Wearing glasses today. No issues reported.   Dietary issues and exercise activities discussed: Current Exercise Habits: The patient does not participate in regular exercise at present  Diet (meal preparation, eat out, water intake, caffeinated beverages, dairy products, fruits and vegetables): well balanced, on average, 2 meals per day. Able to prepare meals. Drinks water 'all day long.' Does not drink coffee, 2 diet sodas in the morning.  24 Hour Recall Breakfast: Pumpkin pie Lunch: Thanksgiving leftovers ([place], dressing, mashed potatoes, green beans) Dinner: Same as lunch       Goals    . Increase physical activity      Depression Screen PHQ 2/9 Scores 04/10/2016 09/28/2015 09/15/2014 04/04/2013  PHQ - 2 Score 0 0 0 0    Fall Risk Fall Risk  04/10/2016 09/28/2015 09/15/2014 04/04/2013  Falls in the past year? No No No No    Cognitive Function: MMSE - Mini Mental State Exam 04/10/2016  Orientation to time 5  Orientation to Place 5  Registration 3  Attention/ Calculation 5  Recall 3  Language- name 2 objects 2  Language- repeat 1  Language- follow 3 step command 3  Language- read & follow direction 1  Write a sentence 1  Copy design 1  Total score 30        Screening Tests Health Maintenance  Topic Date Due  . Hepatitis C Screening   Dec 27, 1945  . TETANUS/TDAP  06/28/2013  . MAMMOGRAM  10/18/2016  . COLONOSCOPY  07/15/2017  . INFLUENZA VACCINE  Completed  . DEXA SCAN  Completed  . ZOSTAVAX  Completed  . PNA vac Low Risk Adult  Completed      Plan:    Follow-up w/ Dr. Carollee Herter as directed.  Kegel exercises as tolerated.  Create your advance directives and bring a copy of your advance directives to your next office visit.  During the course of the visit, Allison Cox was educated and counseled about the following appropriate screening  and preventive services:   Vaccines to include Pneumoccal, Influenza, Hepatitis B, Td, Zostavax, HCV  Cardiovascular disease screening  Colorectal cancer screening  Bone density screening  Diabetes screening  Glaucoma screening  Mammography/PAP  Nutrition counseling  Patient Instructions (the written plan) were given to the patient.    Dorrene German, RN   04/10/2016

## 2016-04-10 NOTE — Progress Notes (Signed)
Pre visit review using our clinic review tool, if applicable. No additional management support is needed unless otherwise documented below in the visit note. 

## 2016-04-10 NOTE — Patient Instructions (Addendum)
Create your advanced directives and bring them to your next office visit.    Cholesterol Cholesterol is a white, waxy, fat-like substance that is needed by the human body in small amounts. The liver makes all the cholesterol we need. Cholesterol is carried from the liver by the blood through the blood vessels. Deposits of cholesterol (plaques) may build up on blood vessel (artery) walls. Plaques make the arteries narrower and stiffer. Cholesterol plaques increase the risk for heart attack and stroke. You cannot feel your cholesterol level even if it is very high. The only way to know that it is high is to have a blood test. Once you know your cholesterol levels, you should keep a record of the test results. Work with your health care provider to keep your levels in the desired range. What do the results mean?  Total cholesterol is a rough measure of all the cholesterol in your blood.  LDL (low-density lipoprotein) is the "bad" cholesterol. This is the type that causes plaque to build up on the artery walls. You want this level to be low.  HDL (high-density lipoprotein) is the "good" cholesterol because it cleans the arteries and carries the LDL away. You want this level to be high.  Triglycerides are fat that the body can either burn for energy or store. High levels are closely linked to heart disease. What are the desired levels of cholesterol?  Total cholesterol below 200.  LDL below 100 for people who are at risk, below 70 for people at very high risk.  HDL above 40 is good. A level of 60 or higher is considered to be protective against heart disease.  Triglycerides below 150. How can I lower my cholesterol? Diet  Follow your diet program as told by your health care provider.  Choose fish or white meat chicken and [place], roasted or baked. Limit fatty cuts of red meat, fried foods, and processed meats, such as sausage and lunch meats.  Eat lots of fresh fruits and  vegetables.  Choose whole grains, beans, pasta, potatoes, and cereals.  Choose olive oil, corn oil, or canola oil, and use only small amounts.  Avoid butter, mayonnaise, shortening, or palm kernel oils.  Avoid foods with trans fats.  Drink skim or nonfat milk and eat low-fat or nonfat yogurt and cheeses. Avoid whole milk, cream, ice cream, egg yolks, and full-fat cheeses.  Healthier desserts include angel food cake, ginger snaps, animal crackers, hard candy, popsicles, and low-fat or nonfat frozen yogurt. Avoid pastries, cakes, pies, and cookies. Exercise  Follow your exercise program as told by your health care provider. A regular program:  Helps to decrease LDL and raise HDL.  Helps with weight control.  Do things that increase your activity level, such as gardening, walking, and taking the stairs.  Ask your health care provider about ways that you can be more active in your daily life. Medicine  Take over-the-counter and prescription medicines only as told by your health care provider.  Medicine may be prescribed by your health care provider to help lower cholesterol and decrease the risk for heart disease. This is usually done if diet and exercise have failed to bring down cholesterol levels.  If you have several risk factors, you may need medicine even if your levels are normal. This information is not intended to replace advice given to you by your health care provider. Make sure you discuss any questions you have with your health care provider. Document Released: 01/24/2001 Document Revised: 11/27/2015  Document Reviewed: 10/30/2015 Elsevier Interactive Patient Education  2017 [place] Exercises The goal of Kegel exercises is to isolate and exercise your pelvic floor muscles. These muscles act as a hammock that supports the rectum, vagina, small intestine, and uterus. As the muscles weaken, the hammock sags and these organs are displaced from their normal  positions. Kegel exercises can strengthen your pelvic floor muscles and help you to improve bladder and bowel control, improve sexual response, and help reduce many problems and some discomfort during pregnancy. Kegel exercises can be done anywhere and at any time. HOW TO PERFORM KEGEL EXERCISES 1. Locate your pelvic floor muscles. To do this, squeeze (contract) the muscles that you use when you try to stop the flow of urine. You will feel a tightness in the vaginal area (women) and a tight lift in the rectal area (men and women). 2. When you begin, contract your pelvic muscles tight for 2-5 seconds, then relax them for 2-5 seconds. This is one set. Do 4-5 sets with a short pause in between. 3. Contract your pelvic muscles for 8-10 seconds, then relax them for 8-10 seconds. Do 4-5 sets. If you cannot contract your pelvic muscles for 8-10 seconds, try 5-7 seconds and work your way up to 8-10 seconds. Your goal is 4-5 sets of 10 contractions each day. Keep your stomach, buttocks, and legs relaxed during the exercises. Perform sets of both short and long contractions. Vary your positions. Perform these contractions 3-4 times per day. Perform sets while you are:   Lying in bed in the morning.  Standing at lunch.  Sitting in the late afternoon.  Lying in bed at night. You should do 40-50 contractions per day. Do not perform more Kegel exercises per day than recommended. Overexercising can cause muscle fatigue. Continue these exercises for for at least 15-20 weeks or as directed by your caregiver. This information is not intended to replace advice given to you by your health care provider. Make sure you discuss any questions you have with your health care provider. Document Released: 04/17/2012 Document Revised: 05/22/2014 Document Reviewed: 03/21/2015 Elsevier Interactive Patient Education  2017 [employer].

## 2016-04-10 NOTE — Progress Notes (Signed)
Patient ID: Allison Cox, female    DOB: [birth year]  Age: 70 y.o. MRN: [id number]    Subjective:  Subjective  HPI Allison Cox presents for f/u hyperlipidemia, and anxiety.    Review of Systems  Constitutional: Negative for activity change, appetite change, diaphoresis, fatigue and unexpected weight change.  Eyes: Negative for pain, redness and visual disturbance.  Respiratory: Negative for cough, chest tightness, shortness of breath and wheezing.   Cardiovascular: Negative for chest pain, palpitations and leg swelling.  Endocrine: Negative for cold intolerance, heat intolerance, polydipsia, polyphagia and polyuria.  Genitourinary: Negative for difficulty urinating, dysuria and frequency.  Neurological: Negative for dizziness, light-headedness, numbness and headaches.  Psychiatric/Behavioral: Negative for behavioral problems and dysphoric mood. The patient is not nervous/anxious.     History Past Medical History:  Diagnosis Date  . Anxiety   . Colon polyps    hyperplastic and Adenomatous  . Hyperlipidemia   . Osteopenia     She has a past surgical history that includes Carpal tunnel release; Knee arthroscopy; and Colonoscopy.   Her family history includes Heart attack in her father; Stroke in her mother.She reports that she quit smoking about 29 years ago. She has never used smokeless tobacco. She reports that she drinks about 1.2 oz of alcohol per week . She reports that she does not use drugs.  No current outpatient prescriptions on file prior to visit.   No current facility-administered medications on file prior to visit.      Objective:  Objective  Physical Exam  Constitutional: She is oriented to person, place, and time. She appears well-developed and well-nourished.  HENT:  Head: Normocephalic and atraumatic.  Eyes: Conjunctivae and EOM are normal.  Neck: Normal range of motion. Neck supple. No JVD present. Carotid bruit is not present. No thyromegaly  present.  Cardiovascular: Normal rate, regular rhythm and normal heart sounds.   No murmur heard. Pulmonary/Chest: Effort normal and breath sounds normal. No respiratory distress. She has no wheezes. She has no rales. She exhibits no tenderness.  Musculoskeletal: She exhibits no edema.  Neurological: She is alert and oriented to person, place, and time.  Psychiatric: She has a normal mood and affect. Her behavior is normal. Judgment and thought content normal.  Nursing note and vitals reviewed.  BP 112/78 (BP Location: Left Arm, Patient Position: Sitting, Cuff Size: Large)   Pulse 76   Temp 98.3 F (36.8 C) (Oral)   Resp 16   Ht 5\' 9"  (1.753 m)   Wt 241 lb 9.6 oz (109.6 kg)   SpO2 97%   BMI 35.68 kg/m  Wt Readings from Last 3 Encounters:  04/10/16 241 lb 9.6 oz (109.6 kg)  09/28/15 242 lb 12.8 oz (110.1 kg)  03/23/15 239 lb (108.4 kg)     Lab Results  Component Value Date   WBC 5.9 04/10/2016   HGB 13.7 04/10/2016   HCT 40.1 04/10/2016   PLT 244.0 04/10/2016   GLUCOSE 98 04/10/2016   CHOL 195 04/10/2016   TRIG 129.0 04/10/2016   HDL 69.70 04/10/2016   LDLDIRECT 125.1 10/06/2011   LDLCALC 100 (H) 04/10/2016   ALT 20 04/10/2016   AST 18 04/10/2016   NA 137 04/10/2016   K 4.6 04/10/2016   CL 101 04/10/2016   CREATININE 0.77 04/10/2016   BUN 13 04/10/2016   CO2 28 04/10/2016   TSH 2.68 08/11/2009    Dg Bone Density  Result Date: 10/19/2014 CLINICAL DATA:  70 year old postmenopausal female. EXAM: DUAL X-RAY  ABSORPTIOMETRY (DXA) FOR BONE MINERAL DENSITY FINDINGS: AP LUMBAR SPINE L1-L4 Bone Mineral Density (BMD):  0.991 g/cm2 Young Adult T-Score:  -0.5 Z-Score:  1.4 LEFT FEMUR NECK Bone Mineral Density (BMD):  0.795 g/cm2 Young Adult T-Score: -0.5 Z-Score:  1.2 ASSESSMENT: Patient's diagnostic category is NORMAL by WHO Criteria. FRACTURE RISK: NOT INCREASED COMPARISON: None. Effective therapies are available in the form of bisphosphonates, selective estrogen receptor  modulators, biologic agents, and hormone replacement therapy (for women). All patients should ensure an adequate intake of dietary calcium (1200 mg daily) and vitamin D (800 IU daily) unless contraindicated. All treatment decisions require clinical judgment and consideration of individual patient factors, including patient preferences, co-morbidities, previous drug use, risk factors not captured in the FRAX model (e.g., frailty, falls, vitamin D deficiency, increased bone turnover, interval significant decline in bone density) and possible under- or over-estimation of fracture risk by FRAX. The National Osteoporosis Foundation recommends that FDA-approved medical therapies be considered in postmenopausal women and men age 6 or older with a: 1. Hip or vertebral (clinical or morphometric) fracture. 2. T-score of -2.5 or lower at the spine or hip. 3. Ten-year fracture probability by FRAX of 3% or greater for hip fracture or 20% or greater for major osteoporotic fracture. People with diagnosed cases of osteoporosis or at high risk for fracture should have regular bone mineral density tests. For patients eligible for Medicare, routine testing is allowed once every 2 years. The testing frequency can be increased to one year for patients who have rapidly progressing disease, those who are receiving or discontinuing medical therapy to restore bone mass, or have additional risk factors. [facility]) Criteria: Normal: T-scores from +1.0 to -1.0 Low Bone Mass (Osteopenia): T-scores between -1.0 and -2.5 Osteoporosis: T-scores -2.5 and below Comparison to Reference Population: T-score is the key measure used in the diagnosis of osteoporosis and relative risk determination for fracture. It provides a value for bone mass relative to the mean bone mass of a young adult reference population expressed in terms of standard deviation (SD). Z-score is the age-matched score showing the patient's values compared to a  population matched for age, sex, and race. This is also expressed in terms of standard deviation. The patient may have values that compare favorably to the age-matched values and still be at increased risk for fracture. Electronically Signed   By: Lovey Newcomer M.D.   On: 10/19/2014 14:09   Mm Digital Screening  Result Date: 10/19/2014 CLINICAL DATA:  Screening. EXAM: DIGITAL SCREENING BILATERAL MAMMOGRAM WITH CAD COMPARISON:  Previous exam(s). ACR Breast Density Category a: The breast tissue is almost entirely fatty. FINDINGS: There are no findings suspicious for malignancy. Images were processed with CAD. IMPRESSION: No mammographic evidence of malignancy. A result letter of this screening mammogram will be mailed directly to the patient. RECOMMENDATION: Screening mammogram in one year. (Code:SM-B-01Y) BI-RADS CATEGORY  1: Negative. Electronically Signed   By: Lillia Mountain M.D.   On: 10/19/2014 13:07     Assessment & Plan:  Plan  I have discontinued Ms. Genova sertraline. I have also changed her LORazepam and sertraline. Additionally, I am having her maintain her atorvastatin.  Meds ordered this encounter  Medications  . atorvastatin (LIPITOR) 20 MG tablet    Sig: Take 1 tablet (20 mg total) by mouth daily.    Dispense:  90 tablet    Refill:  3    D/C PREVIOUS SCRIPTS FOR THIS MEDICATION  . LORazepam (ATIVAN) 0.5 MG tablet  Sig: Take 1 tablet (0.5 mg total) by mouth every 8 (eight) hours as needed. for anxiety    Dispense:  90 tablet    Refill:  0    Not to exceed 5 additional fills before 06/11/2016  . sertraline (ZOLOFT) 100 MG tablet    Sig: Take 1.5 tablets (150 mg total) by mouth daily.    Dispense:  135 tablet    Refill:  1    Problem List Items Addressed This Visit    None    Visit Diagnoses    Essential hypertension    -  Primary   Relevant Medications   atorvastatin (LIPITOR) 20 MG tablet   Other Relevant Orders   Comprehensive metabolic panel (Completed)   Lipid  panel (Completed)   CBC with Differential/Platelet (Completed)   POCT urinalysis dipstick   Hyperlipidemia, unspecified hyperlipidemia type       Relevant Medications   atorvastatin (LIPITOR) 20 MG tablet   Other Relevant Orders   Comprehensive metabolic panel (Completed)   Lipid panel (Completed)   Need for hepatitis C screening test       Relevant Orders   Hepatitis C antibody   Encounter for Medicare annual wellness exam       Generalized anxiety disorder       Relevant Medications   LORazepam (ATIVAN) 0.5 MG tablet   sertraline (ZOLOFT) 100 MG tablet      Follow-up: Return in about 6 months (around 10/08/2016) for hyperlipidemia.  [name], DO

## 2016-04-11 LAB — HEPATITIS C ANTIBODY: HCV Ab: NEGATIVE

## 2016-06-12 ENCOUNTER — Other Ambulatory Visit: Payer: Self-pay | Admitting: Family Medicine

## 2016-06-12 DIAGNOSIS — F411 Generalized anxiety disorder: Secondary | ICD-10-CM

## 2016-06-13 NOTE — Telephone Encounter (Signed)
Faxed hardcopy for lorazepam to [facility] on [address]

## 2016-06-13 NOTE — Telephone Encounter (Signed)
Last office visit 04/10/2016 Lorazepam  #90 with  0 refills on 04/10/2016 No Contract/UDS

## 2016-08-29 ENCOUNTER — Other Ambulatory Visit: Payer: Self-pay | Admitting: Family Medicine

## 2016-08-29 DIAGNOSIS — Z1231 Encounter for screening mammogram for malignant neoplasm of breast: Secondary | ICD-10-CM

## 2016-09-08 ENCOUNTER — Other Ambulatory Visit: Payer: Self-pay | Admitting: Family Medicine

## 2016-09-08 DIAGNOSIS — F411 Generalized anxiety disorder: Secondary | ICD-10-CM

## 2016-09-11 NOTE — Telephone Encounter (Signed)
Faxed hardcopy for lorazepam to [facility] on [address]

## 2016-09-11 NOTE — Telephone Encounter (Signed)
Requesting:    Lorazepam Contract     [place] UDS    Low risk next due on 03/30/2016 Last OV      04/10/2016-----future appt is on 10/06/2016 Last Refill       #90 on 06/13/2016  Please Advise

## 2016-09-15 ENCOUNTER — Ambulatory Visit
Admission: RE | Admit: 2016-09-15 | Discharge: 2016-09-15 | Disposition: A | Discharge status: Home / Self Care | Payer: Medicare Other | Source: Ambulatory Visit | Attending: Family Medicine | Admitting: Family Medicine

## 2016-09-15 DIAGNOSIS — Z1231 Encounter for screening mammogram for malignant neoplasm of breast: Secondary | ICD-10-CM | POA: Diagnosis not present

## 2016-10-06 ENCOUNTER — Encounter: Payer: Self-pay | Admitting: Family Medicine

## 2016-10-06 ENCOUNTER — Ambulatory Visit (INDEPENDENT_AMBULATORY_CARE_PROVIDER_SITE_OTHER): Payer: Medicare Other | Admitting: Family Medicine

## 2016-10-06 VITALS — BP 130/80 | HR 61 | Temp 98.0°F | Resp 16 | Ht 69.0 in | Wt 246.6 lb

## 2016-10-06 DIAGNOSIS — I10 Essential (primary) hypertension: Secondary | ICD-10-CM

## 2016-10-06 DIAGNOSIS — E785 Hyperlipidemia, unspecified: Secondary | ICD-10-CM | POA: Diagnosis not present

## 2016-10-06 LAB — COMPREHENSIVE METABOLIC PANEL
ALT: 18 U/L (ref 0–35)
AST: 16 U/L (ref 0–37)
Albumin: 4.4 g/dL (ref 3.5–5.2)
Alkaline Phosphatase: 66 U/L (ref 39–117)
BUN: 17 mg/dL (ref 6–23)
CHLORIDE: 102 meq/L (ref 96–112)
CO2: 30 meq/L (ref 19–32)
CREATININE: 0.76 mg/dL (ref 0.40–1.20)
Calcium: 9.3 mg/dL (ref 8.4–10.5)
GFR: 80.07 mL/min (ref >60.00)
Glucose, Bld: 107 mg/dL — ABNORMAL HIGH (ref 70–99)
POTASSIUM: 4.4 meq/L (ref 3.5–5.1)
SODIUM: 137 meq/L (ref 135–145)
Total Bilirubin: 0.4 mg/dL (ref 0.2–1.2)
Total Protein: 7 g/dL (ref 6.0–8.3)

## 2016-10-06 LAB — LIPID PANEL
CHOL/HDL RATIO: 3
Cholesterol: 184 mg/dL (ref 0–200)
HDL: 62.6 mg/dL (ref >39.00)
LDL Cholesterol: 95 mg/dL (ref 0–99)
NonHDL: 121.64
Triglycerides: 134 mg/dL (ref 0.0–149.0)
VLDL: 26.8 mg/dL (ref 0.0–40.0)

## 2016-10-06 NOTE — Patient Instructions (Signed)

## 2016-10-06 NOTE — Assessment & Plan Note (Signed)
Tolerating statin, encouraged heart healthy diet, avoid trans fats, minimize simple carbs and saturated fats. Increase exercise as tolerated 

## 2016-10-06 NOTE — Progress Notes (Signed)
Patient ID: Allison Cox, female   DOB: 13-Aug-1945, 71 y.o.   MRN: [id number]     Subjective:  I acted as a Education administrator for Dr. Carollee Herter.  Guerry Bruin, [place]   Patient ID: Allison Cox, female    DOB: 04/13/1946, 71 y.o.   MRN: [id number]  Chief Complaint  Patient presents with  . Hypertension  . Hyperlipidemia    HPI  Patient is in today for follow up blood pressure and cholesterol.  Has been doing well on current treatment.  Patient Care Team: Carollee Herter, Alferd Apa, DO as PCP - General   Past Medical History:  Diagnosis Date  . Anxiety   . Colon polyps    hyperplastic and Adenomatous  . Hyperlipidemia   . Osteopenia     Past Surgical History:  Procedure Laterality Date  . CARPAL TUNNEL RELEASE    . COLONOSCOPY    . KNEE ARTHROSCOPY      Family History  Problem Relation Age of Onset  . Coronary artery disease Unknown   . Stroke Mother   . Heart attack Father   . Colon cancer Neg Hx     Social History   Social History  . Marital status: Single    Spouse name: N/A  . Number of children: N/A  . Years of education: N/A   Occupational History  . Not on file.   Social History Main Topics  . Smoking status: Former Smoker    Quit date: 05/15/1986  . Smokeless tobacco: Never Used  . Alcohol use 1.2 oz/week    2 Glasses of wine per week  . Drug use: No  . Sexual activity: Yes    Partners: Male   Other Topics Concern  . Not on file   Social History Narrative  . No narrative on file    Outpatient Medications Prior to Visit  Medication Sig Dispense Refill  . atorvastatin (LIPITOR) 20 MG tablet Take 1 tablet (20 mg total) by mouth daily. 90 tablet 3  . LORazepam (ATIVAN) 0.5 MG tablet TAKE 1 TABLET BY MOUTH EVERY 8 HOURS AS NEEDED FOR PAIN 90 tablet 0  . sertraline (ZOLOFT) 100 MG tablet TAKE 1 AND 1/2 TABLETS BY MOUTH EVERY DAY 135 tablet 1  . sertraline (ZOLOFT) 100 MG tablet Take 1.5 tablets (150 mg total) by mouth daily. 135 tablet 1   No  facility-administered medications prior to visit.     Allergies  Allergen Reactions  . Codeine Nausea And Vomiting    Review of Systems  Constitutional: Negative for fever and malaise/fatigue.  HENT: Negative for congestion.   Eyes: Negative for blurred vision.  Respiratory: Negative for cough and shortness of breath.   Cardiovascular: Negative for chest pain, palpitations and leg swelling.  Gastrointestinal: Negative for vomiting.  Musculoskeletal: Negative for back pain.  Skin: Negative for rash.  Neurological: Negative for loss of consciousness and headaches.       Objective:    Physical Exam  Constitutional: She is oriented to person, place, and time. She appears well-developed and well-nourished. No distress.  HENT:  Head: Normocephalic and atraumatic.  Eyes: Conjunctivae are normal.  Neck: Normal range of motion. No thyromegaly present.  Cardiovascular: Normal rate and regular rhythm.   Pulmonary/Chest: Effort normal and breath sounds normal. She has no wheezes.  Abdominal: Soft. Bowel sounds are normal. There is no tenderness.  Musculoskeletal: Normal range of motion. She exhibits no edema or deformity.  Neurological: She is alert and oriented to person, place,  and time.  Skin: Skin is warm and dry. She is not diaphoretic.  Psychiatric: She has a normal mood and affect.  Nursing note and vitals reviewed.   BP 130/80 (BP Location: Right Arm, Cuff Size: Large)   Pulse 61   Temp 98 F (36.7 C) (Oral)   Resp 16   Ht 5\' 9"  (7.209 m)   Wt 246 lb 9.6 oz (111.9 kg)   SpO2 97%   BMI 36.42 kg/m  Wt Readings from Last 3 Encounters:  10/06/16 246 lb 9.6 oz (111.9 kg)  04/10/16 241 lb 9.6 oz (109.6 kg)  09/28/15 242 lb 12.8 oz (110.1 kg)   BP Readings from Last 3 Encounters:  10/06/16 130/80  04/10/16 112/78  09/28/15 116/78     Immunization History  Administered Date(s) Administered  . Influenza Split 04/18/2012  . Influenza Whole 02/13/2008, 02/16/2009,  02/08/2010  . Influenza, High Dose Seasonal PF 02/29/2016  . Influenza,inj,Quad PF,36+ Mos 02/12/2013, 03/23/2015  . Pneumococcal Conjugate-13 09/28/2015  . Pneumococcal Polysaccharide-23 10/02/2013  . Td 06/29/2003  . Zoster 01/03/2011    Health Maintenance  Topic Date Due  . Samul Dada  06/28/2013  . INFLUENZA VACCINE  12/13/2016  . COLONOSCOPY  07/15/2017  . MAMMOGRAM  09/16/2018  . DEXA SCAN  Completed  . Hepatitis C Screening  Completed  . PNA vac Low Risk Adult  Completed    Lab Results  Component Value Date   WBC 5.9 04/10/2016   HGB 13.7 04/10/2016   HCT 40.1 04/10/2016   PLT 244.0 04/10/2016   GLUCOSE 98 04/10/2016   CHOL 195 04/10/2016   TRIG 129.0 04/10/2016   HDL 69.70 04/10/2016   LDLDIRECT 125.1 10/06/2011   LDLCALC 100 (H) 04/10/2016   ALT 20 04/10/2016   AST 18 04/10/2016   NA 137 04/10/2016   K 4.6 04/10/2016   CL 101 04/10/2016   CREATININE 0.77 04/10/2016   BUN 13 04/10/2016   CO2 28 04/10/2016   TSH 2.68 08/11/2009    Lab Results  Component Value Date   TSH 2.68 08/11/2009   Lab Results  Component Value Date   WBC 5.9 04/10/2016   HGB 13.7 04/10/2016   HCT 40.1 04/10/2016   MCV 87.2 04/10/2016   PLT 244.0 04/10/2016   Lab Results  Component Value Date   NA 137 04/10/2016   K 4.6 04/10/2016   CO2 28 04/10/2016   GLUCOSE 98 04/10/2016   BUN 13 04/10/2016   CREATININE 0.77 04/10/2016   BILITOT 0.4 04/10/2016   ALKPHOS 73 04/10/2016   AST 18 04/10/2016   ALT 20 04/10/2016   PROT 7.8 04/10/2016   ALBUMIN 4.6 04/10/2016   CALCIUM 9.7 04/10/2016   GFR 78.98 04/10/2016   Lab Results  Component Value Date   CHOL 195 04/10/2016   Lab Results  Component Value Date   HDL 69.70 04/10/2016   Lab Results  Component Value Date   LDLCALC 100 (H) 04/10/2016   Lab Results  Component Value Date   TRIG 129.0 04/10/2016   Lab Results  Component Value Date   CHOLHDL 3 04/10/2016   No results found for: HGBA1C        Assessment & Plan:   Problem List Items Addressed This Visit      Unprioritized   Essential hypertension - Primary    Well controlled, no changes to meds. Encouraged heart healthy diet such as the DASH diet and exercise as tolerated.       Relevant Orders  Comprehensive metabolic panel   Hyperlipidemia LDL goal <100    Tolerating statin, encouraged heart healthy diet, avoid trans fats, minimize simple carbs and saturated fats. Increase exercise as tolerated       Other Visit Diagnoses    Hyperlipidemia, unspecified hyperlipidemia type       Relevant Orders   Comprehensive metabolic panel   Lipid panel      I am having Ms. Wible maintain her atorvastatin, sertraline, and LORazepam.  No orders of the defined types were placed in this encounter.   CMA served as Education administrator during this visit. History, Physical and Plan performed by medical provider. Documentation and orders reviewed and attested to.  [name], DO

## 2016-10-06 NOTE — Assessment & Plan Note (Signed)
Well controlled, no changes to meds. Encouraged heart healthy diet such as the DASH diet and exercise as tolerated.  °

## 2016-11-06 ENCOUNTER — Other Ambulatory Visit: Payer: Self-pay | Admitting: Family Medicine

## 2016-11-06 DIAGNOSIS — E785 Hyperlipidemia, unspecified: Secondary | ICD-10-CM

## 2016-12-06 ENCOUNTER — Other Ambulatory Visit: Payer: Self-pay | Admitting: Family Medicine

## 2016-12-07 NOTE — Telephone Encounter (Signed)
Requesting:Zoloft Contract: NO UDSNo Last Ov:10/06/16 Last Refill:06/13/16 #[id number]  Please Advise

## 2016-12-18 ENCOUNTER — Other Ambulatory Visit: Payer: Self-pay | Admitting: Family Medicine

## 2016-12-18 DIAGNOSIS — F411 Generalized anxiety disorder: Secondary | ICD-10-CM

## 2016-12-18 MED ORDER — LORAZEPAM 0.5 MG PO TABS: 0.5000 mg | ORAL_TABLET | Freq: Three times a day (TID) | ORAL | 0 refills | Status: DC | PRN

## 2016-12-18 NOTE — Telephone Encounter (Signed)
Filling for Dr. Etter Sjogren  Requesting:ativan Contract: No UDS: NO Last OV:10/06/16 Last Refill: 09/11/16  #[id number]  Sig: 1 tAB Q8H PRN PAIN  Please Advise

## 2016-12-18 NOTE — Telephone Encounter (Signed)
Needs contract and UDS soon but did fill rx

## 2016-12-19 ENCOUNTER — Encounter: Payer: Self-pay | Admitting: Family Medicine

## 2016-12-19 DIAGNOSIS — Z79899 Other long term (current) drug therapy: Secondary | ICD-10-CM | POA: Diagnosis not present

## 2016-12-19 NOTE — Addendum Note (Signed)
Addended by: Magdalene Molly A on: 12/19/2016 07:43 AM   Modules accepted: Orders

## 2016-12-19 NOTE — Telephone Encounter (Signed)
2 request came in for the same medication. One request came from my chart the other request came electronically. Both request approved and signed by filling provider. Only one will be given to patient the other shredded.   PC

## 2017-01-08 ENCOUNTER — Telehealth: Payer: Self-pay | Admitting: Family Medicine

## 2017-01-08 NOTE — Telephone Encounter (Signed)
Spoke with pt regarding AWV. Pt stated that she will have to give office a call back when she is ready to schedule an appt.

## 2017-03-08 ENCOUNTER — Encounter: Payer: Self-pay | Admitting: Family Medicine

## 2017-03-08 ENCOUNTER — Ambulatory Visit (INDEPENDENT_AMBULATORY_CARE_PROVIDER_SITE_OTHER): Payer: Medicare Other | Admitting: Family Medicine

## 2017-03-08 VITALS — BP 128/88 | Temp 98.0°F | Ht 68.75 in | Wt 244.0 lb

## 2017-03-08 DIAGNOSIS — F411 Generalized anxiety disorder: Secondary | ICD-10-CM

## 2017-03-08 DIAGNOSIS — J302 Other seasonal allergic rhinitis: Secondary | ICD-10-CM

## 2017-03-08 DIAGNOSIS — Z23 Encounter for immunization: Secondary | ICD-10-CM | POA: Diagnosis not present

## 2017-03-08 DIAGNOSIS — E785 Hyperlipidemia, unspecified: Secondary | ICD-10-CM | POA: Diagnosis not present

## 2017-03-08 LAB — LIPID PANEL
CHOL/HDL RATIO: 3
Cholesterol: 179 mg/dL (ref 0–200)
HDL: 57.9 mg/dL (ref >39.00)
LDL CALC: 94 mg/dL (ref 0–99)
NONHDL: 121.25
TRIGLYCERIDES: 137 mg/dL (ref 0.0–149.0)
VLDL: 27.4 mg/dL (ref 0.0–40.0)

## 2017-03-08 LAB — COMPREHENSIVE METABOLIC PANEL
ALK PHOS: 68 U/L (ref 39–117)
ALT: 22 U/L (ref 0–35)
AST: 17 U/L (ref 0–37)
Albumin: 4.4 g/dL (ref 3.5–5.2)
BILIRUBIN TOTAL: 0.4 mg/dL (ref 0.2–1.2)
BUN: 17 mg/dL (ref 6–23)
CALCIUM: 9.3 mg/dL (ref 8.4–10.5)
CHLORIDE: 102 meq/L (ref 96–112)
CO2: 28 meq/L (ref 19–32)
CREATININE: 0.71 mg/dL (ref 0.40–1.20)
GFR: 86.5 mL/min (ref >60.00)
Glucose, Bld: 111 mg/dL — ABNORMAL HIGH (ref 70–99)
Potassium: 4.6 mEq/L (ref 3.5–5.1)
Sodium: 136 mEq/L (ref 135–145)
Total Protein: 7.1 g/dL (ref 6.0–8.3)

## 2017-03-08 MED ORDER — FLUTICASONE PROPIONATE 50 MCG/ACT NA SUSP: 2.0000 | Freq: Every day | NASAL | 6 refills | Status: DC

## 2017-03-08 MED ORDER — LORAZEPAM 0.5 MG PO TABS
0.5000 mg | ORAL_TABLET | Freq: Three times a day (TID) | ORAL | 1 refills | Status: DC | PRN
Start: 2017-03-08 — End: 2017-08-10

## 2017-03-08 MED ORDER — SERTRALINE HCL 100 MG PO TABS: 150.0000 mg | ORAL_TABLET | Freq: Every day | ORAL | 3 refills | Status: DC

## 2017-03-08 NOTE — Patient Instructions (Signed)

## 2017-03-08 NOTE — Assessment & Plan Note (Signed)
flonase  con't antihistamine

## 2017-03-08 NOTE — Assessment & Plan Note (Signed)
Encouraged heart healthy diet, increase exercise, avoid trans fats, consider a krill oil cap daily 

## 2017-03-08 NOTE — Progress Notes (Signed)
Patient ID: Allison Cox, female    DOB: 1945-05-19  Age: 71 y.o. MRN: [id number]    Subjective:  Subjective  HPI Allison Cox presents for labs and c/o congestion and wants to make sure she does not have a sinus infection.  No fever,  Clear mucus.  No sinus pressure.    Review of Systems  Constitutional: Negative for activity change, appetite change, fatigue and unexpected weight change.  HENT: Positive for congestion, rhinorrhea and sore throat. Negative for sinus pain and sinus pressure.   Respiratory: Negative for cough and shortness of breath.   Cardiovascular: Negative for chest pain and palpitations.  Psychiatric/Behavioral: Negative for behavioral problems and dysphoric mood. The patient is not nervous/anxious.     History Past Medical History:  Diagnosis Date  . Anxiety   . Colon polyps    hyperplastic and Adenomatous  . Hyperlipidemia   . Osteopenia     She has a past surgical history that includes Carpal tunnel release; Knee arthroscopy; and Colonoscopy.   Her family history includes Coronary artery disease in her unknown relative; Heart attack in her father; Stroke in her mother.She reports that she quit smoking about 30 years ago. She has never used smokeless tobacco. She reports that she drinks about 1.2 oz of alcohol per week . She reports that she does not use drugs.  Current Outpatient Prescriptions on File Prior to Visit  Medication Sig Dispense Refill  . atorvastatin (LIPITOR) 20 MG tablet TAKE 1 TABLET BY MOUTH EVERY DAY 90 tablet 1   No current facility-administered medications on file prior to visit.      Objective:  Objective  Physical Exam  Constitutional: She is oriented to person, place, and time. She appears well-developed and well-nourished.  HENT:  Right Ear: External ear normal.  Left Ear: External ear normal.  + PND + errythema  Eyes: Conjunctivae are normal. Right eye exhibits no discharge. Left eye exhibits no discharge.    Cardiovascular: Normal rate, regular rhythm and normal heart sounds.   No murmur heard. Pulmonary/Chest: Effort normal and breath sounds normal. No respiratory distress. She has no wheezes. She has no rales. She exhibits no tenderness.  Musculoskeletal: She exhibits no edema.  Lymphadenopathy:    She has no cervical adenopathy.  Neurological: She is alert and oriented to person, place, and time.  Psychiatric: She has a normal mood and affect. Her behavior is normal. Judgment and thought content normal.  Nursing note and vitals reviewed.  BP 128/88   Temp 98 F (36.7 C) (Oral)   Ht 5' 8.75" (1.746 m)   Wt 244 lb (110.7 kg)   SpO2 98%   BMI 36.30 kg/m  Wt Readings from Last 3 Encounters:  03/08/17 244 lb (110.7 kg)  10/06/16 246 lb 9.6 oz (111.9 kg)  04/10/16 241 lb 9.6 oz (109.6 kg)     Lab Results  Component Value Date   WBC 5.9 04/10/2016   HGB 13.7 04/10/2016   HCT 40.1 04/10/2016   PLT 244.0 04/10/2016   GLUCOSE 111 (H) 03/08/2017   CHOL 179 03/08/2017   TRIG 137.0 03/08/2017   HDL 57.90 03/08/2017   LDLDIRECT 125.1 10/06/2011   LDLCALC 94 03/08/2017   ALT 22 03/08/2017   AST 17 03/08/2017   NA 136 03/08/2017   K 4.6 03/08/2017   CL 102 03/08/2017   CREATININE 0.71 03/08/2017   BUN 17 03/08/2017   CO2 28 03/08/2017   TSH 2.68 08/11/2009    Mm Digital  Screening Bilateral  Result Date: 09/15/2016 CLINICAL DATA:  Screening. EXAM: DIGITAL SCREENING BILATERAL MAMMOGRAM WITH CAD COMPARISON:  Previous exam(s). ACR Breast Density Category a: The breast tissue is almost entirely fatty. FINDINGS: There are no findings suspicious for malignancy. Images were processed with CAD. IMPRESSION: No mammographic evidence of malignancy. A result letter of this screening mammogram will be mailed directly to the patient. RECOMMENDATION: Screening mammogram in one year. (Code:SM-B-01Y) BI-RADS CATEGORY  1: Negative. Electronically Signed   By: Ammie Ferrier M.D.   On: 09/15/2016  15:39     Assessment & Plan:  Plan  I have changed Allison Cox sertraline. I am also having her start on fluticasone. Additionally, I am having her maintain her atorvastatin and LORazepam.  Meds ordered this encounter  Medications  . fluticasone (FLONASE) 50 MCG/ACT nasal spray    Sig: Place 2 sprays into both nostrils daily.    Dispense:  16 g    Refill:  6  . LORazepam (ATIVAN) 0.5 MG tablet    Sig: Take 1 tablet (0.5 mg total) by mouth every 8 (eight) hours as needed. for pain    Dispense:  90 tablet    Refill:  1    Not to exceed 5 additional fills before 12/10/2016  . sertraline (ZOLOFT) 100 MG tablet    Sig: Take 1.5 tablets (150 mg total) by mouth daily.    Dispense:  135 tablet    Refill:  3    Problem List Items Addressed This Visit      Unprioritized   Hyperlipidemia LDL goal <100    Encouraged heart healthy diet, increase exercise, avoid trans fats, consider a krill oil cap daily      Relevant Orders   Lipid panel (Completed)   Comprehensive metabolic panel (Completed)   Seasonal allergies    flonase  con't antihistamine      Relevant Medications   fluticasone (FLONASE) 50 MCG/ACT nasal spray    Other Visit Diagnoses    Need for immunization against influenza    -  Primary   Relevant Orders   Flu vaccine HIGH DOSE PF (Fluzone High dose) (Completed)   Generalized anxiety disorder       Relevant Medications   LORazepam (ATIVAN) 0.5 MG tablet   sertraline (ZOLOFT) 100 MG tablet      Follow-up: Return in about 6 months (around 09/06/2017), or if symptoms worsen or fail to improve, for fasting, hyperlipidemia.  [name], DO

## 2017-05-12 ENCOUNTER — Other Ambulatory Visit: Payer: Self-pay | Admitting: Family Medicine

## 2017-05-12 DIAGNOSIS — E785 Hyperlipidemia, unspecified: Secondary | ICD-10-CM

## 2017-05-18 DIAGNOSIS — M67471 Ganglion, right ankle and foot: Secondary | ICD-10-CM | POA: Diagnosis not present

## 2017-05-18 DIAGNOSIS — M7751 Other enthesopathy of right foot: Secondary | ICD-10-CM | POA: Diagnosis not present

## 2017-05-18 DIAGNOSIS — M19071 Primary osteoarthritis, right ankle and foot: Secondary | ICD-10-CM | POA: Diagnosis not present

## 2017-05-31 DIAGNOSIS — M67471 Ganglion, right ankle and foot: Secondary | ICD-10-CM | POA: Diagnosis not present

## 2017-08-10 ENCOUNTER — Ambulatory Visit (INDEPENDENT_AMBULATORY_CARE_PROVIDER_SITE_OTHER): Payer: Medicare Other | Admitting: Family Medicine

## 2017-08-10 ENCOUNTER — Encounter: Payer: Self-pay | Admitting: Family Medicine

## 2017-08-10 VITALS — BP 130/80 | HR 57 | Temp 98.5°F | Resp 16 | Ht 69.0 in | Wt 241.6 lb

## 2017-08-10 DIAGNOSIS — Z86010 Personal history of colonic polyps: Secondary | ICD-10-CM

## 2017-08-10 DIAGNOSIS — F411 Generalized anxiety disorder: Secondary | ICD-10-CM

## 2017-08-10 DIAGNOSIS — Z79899 Other long term (current) drug therapy: Secondary | ICD-10-CM | POA: Diagnosis not present

## 2017-08-10 DIAGNOSIS — E785 Hyperlipidemia, unspecified: Secondary | ICD-10-CM

## 2017-08-10 LAB — LIPID PANEL
Cholesterol: 169 mg/dL (ref 0–200)
HDL: 58.9 mg/dL (ref >39.00)
LDL Cholesterol: 88 mg/dL (ref 0–99)
NONHDL: 110.08
Total CHOL/HDL Ratio: 3
Triglycerides: 111 mg/dL (ref 0.0–149.0)
VLDL: 22.2 mg/dL (ref 0.0–40.0)

## 2017-08-10 LAB — COMPREHENSIVE METABOLIC PANEL
ALK PHOS: 63 U/L (ref 39–117)
ALT: 17 U/L (ref 0–35)
AST: 16 U/L (ref 0–37)
Albumin: 4.1 g/dL (ref 3.5–5.2)
BILIRUBIN TOTAL: 0.4 mg/dL (ref 0.2–1.2)
BUN: 17 mg/dL (ref 6–23)
CO2: 30 mEq/L (ref 19–32)
Calcium: 9.2 mg/dL (ref 8.4–10.5)
Chloride: 103 mEq/L (ref 96–112)
Creatinine, Ser: 0.72 mg/dL (ref 0.40–1.20)
GFR: 85.01 mL/min (ref >60.00)
GLUCOSE: 122 mg/dL — AB (ref 70–99)
Potassium: 4.4 mEq/L (ref 3.5–5.1)
Sodium: 138 mEq/L (ref 135–145)
TOTAL PROTEIN: 7.1 g/dL (ref 6.0–8.3)

## 2017-08-10 MED ORDER — ATORVASTATIN CALCIUM 20 MG PO TABS: 20.0000 mg | ORAL_TABLET | Freq: Every day | ORAL | 1 refills | Status: DC

## 2017-08-10 MED ORDER — LORAZEPAM 0.5 MG PO TABS: 0.5000 mg | ORAL_TABLET | Freq: Three times a day (TID) | ORAL | 1 refills | Status: DC | PRN

## 2017-08-10 MED ORDER — SERTRALINE HCL 100 MG PO TABS: 150.0000 mg | ORAL_TABLET | Freq: Every day | ORAL | 1 refills | Status: DC

## 2017-08-10 NOTE — Assessment & Plan Note (Signed)
Stable  con't zoloft and prn ativan  

## 2017-08-10 NOTE — Assessment & Plan Note (Signed)
Tolerating statin, encouraged heart healthy diet, avoid trans fats, minimize simple carbs and saturated fats. Increase exercise as tolerated 

## 2017-08-10 NOTE — Progress Notes (Signed)
Patient ID: Allison Cox, female    DOB: 20-Nov-1945  Age: 72 y.o. MRN: [id number]    Subjective:  Subjective  HPI Sahmya Arai presents for f/u cholesterol , anxiety.  No complaints.    Review of Systems  Constitutional: Negative for activity change, appetite change, diaphoresis, fatigue and unexpected weight change.  Eyes: Negative for pain, redness and visual disturbance.  Respiratory: Negative for cough, chest tightness, shortness of breath and wheezing.   Cardiovascular: Negative for chest pain, palpitations and leg swelling.  Endocrine: Negative for cold intolerance, heat intolerance, polydipsia, polyphagia and polyuria.  Genitourinary: Negative for difficulty urinating, dysuria and frequency.  Neurological: Negative for dizziness, light-headedness, numbness and headaches.  Psychiatric/Behavioral: Negative for behavioral problems and dysphoric mood. The patient is not nervous/anxious.     History Past Medical History:  Diagnosis Date  . Anxiety   . Colon polyps    hyperplastic and Adenomatous  . Hyperlipidemia   . Osteopenia     She has a past surgical history that includes Carpal tunnel release; Knee arthroscopy; and Colonoscopy.   Her family history includes Coronary artery disease in her unknown relative; Heart attack in her father; Stroke in her mother.She reports that she quit smoking about 31 years ago. She has never used smokeless tobacco. She reports that she drinks about 1.2 oz of alcohol per week. She reports that she does not use drugs.  No current outpatient medications on file prior to visit.   No current facility-administered medications on file prior to visit.      Objective:  Objective  Physical Exam  Constitutional: She is oriented to person, place, and time. She appears well-developed and well-nourished.  HENT:  Head: Normocephalic and atraumatic.  Eyes: Conjunctivae and EOM are normal.  Neck: Normal range of motion. Neck supple. No JVD  present. Carotid bruit is not present. No thyromegaly present.  Cardiovascular: Normal rate, regular rhythm and normal heart sounds.  No murmur heard. Pulmonary/Chest: Effort normal and breath sounds normal. No respiratory distress. She has no wheezes. She has no rales. She exhibits no tenderness.  Musculoskeletal: She exhibits no edema.  Neurological: She is alert and oriented to person, place, and time.  Psychiatric: She has a normal mood and affect. Her behavior is normal. Judgment and thought content normal.  Nursing note and vitals reviewed.  BP 130/80 (BP Location: Right Arm, Cuff Size: Large)   Pulse (!) 57   Temp 98.5 F (36.9 C) (Oral)   Resp 16   Ht 5\' 9"  (1.753 m)   Wt 241 lb 9.6 oz (109.6 kg)   SpO2 96%   BMI 35.68 kg/m  Wt Readings from Last 3 Encounters:  08/10/17 241 lb 9.6 oz (109.6 kg)  03/08/17 244 lb (110.7 kg)  10/06/16 246 lb 9.6 oz (111.9 kg)     Lab Results  Component Value Date   WBC 5.9 04/10/2016   HGB 13.7 04/10/2016   HCT 40.1 04/10/2016   PLT 244.0 04/10/2016   GLUCOSE 111 (H) 03/08/2017   CHOL 179 03/08/2017   TRIG 137.0 03/08/2017   HDL 57.90 03/08/2017   LDLDIRECT 125.1 10/06/2011   LDLCALC 94 03/08/2017   ALT 22 03/08/2017   AST 17 03/08/2017   NA 136 03/08/2017   K 4.6 03/08/2017   CL 102 03/08/2017   CREATININE 0.71 03/08/2017   BUN 17 03/08/2017   CO2 28 03/08/2017   TSH 2.68 08/11/2009    Mm Digital Screening Bilateral  Result Date: 09/15/2016 CLINICAL DATA:  Screening. EXAM: DIGITAL SCREENING BILATERAL MAMMOGRAM WITH CAD COMPARISON:  Previous exam(s). ACR Breast Density Category a: The breast tissue is almost entirely fatty. FINDINGS: There are no findings suspicious for malignancy. Images were processed with CAD. IMPRESSION: No mammographic evidence of malignancy. A result letter of this screening mammogram will be mailed directly to the patient. RECOMMENDATION: Screening mammogram in one year. (Code:SM-B-01Y) BI-RADS CATEGORY   1: Negative. Electronically Signed   By: Ammie Ferrier M.D.   On: 09/15/2016 15:39     Assessment & Plan:  Plan  I have discontinued Synetta Fail "Gay"'s fluticasone. I have also changed her atorvastatin. Additionally, I am having her maintain her sertraline and LORazepam.  Meds ordered this encounter  Medications  . DISCONTD: LORazepam (ATIVAN) 0.5 MG tablet    Sig: Take 1 tablet (0.5 mg total) by mouth every 8 (eight) hours as needed for anxiety.    Dispense:  90 tablet    Refill:  1  . atorvastatin (LIPITOR) 20 MG tablet    Sig: Take 1 tablet (20 mg total) by mouth daily.    Dispense:  90 tablet    Refill:  1  . sertraline (ZOLOFT) 100 MG tablet    Sig: Take 1.5 tablets (150 mg total) by mouth daily.    Dispense:  135 tablet    Refill:  1  . LORazepam (ATIVAN) 0.5 MG tablet    Sig: Take 1 tablet (0.5 mg total) by mouth every 8 (eight) hours as needed for anxiety.    Dispense:  90 tablet    Refill:  1    Problem List Items Addressed This Visit      Unprioritized   Generalized anxiety disorder    Stable con't zoloft and prn ativan      Relevant Medications   sertraline (ZOLOFT) 100 MG tablet   LORazepam (ATIVAN) 0.5 MG tablet   Hyperlipidemia LDL goal <100 - Primary    Tolerating statin, encouraged heart healthy diet, avoid trans fats, minimize simple carbs and saturated fats. Increase exercise as tolerated      Relevant Medications   atorvastatin (LIPITOR) 20 MG tablet   Other Relevant Orders   Comprehensive metabolic panel   Lipid panel    Other Visit Diagnoses    Hyperlipidemia       Relevant Medications   atorvastatin (LIPITOR) 20 MG tablet   High risk medication use       Relevant Orders   Pain Mgmt, Profile 8 w/Conf, U   History of colon polyps       Relevant Orders   Ambulatory referral to Gastroenterology      Follow-up: Return in about 6 months (around 02/10/2018), or if symptoms worsen or fail to improve, for hyperlipidemia,  AWV.  [name], DO

## 2017-08-10 NOTE — Patient Instructions (Signed)

## 2017-08-13 LAB — PAIN MGMT, PROFILE 8 W/CONF, U
6 Acetylmorphine: NEGATIVE ng/mL (ref <10)
ALCOHOL METABOLITES: NEGATIVE ng/mL (ref <500)
ALPHAHYDROXYALPRAZOLAM: NEGATIVE ng/mL (ref <25)
Alphahydroxymidazolam: NEGATIVE ng/mL (ref <50)
Alphahydroxytriazolam: NEGATIVE ng/mL (ref <50)
Aminoclonazepam: NEGATIVE ng/mL (ref <25)
Amphetamines: NEGATIVE ng/mL (ref <500)
BUPRENORPHINE, URINE: NEGATIVE ng/mL (ref <5)
Benzodiazepines: POSITIVE ng/mL — AB (ref <100)
COCAINE METABOLITE: NEGATIVE ng/mL (ref <150)
Creatinine: 82.4 mg/dL
HYDROXYETHYLFLURAZEPAM: NEGATIVE ng/mL (ref <50)
LORAZEPAM: 223 ng/mL — AB (ref <50)
MARIJUANA METABOLITE: NEGATIVE ng/mL (ref <20)
MDMA: NEGATIVE ng/mL (ref <500)
Nordiazepam: NEGATIVE ng/mL (ref <50)
Opiates: NEGATIVE ng/mL (ref <100)
Oxazepam: NEGATIVE ng/mL (ref <50)
Oxidant: NEGATIVE ug/mL (ref <200)
Oxycodone: NEGATIVE ng/mL (ref <100)
TEMAZEPAM: NEGATIVE ng/mL (ref <50)
pH: 6.27 (ref 4.5–9.0)

## 2017-08-27 ENCOUNTER — Encounter: Payer: Self-pay | Admitting: Internal Medicine

## 2017-09-17 ENCOUNTER — Encounter: Payer: Self-pay | Admitting: Internal Medicine

## 2017-11-20 ENCOUNTER — Other Ambulatory Visit: Payer: Self-pay

## 2017-11-20 ENCOUNTER — Ambulatory Visit (AMBULATORY_SURGERY_CENTER): Payer: Self-pay | Admitting: *Deleted

## 2017-11-20 VITALS — Ht 67.75 in | Wt 239.0 lb

## 2017-11-20 DIAGNOSIS — Z86010 Personal history of colonic polyps: Secondary | ICD-10-CM

## 2017-11-20 MED ORDER — SUPREP BOWEL PREP KIT 17.5-3.13-1.6 GM/177ML PO SOLN: 1.0000 | Freq: Once | ORAL | 0 refills | Status: AC

## 2017-11-20 NOTE — Progress Notes (Signed)
Patient denies any allergies to egg or soy products. Patient denies complications with anesthesia/sedation.  Patient denies oxygen use at home and denies diet medications. Patient denies information on colonoscopy.  Suprep sample given to patient.

## 2017-12-04 ENCOUNTER — Ambulatory Visit (AMBULATORY_SURGERY_CENTER): Payer: Medicare Other | Admitting: Internal Medicine

## 2017-12-04 ENCOUNTER — Encounter: Payer: Self-pay | Admitting: Internal Medicine

## 2017-12-04 VITALS — BP 113/56 | HR 53 | Temp 96.6°F | Resp 12 | Ht 67.0 in | Wt 239.0 lb

## 2017-12-04 DIAGNOSIS — Z86010 Personal history of colonic polyps: Secondary | ICD-10-CM | POA: Diagnosis not present

## 2017-12-04 DIAGNOSIS — K621 Rectal polyp: Secondary | ICD-10-CM | POA: Diagnosis not present

## 2017-12-04 DIAGNOSIS — K620 Anal polyp: Secondary | ICD-10-CM | POA: Diagnosis not present

## 2017-12-04 DIAGNOSIS — D123 Benign neoplasm of transverse colon: Secondary | ICD-10-CM | POA: Diagnosis not present

## 2017-12-04 DIAGNOSIS — D122 Benign neoplasm of ascending colon: Secondary | ICD-10-CM | POA: Diagnosis not present

## 2017-12-04 DIAGNOSIS — D128 Benign neoplasm of rectum: Secondary | ICD-10-CM

## 2017-12-04 DIAGNOSIS — K635 Polyp of colon: Secondary | ICD-10-CM | POA: Diagnosis not present

## 2017-12-04 DIAGNOSIS — D129 Benign neoplasm of anus and anal canal: Secondary | ICD-10-CM

## 2017-12-04 MED ORDER — SODIUM CHLORIDE 0.9 % IV SOLN: 500.0000 mL | Freq: Once | INTRAVENOUS | Status: DC

## 2017-12-04 NOTE — Progress Notes (Signed)
Called to room to assist during endoscopic procedure.  Patient ID and intended procedure confirmed with present staff. Received instructions for my participation in the procedure from the performing physician.  

## 2017-12-04 NOTE — Op Note (Signed)
[facility] Patient Name: Allison Cox Procedure Date: 12/04/2017 10:20 AM MRN: [id number] Endoscopist: Docia Chuck. Henrene Pastor , MD Age: 72 Referring MD:  Date of Birth: 12-20-1945 Gender: Female Account #: [id number] Procedure:                Colonoscopy, With cold snare polypectomy x 4 Indications:              High risk colon cancer surveillance: Personal                            history of multiple (3 or more) adenomas. Previous                            examinations 2005, 2010, 2014 Medicines:                Monitored Anesthesia Care Procedure:                Pre-Anesthesia Assessment:                           - Prior to the procedure, a History and Physical                            was performed, and patient medications and                            allergies were reviewed. The patient's tolerance of                            previous anesthesia was also reviewed. The risks                            and benefits of the procedure and the sedation                            options and risks were discussed with the patient.                            All questions were answered, and informed consent                            was obtained. Prior Anticoagulants: The patient has                            taken no previous anticoagulant or antiplatelet                            agents. ASA Grade Assessment: II - A patient with                            mild systemic disease. After reviewing the risks                            and benefits, the patient was deemed in  satisfactory condition to undergo the procedure.                           After obtaining informed consent, the colonoscope                            was passed under direct vision. Throughout the                            procedure, the patient's blood pressure, pulse, and                            oxygen saturations were monitored continuously. The   Colonoscope was introduced through the anus and                            advanced to the the cecum, identified by                            appendiceal orifice and ileocecal valve. The                            ileocecal valve, appendiceal orifice, and rectum                            were photographed. The quality of the bowel                            preparation was excellent. The colonoscopy was                            performed without difficulty. The patient tolerated                            the procedure well. The bowel preparation used was                            SUPREP. Scope In: 10:39:11 AM Scope Out: 10:53:41 AM Scope Withdrawal Time: 0 hours 10 minutes 23 seconds  Total Procedure Duration: 0 hours 14 minutes 30 seconds  Findings:                 Four polyps were found in the rectum, transverse                            colon and ascending colon. The polyps were 2 to 5                            mm in size. These polyps were removed with a cold                            snare. Resection and retrieval were complete.                           The exam was  otherwise without abnormality on                            direct and retroflexion views. Hypertrophic anal                            papilla present. Complications:            No immediate complications. Estimated blood loss:                            None. Estimated Blood Loss:     Estimated blood loss: none. Impression:               - Four 2 to 5 mm polyps in the rectum, in the                            transverse colon and in the ascending colon,                            removed with a cold snare. Resected and retrieved.                           - The examination was otherwise normal on direct                            and retroflexion views. Hypertrophic anal papilla                            present. Recommendation:           - Repeat colonoscopy in 3 or 5 years for                             surveillance, based on pathology.                           - Patient has a contact number available for                            emergencies. The signs and symptoms of potential                            delayed complications were discussed with the                            patient. Return to normal activities tomorrow.                            Written discharge instructions were provided to the                            patient.                           - Resume previous diet.                           -  Continue present medications.                           - Await pathology results. Docia Chuck. Henrene Pastor, MD 12/04/2017 11:00:14 AM This report has been signed electronically.

## 2017-12-04 NOTE — Patient Instructions (Signed)
Handout given on polyps  YOU HAD AN ENDOSCOPIC PROCEDURE TODAY: Refer to the procedure report and other information in the discharge instructions given to you for any specific questions about what was found during the examination. If this information does not answer your questions, please call [place] office at [phone] to clarify.   YOU SHOULD EXPECT: Some feelings of bloating in the abdomen. Passage of more gas than usual. Walking can help get rid of the air that was put into your GI tract during the procedure and reduce the bloating. If you had a lower endoscopy (such as a colonoscopy or flexible sigmoidoscopy) you may notice spotting of blood in your stool or on the toilet paper. Some abdominal soreness may be present for a day or two, also.  DIET: Your first meal following the procedure should be a light meal and then it is ok to progress to your normal diet. A half-sandwich or bowl of soup is an example of a good first meal. Heavy or fried foods are harder to digest and may make you feel nauseous or bloated. Drink plenty of fluids but you should avoid alcoholic beverages for 24 hours. If you had a esophageal dilation, please see attached instructions for diet.    ACTIVITY: Your care partner should take you home directly after the procedure. You should plan to take it easy, moving slowly for the rest of the day. You can resume normal activity the day after the procedure however YOU SHOULD NOT DRIVE, use power tools, machinery or perform tasks that involve climbing or major physical exertion for 24 hours (because of the sedation medicines used during the test).   SYMPTOMS TO REPORT IMMEDIATELY: A gastroenterologist can be reached at any hour. Please call [phone]  for any of the following symptoms:  Following lower endoscopy (colonoscopy, flexible sigmoidoscopy) Excessive amounts of blood in the stool  Significant tenderness, worsening of abdominal pains  Swelling of the abdomen that is  new, acute  Fever of 100 or higher    FOLLOW UP:  If any biopsies were taken you will be contacted by phone or by letter within the next 1-3 weeks. Call [phone]  if you have not heard about the biopsies in 3 weeks.  Please also call with any specific questions about appointments or follow up tests.  

## 2017-12-04 NOTE — Progress Notes (Signed)
Report given to PACU, vss 

## 2017-12-05 ENCOUNTER — Telehealth: Payer: Self-pay | Admitting: *Deleted

## 2017-12-05 NOTE — Telephone Encounter (Signed)
  Follow up Call-  Call back number 12/04/2017  Post procedure Call Back phone  # [phone]  Permission to leave phone message Yes  Some recent data might be hidden     Patient questions:  Do you have a fever, pain , or abdominal swelling? No. Pain Score  0 *  Have you tolerated food without any problems? Yes.    Have you been able to return to your normal activities? Yes.    Do you have any questions about your discharge instructions: Diet   No. Medications  No. Follow up visit  No.  Do you have questions or concerns about your Care? No.  Actions: * If pain score is 4 or above: No action needed, pain <4.

## 2017-12-10 ENCOUNTER — Encounter: Payer: Self-pay | Admitting: Internal Medicine

## 2018-01-09 DIAGNOSIS — M5417 Radiculopathy, lumbosacral region: Secondary | ICD-10-CM | POA: Diagnosis not present

## 2018-01-09 DIAGNOSIS — M9904 Segmental and somatic dysfunction of sacral region: Secondary | ICD-10-CM | POA: Diagnosis not present

## 2018-01-09 DIAGNOSIS — M9903 Segmental and somatic dysfunction of lumbar region: Secondary | ICD-10-CM | POA: Diagnosis not present

## 2018-01-09 DIAGNOSIS — M545 Low back pain: Secondary | ICD-10-CM | POA: Diagnosis not present

## 2018-01-10 ENCOUNTER — Encounter: Payer: Self-pay | Admitting: Family Medicine

## 2018-01-10 ENCOUNTER — Ambulatory Visit (INDEPENDENT_AMBULATORY_CARE_PROVIDER_SITE_OTHER): Payer: Medicare Other | Admitting: Family Medicine

## 2018-01-10 VITALS — BP 124/55 | HR 56 | Temp 98.7°F | Resp 16 | Ht 67.0 in | Wt 243.0 lb

## 2018-01-10 DIAGNOSIS — R1011 Right upper quadrant pain: Secondary | ICD-10-CM

## 2018-01-10 LAB — POC URINALSYSI DIPSTICK (AUTOMATED)
Bilirubin, UA: NEGATIVE
Blood, UA: NEGATIVE
Glucose, UA: NEGATIVE
KETONES UA: NEGATIVE
LEUKOCYTES UA: NEGATIVE
Nitrite, UA: NEGATIVE
PROTEIN UA: NEGATIVE
Spec Grav, UA: >=1.03 — AB (ref 1.010–1.025)
Urobilinogen, UA: 0.2 E.U./dL
pH, UA: 6 (ref 5.0–8.0)

## 2018-01-10 NOTE — Progress Notes (Signed)
Patient ID: Allison Cox, female   DOB: 30-Apr-1946, 72 y.o.   MRN: [id number]     Subjective:  I acted as a Education administrator for Dr. Carollee Herter.  Allison Cox, [place]   Patient ID: Allison Cox, female    DOB: 01-03-1946, 72 y.o.   MRN: [id number]  Chief Complaint  Patient presents with  . Abdominal Pain    Abdominal Pain  Episode onset: over the weekend. The problem occurs intermittently. The pain is located in the RUQ. The quality of the pain is a sensation of fullness. Associated symptoms include nausea. Pertinent negatives include no belching, diarrhea, dysuria, fever, flatus, frequency, headaches, hematochezia, hematuria or melena.   Patient is in today for RUQ -- she is ok today but had an episode earlier this week with nausea and Ruq pain.    Past Medical History:  Diagnosis Date  . Anxiety   . Arthritis    knee  . Colon polyps    hyperplastic and Adenomatous  . Hyperlipidemia   . Osteopenia     Past Surgical History:  Procedure Laterality Date  . CARPAL TUNNEL RELEASE Left   . COLONOSCOPY  07/2012   Henrene Pastor - polyps  . KNEE ARTHROSCOPY Right   . WISDOM TOOTH EXTRACTION      Family History  Problem Relation Age of Onset  . Coronary artery disease Unknown   . Stroke Mother   . Heart attack Father   . Colon cancer Neg Hx   . Rectal cancer Neg Hx   . Stomach cancer Neg Hx     Social History   Socioeconomic History  . Marital status: Single    Spouse name: Not on file  . Number of children: Not on file  . Years of education: Not on file  . Highest education level: Not on file  Occupational History  . Not on file  Social Needs  . Financial resource strain: Not on file  . Food insecurity:    Worry: Not on file    Inability: Not on file  . Transportation needs:    Medical: Not on file    Non-medical: Not on file  Tobacco Use  . Smoking status: Former Smoker    Types: Cigarettes    Last attempt to quit: 05/15/1986    Years since quitting: 31.6  . Smokeless  tobacco: Never Used  Substance and Sexual Activity  . Alcohol use: Yes    Alcohol/week: 3.0 - 4.0 standard drinks    Types: 3 - 4 Glasses of wine per week  . Drug use: No  . Sexual activity: Yes    Partners: Male    Birth control/protection: Post-menopausal  Lifestyle  . Physical activity:    Days per week: Not on file    Minutes per session: Not on file  . Stress: Not on file  Relationships  . Social connections:    Talks on phone: Not on file    Gets together: Not on file    Attends religious service: Not on file    Active member of club or organization: Not on file    Attends meetings of clubs or organizations: Not on file    Relationship status: Not on file  . Intimate partner violence:    Fear of current or ex partner: Not on file    Emotionally abused: Not on file    Physically abused: Not on file    Forced sexual activity: Not on file  Other Topics Concern  . Not on  file  Social History Narrative  . Not on file    Outpatient Medications Prior to Visit  Medication Sig Dispense Refill  . atorvastatin (LIPITOR) 20 MG tablet Take 1 tablet (20 mg total) by mouth daily. 90 tablet 1  . LORazepam (ATIVAN) 0.5 MG tablet Take 1 tablet (0.5 mg total) by mouth every 8 (eight) hours as needed for anxiety. 90 tablet 1  . sertraline (ZOLOFT) 100 MG tablet Take 1.5 tablets (150 mg total) by mouth daily. 135 tablet 1   Facility-Administered Medications Prior to Visit  Medication Dose Route Frequency Provider Last Rate Last Dose  . 0.9 %  sodium chloride infusion  500 mL Intravenous Once Irene Shipper, MD        Allergies  Allergen Reactions  . Codeine Nausea And Vomiting    Review of Systems  Constitutional: Negative for fever.  Gastrointestinal: Positive for abdominal pain and nausea. Negative for diarrhea, flatus, hematochezia and melena.  Genitourinary: Negative for dysuria, frequency and hematuria.  Neurological: Negative for headaches.       Objective:      Physical Exam  Constitutional: She is oriented to person, place, and time. She appears well-developed and well-nourished.  HENT:  Head: Normocephalic and atraumatic.  Eyes: Conjunctivae and EOM are normal.  Neck: Normal range of motion. Neck supple. No JVD present. Carotid bruit is not present. No thyromegaly present.  Cardiovascular: Normal rate, regular rhythm and normal heart sounds.  No murmur heard. Pulmonary/Chest: Effort normal and breath sounds normal. No respiratory distress. She has no wheezes. She has no rales. She exhibits no tenderness.  Abdominal: Soft. There is no tenderness. There is no rigidity, no rebound, no guarding, no CVA tenderness, no tenderness at McBurney's point and negative Murphy's sign.  Musculoskeletal: She exhibits no edema.  Neurological: She is alert and oriented to person, place, and time.  Psychiatric: She has a normal mood and affect.  Nursing note and vitals reviewed.   BP (!) 124/55 (BP Location: Right Arm, Cuff Size: Large)   Pulse (!) 56   Temp 98.7 F (37.1 C) (Oral)   Resp 16   Ht 5\' 7"  (1.702 m)   Wt 243 lb (110.2 kg)   SpO2 99%   BMI 38.06 kg/m  Wt Readings from Last 3 Encounters:  01/10/18 243 lb (110.2 kg)  12/04/17 239 lb (108.4 kg)  11/20/17 239 lb (108.4 kg)     Lab Results  Component Value Date   WBC 5.9 04/10/2016   HGB 13.7 04/10/2016   HCT 40.1 04/10/2016   PLT 244.0 04/10/2016   GLUCOSE 122 (H) 08/10/2017   CHOL 169 08/10/2017   TRIG 111.0 08/10/2017   HDL 58.90 08/10/2017   LDLDIRECT 125.1 10/06/2011   LDLCALC 88 08/10/2017   ALT 17 08/10/2017   AST 16 08/10/2017   NA 138 08/10/2017   K 4.4 08/10/2017   CL 103 08/10/2017   CREATININE 0.72 08/10/2017   BUN 17 08/10/2017   CO2 30 08/10/2017   TSH 2.68 08/11/2009    Lab Results  Component Value Date   TSH 2.68 08/11/2009   Lab Results  Component Value Date   WBC 5.9 04/10/2016   HGB 13.7 04/10/2016   HCT 40.1 04/10/2016   MCV 87.2 04/10/2016   PLT  244.0 04/10/2016   Lab Results  Component Value Date   NA 138 08/10/2017   K 4.4 08/10/2017   CO2 30 08/10/2017   GLUCOSE 122 (H) 08/10/2017   BUN 17 08/10/2017  CREATININE 0.72 08/10/2017   BILITOT 0.4 08/10/2017   ALKPHOS 63 08/10/2017   AST 16 08/10/2017   ALT 17 08/10/2017   PROT 7.1 08/10/2017   ALBUMIN 4.1 08/10/2017   CALCIUM 9.2 08/10/2017   GFR 85.01 08/10/2017   Lab Results  Component Value Date   CHOL 169 08/10/2017   Lab Results  Component Value Date   HDL 58.90 08/10/2017   Lab Results  Component Value Date   LDLCALC 88 08/10/2017   Lab Results  Component Value Date   TRIG 111.0 08/10/2017   Lab Results  Component Value Date   CHOLHDL 3 08/10/2017   No results found for: HGBA1C     Assessment & Plan:   Problem List Items Addressed This Visit    None    Visit Diagnoses    RUQ pain    -  Primary   Relevant Orders   US Abdomen Limited RUQ   CBC with Differential/Platelet   Comprehensive metabolic panel   Amylase   Lipase   POCT Urinalysis Dipstick (Automated)      I am having Synetta Fail "Gay" maintain her atorvastatin, sertraline, and LORazepam. We will continue to administer sodium chloride.  No orders of the defined types were placed in this encounter.   CMA served as Education administrator during this visit. History, Physical and Plan performed by medical provider. Documentation and orders reviewed and attested to.  [name], DO

## 2018-01-10 NOTE — Patient Instructions (Signed)

## 2018-01-11 ENCOUNTER — Ambulatory Visit (HOSPITAL_BASED_OUTPATIENT_CLINIC_OR_DEPARTMENT_OTHER)
Admission: RE | Admit: 2018-01-11 | Discharge: 2018-01-11 | Disposition: A | Discharge status: Home / Self Care | Payer: Medicare Other | Source: Ambulatory Visit | Attending: Family Medicine | Admitting: Family Medicine

## 2018-01-11 DIAGNOSIS — R1011 Right upper quadrant pain: Secondary | ICD-10-CM

## 2018-01-11 LAB — COMPREHENSIVE METABOLIC PANEL
ALT: 16 U/L (ref 0–35)
AST: 16 U/L (ref 0–37)
Albumin: 4.6 g/dL (ref 3.5–5.2)
Alkaline Phosphatase: 68 U/L (ref 39–117)
BUN: 12 mg/dL (ref 6–23)
CALCIUM: 9.4 mg/dL (ref 8.4–10.5)
CHLORIDE: 101 meq/L (ref 96–112)
CO2: 31 meq/L (ref 19–32)
Creatinine, Ser: 0.76 mg/dL (ref 0.40–1.20)
GFR: 79.77 mL/min (ref >60.00)
Glucose, Bld: 81 mg/dL (ref 70–99)
POTASSIUM: 4.4 meq/L (ref 3.5–5.1)
Sodium: 138 mEq/L (ref 135–145)
Total Bilirubin: 0.5 mg/dL (ref 0.2–1.2)
Total Protein: 7.1 g/dL (ref 6.0–8.3)

## 2018-01-11 LAB — CBC WITH DIFFERENTIAL/PLATELET
BASOS PCT: 1.1 % (ref 0.0–3.0)
Basophils Absolute: 0.1 10*3/uL (ref 0.0–0.1)
Eosinophils Absolute: 0.1 10*3/uL (ref 0.0–0.7)
Eosinophils Relative: 1.9 % (ref 0.0–5.0)
HCT: 38.7 % (ref 36.0–46.0)
Hemoglobin: 13 g/dL (ref 12.0–15.0)
Lymphocytes Relative: 16.6 % (ref 12.0–46.0)
Lymphs Abs: 0.9 10*3/uL (ref 0.7–4.0)
MCHC: 33.7 g/dL (ref 30.0–36.0)
MCV: 89.3 fl (ref 78.0–100.0)
MONOS PCT: 6.5 % (ref 3.0–12.0)
Monocytes Absolute: 0.3 10*3/uL (ref 0.1–1.0)
NEUTROS ABS: 3.9 10*3/uL (ref 1.4–7.7)
Neutrophils Relative %: 73.9 % (ref 43.0–77.0)
PLATELETS: 221 10*3/uL (ref 150.0–400.0)
RBC: 4.33 Mil/uL (ref 3.87–5.11)
RDW: 14.1 % (ref 11.5–15.5)
WBC: 5.2 10*3/uL (ref 4.0–10.5)

## 2018-01-11 LAB — AMYLASE: Amylase: 31 U/L (ref 27–131)

## 2018-01-11 LAB — LIPASE: LIPASE: 24 U/L (ref 11.0–59.0)

## 2018-01-15 DIAGNOSIS — M5417 Radiculopathy, lumbosacral region: Secondary | ICD-10-CM | POA: Diagnosis not present

## 2018-01-15 DIAGNOSIS — M9904 Segmental and somatic dysfunction of sacral region: Secondary | ICD-10-CM | POA: Diagnosis not present

## 2018-01-15 DIAGNOSIS — M545 Low back pain: Secondary | ICD-10-CM | POA: Diagnosis not present

## 2018-01-15 DIAGNOSIS — M9903 Segmental and somatic dysfunction of lumbar region: Secondary | ICD-10-CM | POA: Diagnosis not present

## 2018-01-16 DIAGNOSIS — M545 Low back pain: Secondary | ICD-10-CM | POA: Diagnosis not present

## 2018-01-16 DIAGNOSIS — M9904 Segmental and somatic dysfunction of sacral region: Secondary | ICD-10-CM | POA: Diagnosis not present

## 2018-01-16 DIAGNOSIS — M9903 Segmental and somatic dysfunction of lumbar region: Secondary | ICD-10-CM | POA: Diagnosis not present

## 2018-01-16 DIAGNOSIS — M5417 Radiculopathy, lumbosacral region: Secondary | ICD-10-CM | POA: Diagnosis not present

## 2018-01-18 DIAGNOSIS — M5417 Radiculopathy, lumbosacral region: Secondary | ICD-10-CM | POA: Diagnosis not present

## 2018-01-18 DIAGNOSIS — M545 Low back pain: Secondary | ICD-10-CM | POA: Diagnosis not present

## 2018-01-18 DIAGNOSIS — M9903 Segmental and somatic dysfunction of lumbar region: Secondary | ICD-10-CM | POA: Diagnosis not present

## 2018-01-18 DIAGNOSIS — M9904 Segmental and somatic dysfunction of sacral region: Secondary | ICD-10-CM | POA: Diagnosis not present

## 2018-01-21 DIAGNOSIS — M5417 Radiculopathy, lumbosacral region: Secondary | ICD-10-CM | POA: Diagnosis not present

## 2018-01-21 DIAGNOSIS — M9904 Segmental and somatic dysfunction of sacral region: Secondary | ICD-10-CM | POA: Diagnosis not present

## 2018-01-21 DIAGNOSIS — M545 Low back pain: Secondary | ICD-10-CM | POA: Diagnosis not present

## 2018-01-21 DIAGNOSIS — M9903 Segmental and somatic dysfunction of lumbar region: Secondary | ICD-10-CM | POA: Diagnosis not present

## 2018-01-22 DIAGNOSIS — M9903 Segmental and somatic dysfunction of lumbar region: Secondary | ICD-10-CM | POA: Diagnosis not present

## 2018-01-22 DIAGNOSIS — M9904 Segmental and somatic dysfunction of sacral region: Secondary | ICD-10-CM | POA: Diagnosis not present

## 2018-01-22 DIAGNOSIS — M545 Low back pain: Secondary | ICD-10-CM | POA: Diagnosis not present

## 2018-01-22 DIAGNOSIS — M5417 Radiculopathy, lumbosacral region: Secondary | ICD-10-CM | POA: Diagnosis not present

## 2018-01-23 DIAGNOSIS — M9903 Segmental and somatic dysfunction of lumbar region: Secondary | ICD-10-CM | POA: Diagnosis not present

## 2018-01-23 DIAGNOSIS — M9904 Segmental and somatic dysfunction of sacral region: Secondary | ICD-10-CM | POA: Diagnosis not present

## 2018-01-23 DIAGNOSIS — M5417 Radiculopathy, lumbosacral region: Secondary | ICD-10-CM | POA: Diagnosis not present

## 2018-01-23 DIAGNOSIS — M545 Low back pain: Secondary | ICD-10-CM | POA: Diagnosis not present

## 2018-01-28 DIAGNOSIS — M545 Low back pain: Secondary | ICD-10-CM | POA: Diagnosis not present

## 2018-01-28 DIAGNOSIS — M5417 Radiculopathy, lumbosacral region: Secondary | ICD-10-CM | POA: Diagnosis not present

## 2018-01-28 DIAGNOSIS — M9904 Segmental and somatic dysfunction of sacral region: Secondary | ICD-10-CM | POA: Diagnosis not present

## 2018-01-28 DIAGNOSIS — M9903 Segmental and somatic dysfunction of lumbar region: Secondary | ICD-10-CM | POA: Diagnosis not present

## 2018-01-29 DIAGNOSIS — M5417 Radiculopathy, lumbosacral region: Secondary | ICD-10-CM | POA: Diagnosis not present

## 2018-01-29 DIAGNOSIS — M9903 Segmental and somatic dysfunction of lumbar region: Secondary | ICD-10-CM | POA: Diagnosis not present

## 2018-01-29 DIAGNOSIS — M545 Low back pain: Secondary | ICD-10-CM | POA: Diagnosis not present

## 2018-01-29 DIAGNOSIS — M9904 Segmental and somatic dysfunction of sacral region: Secondary | ICD-10-CM | POA: Diagnosis not present

## 2018-01-30 DIAGNOSIS — M9903 Segmental and somatic dysfunction of lumbar region: Secondary | ICD-10-CM | POA: Diagnosis not present

## 2018-01-30 DIAGNOSIS — M9904 Segmental and somatic dysfunction of sacral region: Secondary | ICD-10-CM | POA: Diagnosis not present

## 2018-01-30 DIAGNOSIS — M545 Low back pain: Secondary | ICD-10-CM | POA: Diagnosis not present

## 2018-01-30 DIAGNOSIS — M5417 Radiculopathy, lumbosacral region: Secondary | ICD-10-CM | POA: Diagnosis not present

## 2018-02-04 DIAGNOSIS — M545 Low back pain: Secondary | ICD-10-CM | POA: Diagnosis not present

## 2018-02-04 DIAGNOSIS — M5417 Radiculopathy, lumbosacral region: Secondary | ICD-10-CM | POA: Diagnosis not present

## 2018-02-04 DIAGNOSIS — M9903 Segmental and somatic dysfunction of lumbar region: Secondary | ICD-10-CM | POA: Diagnosis not present

## 2018-02-04 DIAGNOSIS — M9904 Segmental and somatic dysfunction of sacral region: Secondary | ICD-10-CM | POA: Diagnosis not present

## 2018-02-06 DIAGNOSIS — M9904 Segmental and somatic dysfunction of sacral region: Secondary | ICD-10-CM | POA: Diagnosis not present

## 2018-02-06 DIAGNOSIS — M9903 Segmental and somatic dysfunction of lumbar region: Secondary | ICD-10-CM | POA: Diagnosis not present

## 2018-02-06 DIAGNOSIS — M5417 Radiculopathy, lumbosacral region: Secondary | ICD-10-CM | POA: Diagnosis not present

## 2018-02-06 DIAGNOSIS — M545 Low back pain: Secondary | ICD-10-CM | POA: Diagnosis not present

## 2018-03-05 DIAGNOSIS — M545 Low back pain: Secondary | ICD-10-CM | POA: Diagnosis not present

## 2018-03-05 DIAGNOSIS — M9904 Segmental and somatic dysfunction of sacral region: Secondary | ICD-10-CM | POA: Diagnosis not present

## 2018-03-05 DIAGNOSIS — M5417 Radiculopathy, lumbosacral region: Secondary | ICD-10-CM | POA: Diagnosis not present

## 2018-03-05 DIAGNOSIS — M9903 Segmental and somatic dysfunction of lumbar region: Secondary | ICD-10-CM | POA: Diagnosis not present

## 2018-03-14 ENCOUNTER — Encounter: Payer: Self-pay | Admitting: Family Medicine

## 2018-03-14 DIAGNOSIS — F411 Generalized anxiety disorder: Secondary | ICD-10-CM

## 2018-03-14 MED ORDER — LORAZEPAM 0.5 MG PO TABS: 0.5000 mg | ORAL_TABLET | Freq: Three times a day (TID) | ORAL | 1 refills | Status: DC | PRN

## 2018-03-14 NOTE — Telephone Encounter (Signed)
Pt is requesting refill on lorazepam.   Last OV: 01/10/2018 Last Fill: 08/10/2017 #90 and 1RF UDS: 08/10/2017 Low risk

## 2018-03-29 ENCOUNTER — Other Ambulatory Visit: Payer: Self-pay

## 2018-04-02 DIAGNOSIS — M545 Low back pain: Secondary | ICD-10-CM | POA: Diagnosis not present

## 2018-04-02 DIAGNOSIS — M9904 Segmental and somatic dysfunction of sacral region: Secondary | ICD-10-CM | POA: Diagnosis not present

## 2018-04-02 DIAGNOSIS — M5417 Radiculopathy, lumbosacral region: Secondary | ICD-10-CM | POA: Diagnosis not present

## 2018-04-02 DIAGNOSIS — M9903 Segmental and somatic dysfunction of lumbar region: Secondary | ICD-10-CM | POA: Diagnosis not present

## 2018-05-28 ENCOUNTER — Encounter: Payer: Self-pay | Admitting: Family Medicine

## 2018-05-28 ENCOUNTER — Ambulatory Visit: Payer: Medicare Other | Admitting: Family Medicine

## 2018-05-28 VITALS — BP 126/80 | HR 59 | Temp 98.5°F | Resp 16 | Ht 67.0 in | Wt 246.0 lb

## 2018-05-28 DIAGNOSIS — E785 Hyperlipidemia, unspecified: Secondary | ICD-10-CM | POA: Diagnosis not present

## 2018-05-28 DIAGNOSIS — Z23 Encounter for immunization: Secondary | ICD-10-CM

## 2018-05-28 DIAGNOSIS — F411 Generalized anxiety disorder: Secondary | ICD-10-CM | POA: Diagnosis not present

## 2018-05-28 LAB — COMPREHENSIVE METABOLIC PANEL
ALBUMIN: 4.3 g/dL (ref 3.5–5.2)
ALK PHOS: 73 U/L (ref 39–117)
ALT: 20 U/L (ref 0–35)
AST: 14 U/L (ref 0–37)
BILIRUBIN TOTAL: 0.4 mg/dL (ref 0.2–1.2)
BUN: 17 mg/dL (ref 6–23)
CO2: 28 mEq/L (ref 19–32)
Calcium: 9.2 mg/dL (ref 8.4–10.5)
Chloride: 103 mEq/L (ref 96–112)
Creatinine, Ser: 0.79 mg/dL (ref 0.40–1.20)
GFR: 76.21 mL/min (ref >60.00)
Glucose, Bld: 105 mg/dL — ABNORMAL HIGH (ref 70–99)
Potassium: 4.5 mEq/L (ref 3.5–5.1)
SODIUM: 138 meq/L (ref 135–145)
TOTAL PROTEIN: 6.7 g/dL (ref 6.0–8.3)

## 2018-05-28 LAB — LIPID PANEL
CHOLESTEROL: 178 mg/dL (ref 0–200)
HDL: 56.2 mg/dL (ref >39.00)
LDL Cholesterol: 100 mg/dL — ABNORMAL HIGH (ref 0–99)
NONHDL: 121.38
Total CHOL/HDL Ratio: 3
Triglycerides: 105 mg/dL (ref 0.0–149.0)
VLDL: 21 mg/dL (ref 0.0–40.0)

## 2018-05-28 MED ORDER — LORAZEPAM 0.5 MG PO TABS: 0.5000 mg | ORAL_TABLET | Freq: Three times a day (TID) | ORAL | 1 refills | Status: DC | PRN

## 2018-05-28 MED ORDER — SERTRALINE HCL 100 MG PO TABS: 150.0000 mg | ORAL_TABLET | Freq: Every day | ORAL | 1 refills | Status: DC

## 2018-05-28 NOTE — Assessment & Plan Note (Signed)
Encouraged heart healthy diet, increase exercise, avoid trans fats, consider a krill oil cap daily 

## 2018-05-28 NOTE — Progress Notes (Signed)
Patient ID: Allison Cox, female    DOB: 10/14/1945  Age: 73 y.o. MRN: [id number]    Subjective:  Subjective  HPI Allison Cox presents for f/u cholesterol and anxiety.  No complaints    Review of Systems  Constitutional: Negative for appetite change, diaphoresis, fatigue and unexpected weight change.  Eyes: Negative for pain, redness and visual disturbance.  Respiratory: Negative for cough, chest tightness, shortness of breath and wheezing.   Cardiovascular: Negative for chest pain, palpitations and leg swelling.  Endocrine: Negative for cold intolerance, heat intolerance, polydipsia, polyphagia and polyuria.  Genitourinary: Negative for difficulty urinating, dysuria and frequency.  Neurological: Negative for dizziness, light-headedness, numbness and headaches.    History Past Medical History:  Diagnosis Date  . Anxiety   . Arthritis    knee  . Colon polyps    hyperplastic and Adenomatous  . Hyperlipidemia   . Osteopenia     She has a past surgical history that includes Carpal tunnel release (Left); Knee arthroscopy (Right); Colonoscopy (07/2012); and Wisdom tooth extraction.   Her family history includes Coronary artery disease in her unknown relative; Heart attack in her father; Stroke in her mother.She reports that she quit smoking about 32 years ago. Her smoking use included cigarettes. She has never used smokeless tobacco. She reports current alcohol use of about 3.0 - 4.0 standard drinks of alcohol per week. She reports that she does not use drugs.  Current Outpatient Medications on File Prior to Visit  Medication Sig Dispense Refill  . atorvastatin (LIPITOR) 20 MG tablet Take 1 tablet (20 mg total) by mouth daily. 90 tablet 1   Current Facility-Administered Medications on File Prior to Visit  Medication Dose Route Frequency Provider Last Rate Last Dose  . 0.9 %  sodium chloride infusion  500 mL Intravenous Once Irene Shipper, MD         Objective:    Objective  Physical Exam Vitals signs and nursing note reviewed.  Constitutional:      Appearance: She is well-developed.  HENT:     Head: Normocephalic and atraumatic.  Eyes:     Conjunctiva/sclera: Conjunctivae normal.  Neck:     Musculoskeletal: Normal range of motion and neck supple.     Thyroid: No thyromegaly.     Vascular: No carotid bruit or JVD.  Cardiovascular:     Rate and Rhythm: Normal rate and regular rhythm.     Heart sounds: Normal heart sounds. No murmur.  Pulmonary:     Effort: Pulmonary effort is normal. No respiratory distress.     Breath sounds: Normal breath sounds. No wheezing or rales.  Chest:     Chest wall: No tenderness.  Neurological:     Mental Status: She is alert and oriented to person, place, and time.    BP 126/80 (BP Location: Left Arm, Patient Position: Sitting, Cuff Size: Large)   Pulse (!) 59   Temp 98.5 F (36.9 C) (Oral)   Resp 16   Ht 5\' 7"  (1.702 m)   Wt 246 lb (111.6 kg)   SpO2 97%   BMI 38.53 kg/m  Wt Readings from Last 3 Encounters:  05/28/18 246 lb (111.6 kg)  01/10/18 243 lb (110.2 kg)  12/04/17 239 lb (108.4 kg)     Lab Results  Component Value Date   WBC 5.2 01/10/2018   HGB 13.0 01/10/2018   HCT 38.7 01/10/2018   PLT 221.0 01/10/2018   GLUCOSE 105 (H) 05/28/2018   CHOL 178 05/28/2018  TRIG 105.0 05/28/2018   HDL 56.20 05/28/2018   LDLDIRECT 125.1 10/06/2011   LDLCALC 100 (H) 05/28/2018   ALT 20 05/28/2018   AST 14 05/28/2018   NA 138 05/28/2018   K 4.5 05/28/2018   CL 103 05/28/2018   CREATININE 0.79 05/28/2018   BUN 17 05/28/2018   CO2 28 05/28/2018   TSH 2.68 08/11/2009    US Abdomen Limited Ruq  Result Date: 01/11/2018 CLINICAL DATA:  Right upper quadrant abdominal pain. EXAM: ULTRASOUND ABDOMEN LIMITED RIGHT UPPER QUADRANT COMPARISON:  CT scan of February 19, 2014. FINDINGS: Gallbladder: No gallstones or wall thickening visualized. No sonographic Murphy sign noted by sonographer. Common bile duct:  Diameter: 3.7 mm which is within normal limits. Liver: No focal lesion identified. Within normal limits in parenchymal echogenicity. Portal vein is patent on color Doppler imaging with normal direction of blood flow towards the liver. IMPRESSION: No definite abnormality seen in the right upper quadrant of the abdomen. Electronically Signed   By: Marijo Conception, M.D.   On: 01/11/2018 14:08     Assessment & Plan:  Plan  I am having Allison Fail "Gay" maintain her atorvastatin, sertraline, and LORazepam. We will continue to administer sodium chloride.  Meds ordered this encounter  Medications  . sertraline (ZOLOFT) 100 MG tablet    Sig: Take 1.5 tablets (150 mg total) by mouth daily.    Dispense:  135 tablet    Refill:  1  . LORazepam (ATIVAN) 0.5 MG tablet    Sig: Take 1 tablet (0.5 mg total) by mouth every 8 (eight) hours as needed for anxiety.    Dispense:  90 tablet    Refill:  1    Problem List Items Addressed This Visit      Unprioritized   Anxiety state    Stable  con't meds      Relevant Medications   sertraline (ZOLOFT) 100 MG tablet   LORazepam (ATIVAN) 0.5 MG tablet   Generalized anxiety disorder   Relevant Medications   sertraline (ZOLOFT) 100 MG tablet   LORazepam (ATIVAN) 0.5 MG tablet   Hyperlipidemia - Primary    Encouraged heart healthy diet, increase exercise, avoid trans fats, consider a krill oil cap daily      Relevant Orders   Lipid panel (Completed)   Comprehensive metabolic panel (Completed)    Other Visit Diagnoses    Need for influenza vaccination       Relevant Orders   Flu vaccine HIGH DOSE PF (Fluzone High dose) (Completed)      Follow-up: Return in about 6 months (around 11/26/2018) for annual exam, fasting.  [name], DO

## 2018-05-28 NOTE — Assessment & Plan Note (Signed)
Stable con't meds 

## 2018-05-28 NOTE — Patient Instructions (Signed)

## 2018-06-08 ENCOUNTER — Other Ambulatory Visit: Payer: Self-pay | Admitting: Family Medicine

## 2018-06-08 DIAGNOSIS — E785 Hyperlipidemia, unspecified: Secondary | ICD-10-CM

## 2018-12-03 ENCOUNTER — Other Ambulatory Visit: Payer: Self-pay | Admitting: Family Medicine

## 2018-12-03 DIAGNOSIS — F411 Generalized anxiety disorder: Secondary | ICD-10-CM

## 2018-12-03 NOTE — Telephone Encounter (Signed)
Requesting: Ativan Contract: 02/19/2014 UDS: 08/10/2017, low risk Last OV: 05/28/2018 Next OV: N/A Last Refill: 05/28/2018, #90-1 RF Database:   Please advise

## 2018-12-09 ENCOUNTER — Encounter: Payer: Self-pay | Admitting: Family Medicine

## 2018-12-09 DIAGNOSIS — E785 Hyperlipidemia, unspecified: Secondary | ICD-10-CM

## 2018-12-09 DIAGNOSIS — F411 Generalized anxiety disorder: Secondary | ICD-10-CM

## 2018-12-09 MED ORDER — ATORVASTATIN CALCIUM 20 MG PO TABS: 20.0000 mg | ORAL_TABLET | Freq: Every day | ORAL | 0 refills | Status: DC

## 2018-12-09 MED ORDER — SERTRALINE HCL 100 MG PO TABS: 150.0000 mg | ORAL_TABLET | Freq: Every day | ORAL | 0 refills | Status: DC

## 2018-12-10 ENCOUNTER — Other Ambulatory Visit: Payer: Self-pay

## 2018-12-13 ENCOUNTER — Encounter: Payer: Self-pay | Admitting: Family Medicine

## 2018-12-13 ENCOUNTER — Other Ambulatory Visit: Payer: Self-pay

## 2018-12-13 ENCOUNTER — Ambulatory Visit (INDEPENDENT_AMBULATORY_CARE_PROVIDER_SITE_OTHER): Payer: Medicare Other | Admitting: Family Medicine

## 2018-12-13 DIAGNOSIS — E782 Mixed hyperlipidemia: Secondary | ICD-10-CM | POA: Diagnosis not present

## 2018-12-13 DIAGNOSIS — F411 Generalized anxiety disorder: Secondary | ICD-10-CM

## 2018-12-13 DIAGNOSIS — E785 Hyperlipidemia, unspecified: Secondary | ICD-10-CM | POA: Diagnosis not present

## 2018-12-13 DIAGNOSIS — I10 Essential (primary) hypertension: Secondary | ICD-10-CM | POA: Diagnosis not present

## 2018-12-13 LAB — LIPID PANEL
Cholesterol: 176 mg/dL (ref 0–200)
HDL: 63.8 mg/dL (ref >39.00)
LDL Cholesterol: 88 mg/dL (ref 0–99)
NonHDL: 112.36
Total CHOL/HDL Ratio: 3
Triglycerides: 122 mg/dL (ref 0.0–149.0)
VLDL: 24.4 mg/dL (ref 0.0–40.0)

## 2018-12-13 LAB — COMPREHENSIVE METABOLIC PANEL
ALT: 16 U/L (ref 0–35)
AST: 13 U/L (ref 0–37)
Albumin: 4.3 g/dL (ref 3.5–5.2)
Alkaline Phosphatase: 64 U/L (ref 39–117)
BUN: 16 mg/dL (ref 6–23)
CO2: 28 mEq/L (ref 19–32)
Calcium: 9.1 mg/dL (ref 8.4–10.5)
Chloride: 104 mEq/L (ref 96–112)
Creatinine, Ser: 0.75 mg/dL (ref 0.40–1.20)
GFR: 76.01 mL/min (ref >60.00)
Glucose, Bld: 102 mg/dL — ABNORMAL HIGH (ref 70–99)
Potassium: 4.5 mEq/L (ref 3.5–5.1)
Sodium: 139 mEq/L (ref 135–145)
Total Bilirubin: 0.5 mg/dL (ref 0.2–1.2)
Total Protein: 6.7 g/dL (ref 6.0–8.3)

## 2018-12-13 MED ORDER — ATORVASTATIN CALCIUM 20 MG PO TABS: 20.0000 mg | ORAL_TABLET | Freq: Every day | ORAL | 0 refills | Status: DC

## 2018-12-13 NOTE — Assessment & Plan Note (Signed)
Stable con't meds 

## 2018-12-13 NOTE — Progress Notes (Signed)
Patient ID: Allison Cox, female    DOB: 1945-07-13  Age: 73 y.o. MRN: [id number]    Subjective:  Subjective  HPI Allison Cox presents for f/u chol and anxiety.   Pt has no complaints.    Review of Systems  Constitutional: Negative for appetite change, diaphoresis, fatigue and unexpected weight change.  Eyes: Negative for pain, redness and visual disturbance.  Respiratory: Negative for cough, chest tightness, shortness of breath and wheezing.   Cardiovascular: Negative for chest pain, palpitations and leg swelling.  Endocrine: Negative for cold intolerance, heat intolerance, polydipsia, polyphagia and polyuria.  Genitourinary: Negative for difficulty urinating, dysuria and frequency.  Neurological: Negative for dizziness, light-headedness, numbness and headaches.    History Past Medical History:  Diagnosis Date  . Anxiety   . Arthritis    knee  . Colon polyps    hyperplastic and Adenomatous  . Hyperlipidemia   . Osteopenia     She has a past surgical history that includes Carpal tunnel release (Left); Knee arthroscopy (Right); Colonoscopy (07/2012); and Wisdom tooth extraction.   Her family history includes Coronary artery disease in her unknown relative; Heart attack in her father; Stroke in her mother.She reports that she quit smoking about 32 years ago. Her smoking use included cigarettes. She has never used smokeless tobacco. She reports current alcohol use of about 3.0 - 4.0 standard drinks of alcohol per week. She reports that she does not use drugs.  Current Outpatient Medications on File Prior to Visit  Medication Sig Dispense Refill  . LORazepam (ATIVAN) 0.5 MG tablet TAKE 1 TABLET (0.5 MG TOTAL) BY MOUTH EVERY 8 (EIGHT) HOURS AS NEEDED FOR ANXIETY. 90 tablet 0  . sertraline (ZOLOFT) 100 MG tablet Take 1.5 tablets (150 mg total) by mouth daily. 90 tablet 0   Current Facility-Administered Medications on File Prior to Visit  Medication Dose Route Frequency  Provider Last Rate Last Dose  . 0.9 %  sodium chloride infusion  500 mL Intravenous Once Irene Shipper, MD         Objective:  Objective  Physical Exam Vitals signs and nursing note reviewed.  Constitutional:      Appearance: She is well-developed.  HENT:     Head: Normocephalic and atraumatic.  Eyes:     Conjunctiva/sclera: Conjunctivae normal.  Neck:     Musculoskeletal: Normal range of motion and neck supple.     Thyroid: No thyromegaly.     Vascular: No carotid bruit or JVD.  Cardiovascular:     Rate and Rhythm: Normal rate and regular rhythm.     Heart sounds: Normal heart sounds. No murmur.  Pulmonary:     Effort: Pulmonary effort is normal. No respiratory distress.     Breath sounds: Normal breath sounds. No wheezing or rales.  Chest:     Chest wall: No tenderness.  Neurological:     Mental Status: She is alert and oriented to person, place, and time.    BP 114/65 (BP Location: Left Arm, Patient Position: Sitting, Cuff Size: Normal)   Pulse 66   Temp 98.2 F (36.8 C) (Oral)   Resp 18   Ht 5\' 7"  (1.702 m)   Wt 245 lb (111.1 kg)   SpO2 98%   BMI 38.37 kg/m  Wt Readings from Last 3 Encounters:  12/13/18 245 lb (111.1 kg)  05/28/18 246 lb (111.6 kg)  01/10/18 243 lb (110.2 kg)     Lab Results  Component Value Date   WBC 5.2  01/10/2018   HGB 13.0 01/10/2018   HCT 38.7 01/10/2018   PLT 221.0 01/10/2018   GLUCOSE 105 (H) 05/28/2018   CHOL 178 05/28/2018   TRIG 105.0 05/28/2018   HDL 56.20 05/28/2018   LDLDIRECT 125.1 10/06/2011   LDLCALC 100 (H) 05/28/2018   ALT 20 05/28/2018   AST 14 05/28/2018   NA 138 05/28/2018   K 4.5 05/28/2018   CL 103 05/28/2018   CREATININE 0.79 05/28/2018   BUN 17 05/28/2018   CO2 28 05/28/2018   TSH 2.68 08/11/2009    US Abdomen Limited Ruq  Result Date: 01/11/2018 CLINICAL DATA:  Right upper quadrant abdominal pain. EXAM: ULTRASOUND ABDOMEN LIMITED RIGHT UPPER QUADRANT COMPARISON:  CT scan of February 19, 2014.  FINDINGS: Gallbladder: No gallstones or wall thickening visualized. No sonographic Murphy sign noted by sonographer. Common bile duct: Diameter: 3.7 mm which is within normal limits. Liver: No focal lesion identified. Within normal limits in parenchymal echogenicity. Portal vein is patent on color Doppler imaging with normal direction of blood flow towards the liver. IMPRESSION: No definite abnormality seen in the right upper quadrant of the abdomen. Electronically Signed   By: Marijo Conception, M.D.   On: 01/11/2018 14:08     Assessment & Plan:  Plan  I am having Allison Cox "Gay" maintain her LORazepam, sertraline, and atorvastatin. We will continue to administer sodium chloride.  Meds ordered this encounter  Medications  . atorvastatin (LIPITOR) 20 MG tablet    Sig: Take 1 tablet (20 mg total) by mouth daily.    Dispense:  30 tablet    Refill:  0    Problem List Items Addressed This Visit      Unprioritized   Anxiety state    Stable  con't meds       Essential hypertension    Well controlled, no changes to meds. Encouraged heart healthy diet such as the DASH diet and exercise as tolerated.       Relevant Medications   atorvastatin (LIPITOR) 20 MG tablet   Hyperlipidemia    Tolerating statin, encouraged heart healthy diet, avoid trans fats, minimize simple carbs and saturated fats. Increase exercise as tolerated      Relevant Medications   atorvastatin (LIPITOR) 20 MG tablet   Other Relevant Orders   Lipid panel   Comprehensive metabolic panel      Follow-up: Return in about 6 months (around 06/15/2019), or if symptoms worsen or Cox to improve, for annual exam, fasting.  [name], DO

## 2018-12-13 NOTE — Assessment & Plan Note (Signed)
Well controlled, no changes to meds. Encouraged heart healthy diet such as the DASH diet and exercise as tolerated.  °

## 2018-12-13 NOTE — Assessment & Plan Note (Signed)
Tolerating statin, encouraged heart healthy diet, avoid trans fats, minimize simple carbs and saturated fats. Increase exercise as tolerated 

## 2018-12-13 NOTE — Patient Instructions (Signed)

## 2019-01-31 ENCOUNTER — Other Ambulatory Visit: Payer: Self-pay | Admitting: Family Medicine

## 2019-01-31 DIAGNOSIS — F411 Generalized anxiety disorder: Secondary | ICD-10-CM

## 2019-02-28 ENCOUNTER — Other Ambulatory Visit: Payer: Self-pay | Admitting: Family Medicine

## 2019-02-28 DIAGNOSIS — F411 Generalized anxiety disorder: Secondary | ICD-10-CM

## 2019-02-28 NOTE — Telephone Encounter (Signed)
Requesting: Ativan Contract: 02/19/2014 UDS: 08/10/2017, low risk Last OV: 12/13/2018 Next OV: N/A Last Refill: 12/03/2018, #90--0 RF Database:   Please advise

## 2019-03-04 ENCOUNTER — Other Ambulatory Visit: Payer: Self-pay | Admitting: *Deleted

## 2019-03-04 DIAGNOSIS — E782 Mixed hyperlipidemia: Secondary | ICD-10-CM

## 2019-03-04 DIAGNOSIS — F411 Generalized anxiety disorder: Secondary | ICD-10-CM

## 2019-03-04 MED ORDER — SERTRALINE HCL 100 MG PO TABS: ORAL_TABLET | ORAL | 0 refills | Status: DC

## 2019-03-04 MED ORDER — ATORVASTATIN CALCIUM 20 MG PO TABS: 20.0000 mg | ORAL_TABLET | Freq: Every day | ORAL | 0 refills | Status: DC

## 2019-03-04 NOTE — Addendum Note (Signed)
Addended by: Kem Boroughs D on: 03/04/2019 12:34 PM   Modules accepted: Orders

## 2019-05-26 ENCOUNTER — Other Ambulatory Visit: Payer: Self-pay | Admitting: Family Medicine

## 2019-05-26 DIAGNOSIS — F411 Generalized anxiety disorder: Secondary | ICD-10-CM

## 2019-05-27 ENCOUNTER — Other Ambulatory Visit: Payer: Self-pay | Admitting: Family Medicine

## 2019-05-27 DIAGNOSIS — E782 Mixed hyperlipidemia: Secondary | ICD-10-CM

## 2019-05-29 ENCOUNTER — Encounter: Payer: Self-pay | Admitting: Family Medicine

## 2019-05-29 ENCOUNTER — Other Ambulatory Visit: Payer: Self-pay

## 2019-05-29 ENCOUNTER — Ambulatory Visit: Payer: Medicare Other | Admitting: Family Medicine

## 2019-05-29 DIAGNOSIS — E782 Mixed hyperlipidemia: Secondary | ICD-10-CM

## 2019-05-29 DIAGNOSIS — F411 Generalized anxiety disorder: Secondary | ICD-10-CM | POA: Diagnosis not present

## 2019-05-29 LAB — COMPREHENSIVE METABOLIC PANEL
ALT: 15 U/L (ref 0–35)
AST: 14 U/L (ref 0–37)
Albumin: 4.6 g/dL (ref 3.5–5.2)
Alkaline Phosphatase: 77 U/L (ref 39–117)
BUN: 22 mg/dL (ref 6–23)
CO2: 30 mEq/L (ref 19–32)
Calcium: 9.6 mg/dL (ref 8.4–10.5)
Chloride: 101 mEq/L (ref 96–112)
Creatinine, Ser: 0.83 mg/dL (ref 0.40–1.20)
GFR: 67.54 mL/min (ref >60.00)
Glucose, Bld: 112 mg/dL — ABNORMAL HIGH (ref 70–99)
Potassium: 4.5 mEq/L (ref 3.5–5.1)
Sodium: 137 mEq/L (ref 135–145)
Total Bilirubin: 0.5 mg/dL (ref 0.2–1.2)
Total Protein: 7.5 g/dL (ref 6.0–8.3)

## 2019-05-29 LAB — LIPID PANEL
Cholesterol: 188 mg/dL (ref 0–200)
HDL: 64.7 mg/dL (ref >39.00)
LDL Cholesterol: 103 mg/dL — ABNORMAL HIGH (ref 0–99)
NonHDL: 123.18
Total CHOL/HDL Ratio: 3
Triglycerides: 101 mg/dL (ref 0.0–149.0)
VLDL: 20.2 mg/dL (ref 0.0–40.0)

## 2019-05-29 MED ORDER — SERTRALINE HCL 100 MG PO TABS: ORAL_TABLET | ORAL | 3 refills | Status: DC

## 2019-05-29 MED ORDER — ATORVASTATIN CALCIUM 20 MG PO TABS: 20.0000 mg | ORAL_TABLET | Freq: Every day | ORAL | 3 refills | Status: DC

## 2019-05-29 MED ORDER — LORAZEPAM 0.5 MG PO TABS: 0.5000 mg | ORAL_TABLET | Freq: Three times a day (TID) | ORAL | 0 refills | Status: DC | PRN

## 2019-05-29 NOTE — Patient Instructions (Addendum)
COVID-19 Vaccine Information can be found at: [email or web address] For questions related to vaccine distribution or appointments, please email [email or web address] or call [phone].   High Cholesterol  High cholesterol is a condition in which the blood has high levels of a white, waxy, fat-like substance (cholesterol). The human body needs small amounts of cholesterol. The liver makes all the cholesterol that the body needs. Extra (excess) cholesterol comes from the food that we eat. Cholesterol is carried from the liver by the blood through the blood vessels. If you have high cholesterol, deposits (plaques) may build up on the walls of your blood vessels (arteries). Plaques make the arteries narrower and stiffer. Cholesterol plaques increase your risk for heart attack and stroke. Work with your health care provider to keep your cholesterol levels in a healthy range. What increases the risk? This condition is more likely to develop in people who:  Eat foods that are high in animal fat (saturated fat) or cholesterol.  Are overweight.  Are not getting enough exercise.  Have a family history of high cholesterol. What are the signs or symptoms? There are no symptoms of this condition. How is this diagnosed? This condition may be diagnosed from the results of a blood test.  If you are older than age 6, your health care provider may check your cholesterol every 4-6 years.  You may be checked more often if you already have high cholesterol or other risk factors for heart disease. The blood test for cholesterol measures:  "Bad" cholesterol (LDL cholesterol). This is the main type of cholesterol that causes heart disease. The desired level for LDL is less than 100.  "Good" cholesterol (HDL cholesterol). This type helps to protect against heart disease by cleaning the arteries and carrying the LDL away. The desired level for HDL is 60  or higher.  Triglycerides. These are fats that the body can store or burn for energy. The desired number for triglycerides is lower than 150.  Total cholesterol. This is a measure of the total amount of cholesterol in your blood, including LDL cholesterol, HDL cholesterol, and triglycerides. A healthy number is less than 200. How is this treated? This condition is treated with diet changes, lifestyle changes, and medicines. Diet changes  This may include eating more whole grains, fruits, vegetables, nuts, and fish.  This may also include cutting back on red meat and foods that have a lot of added sugar. Lifestyle changes  Changes may include getting at least 40 minutes of aerobic exercise 3 times a week. Aerobic exercises include walking, biking, and swimming. Aerobic exercise along with a healthy diet can help you maintain a healthy weight.  Changes may also include quitting smoking. Medicines  Medicines are usually given if diet and lifestyle changes have failed to reduce your cholesterol to healthy levels.  Your health care provider may prescribe a statin medicine. Statin medicines have been shown to reduce cholesterol, which can reduce the risk of heart disease. Follow these instructions at home: Eating and drinking If told by your health care provider:  Eat chicken (without skin), fish, veal, shellfish, ground [place] breast, and round or loin cuts of red meat.  Do not eat fried foods or fatty meats, such as hot dogs and salami.  Eat plenty of fruits, such as apples.  Eat plenty of vegetables, such as broccoli, potatoes, and carrots.  Eat beans, peas, and lentils.  Eat grains such as barley, rice, couscous, and bulgur wheat.  Eat pasta without cream sauces.  Use skim  or nonfat milk, and eat low-fat or nonfat yogurt and cheeses.  Do not eat or drink whole milk, cream, ice cream, egg yolks, or hard cheeses.  Do not eat stick margarine or tub margarines that contain trans  fats (also called partially hydrogenated oils).  Do not eat saturated tropical oils, such as coconut oil and palm oil.  Do not eat cakes, cookies, crackers, or other baked goods that contain trans fats.  General instructions  Exercise as directed by your health care provider. Increase your activity level with activities such as gardening, walking, and taking the stairs.  Take over-the-counter and prescription medicines only as told by your health care provider.  Do not use any products that contain nicotine or tobacco, such as cigarettes and e-cigarettes. If you need help quitting, ask your health care provider.  Keep all follow-up visits as told by your health care provider. This is important. Contact a health care provider if:  You are struggling to maintain a healthy diet or weight.  You need help to start on an exercise program.  You need help to stop smoking. Get help right away if:  You have chest pain.  You have trouble breathing. This information is not intended to replace advice given to you by your health care provider. Make sure you discuss any questions you have with your health care provider. Document Revised: 05/04/2017 Document Reviewed: 10/30/2015 Elsevier Patient Education  [place].

## 2019-05-29 NOTE — Assessment & Plan Note (Signed)
Tolerating statin, encouraged heart healthy diet, avoid trans fats, minimize simple carbs and saturated fats. Increase exercise as tolerated 

## 2019-05-29 NOTE — Progress Notes (Signed)
Patient ID: Allison Cox, female    DOB: 1946-04-30  Age: 74 y.o. MRN: [id number]    Subjective:  Subjective  HPI Cassidie Mountain presents for f/u cholesterol and anxiety  Review of Systems  Constitutional: Negative for appetite change, diaphoresis, fatigue and unexpected weight change.  Eyes: Negative for pain, redness and visual disturbance.  Respiratory: Negative for cough, chest tightness, shortness of breath and wheezing.   Cardiovascular: Negative for chest pain, palpitations and leg swelling.  Endocrine: Negative for cold intolerance, heat intolerance, polydipsia, polyphagia and polyuria.  Genitourinary: Negative for difficulty urinating, dysuria and frequency.  Neurological: Negative for dizziness, light-headedness, numbness and headaches.    History Past Medical History:  Diagnosis Date  . Anxiety   . Arthritis    knee  . Colon polyps    hyperplastic and Adenomatous  . Hyperlipidemia   . Osteopenia     She has a past surgical history that includes Carpal tunnel release (Left); Knee arthroscopy (Right); Colonoscopy (07/2012); and Wisdom tooth extraction.   Her family history includes Coronary artery disease in her unknown relative; Heart attack in her father; Stroke in her mother.She reports that she quit smoking about 33 years ago. Her smoking use included cigarettes. She has never used smokeless tobacco. She reports current alcohol use of about 3.0 - 4.0 standard drinks of alcohol per week. She reports that she does not use drugs.  No current outpatient medications on file prior to visit.   Current Facility-Administered Medications on File Prior to Visit  Medication Dose Route Frequency Provider Last Rate Last Admin  . 0.9 %  sodium chloride infusion  500 mL Intravenous Once Irene Shipper, MD         Objective:  Objective  Physical Exam Vitals and nursing note reviewed.  Constitutional:      Appearance: She is well-developed.  HENT:     Head:  Normocephalic and atraumatic.  Eyes:     Conjunctiva/sclera: Conjunctivae normal.  Neck:     Thyroid: No thyromegaly.     Vascular: No carotid bruit or JVD.  Cardiovascular:     Rate and Rhythm: Normal rate and regular rhythm.     Heart sounds: Normal heart sounds. No murmur.  Pulmonary:     Effort: Pulmonary effort is normal. No respiratory distress.     Breath sounds: Normal breath sounds. No wheezing or rales.  Chest:     Chest wall: No tenderness.  Musculoskeletal:     Cervical back: Normal range of motion and neck supple.  Neurological:     Mental Status: She is alert and oriented to person, place, and time.    BP 112/78 (BP Location: Right Arm, Patient Position: Sitting, Cuff Size: Large)   Pulse 68   Temp (!) 97 F (36.1 C) (Temporal)   Resp 18   Ht 5\' 7"  (1.702 m)   Wt 242 lb 12.8 oz (110.1 kg)   SpO2 95%   BMI 38.03 kg/m  Wt Readings from Last 3 Encounters:  05/29/19 242 lb 12.8 oz (110.1 kg)  12/13/18 245 lb (111.1 kg)  05/28/18 246 lb (111.6 kg)     Lab Results  Component Value Date   WBC 5.2 01/10/2018   HGB 13.0 01/10/2018   HCT 38.7 01/10/2018   PLT 221.0 01/10/2018   GLUCOSE 112 (H) 05/29/2019   CHOL 188 05/29/2019   TRIG 101.0 05/29/2019   HDL 64.70 05/29/2019   LDLDIRECT 125.1 10/06/2011   LDLCALC 103 (H) 05/29/2019  ALT 15 05/29/2019   AST 14 05/29/2019   NA 137 05/29/2019   K 4.5 05/29/2019   CL 101 05/29/2019   CREATININE 0.83 05/29/2019   BUN 22 05/29/2019   CO2 30 05/29/2019   TSH 2.68 08/11/2009    US Abdomen Limited RUQ  Result Date: 01/11/2018 CLINICAL DATA:  Right upper quadrant abdominal pain. EXAM: ULTRASOUND ABDOMEN LIMITED RIGHT UPPER QUADRANT COMPARISON:  CT scan of February 19, 2014. FINDINGS: Gallbladder: No gallstones or wall thickening visualized. No sonographic Murphy sign noted by sonographer. Common bile duct: Diameter: 3.7 mm which is within normal limits. Liver: No focal lesion identified. Within normal limits in  parenchymal echogenicity. Portal vein is patent on color Doppler imaging with normal direction of blood flow towards the liver. IMPRESSION: No definite abnormality seen in the right upper quadrant of the abdomen. Electronically Signed   By: Marijo Conception, M.D.   On: 01/11/2018 14:08     Assessment & Plan:  Plan  I have changed Synetta Fail "Gay"'s atorvastatin. I am also having her maintain her LORazepam and sertraline. We will continue to administer sodium chloride.  Meds ordered this encounter  Medications  . atorvastatin (LIPITOR) 20 MG tablet    Sig: Take 1 tablet (20 mg total) by mouth daily.    Dispense:  90 tablet    Refill:  3  . LORazepam (ATIVAN) 0.5 MG tablet    Sig: Take 1 tablet (0.5 mg total) by mouth every 8 (eight) hours as needed for anxiety.    Dispense:  90 tablet    Refill:  0    Not to exceed 5 additional fills before 06/01/2019  . sertraline (ZOLOFT) 100 MG tablet    Sig: TAKE 1 AND 1/2 TABLETS BY MOUTH EVERY DAY    Dispense:  135 tablet    Refill:  3    Problem List Items Addressed This Visit      Unprioritized   Generalized anxiety disorder    Stable Refill meds       Relevant Medications   LORazepam (ATIVAN) 0.5 MG tablet   sertraline (ZOLOFT) 100 MG tablet   Hyperlipidemia    Tolerating statin, encouraged heart healthy diet, avoid trans fats, minimize simple carbs and saturated fats. Increase exercise as tolerated      Relevant Medications   atorvastatin (LIPITOR) 20 MG tablet   Other Relevant Orders   Lipid panel (Completed)   Comprehensive metabolic panel (Completed)      Follow-up: Return in about 6 months (around 11/26/2019), or if symptoms worsen or fail to improve, for annual exam, fasting.  [name], DO

## 2019-06-01 NOTE — Assessment & Plan Note (Signed)
Stable. Refill meds

## 2019-07-06 ENCOUNTER — Ambulatory Visit: Payer: Medicare Other | Attending: Internal Medicine

## 2019-07-06 DIAGNOSIS — Z23 Encounter for immunization: Secondary | ICD-10-CM | POA: Insufficient documentation

## 2019-07-06 NOTE — Progress Notes (Signed)
   Covid-19 Vaccination Clinic  Name:  Allison Cox    MRN: [id number] DOB: [birth year]  07/06/2019  [name] was observed post Covid-19 immunization for 15 minutes without incidence. She was provided with Vaccine Information Sheet and instruction to access the V-Safe system.   [name] was instructed to call 911 with any severe reactions post vaccine: Marland Kitchen Difficulty breathing  . Swelling of your face and throat  . A fast heartbeat  . A bad rash all over your body  . Dizziness and weakness    Immunizations Administered    Name Date Dose VIS Date Route   Pfizer COVID-19 Vaccine 07/06/2019  8:35 AM 0.3 mL 04/25/2019 Intramuscular   Manufacturer: [place]   Lot: [id number]   [place]: [id number]

## 2019-07-30 ENCOUNTER — Ambulatory Visit: Payer: Medicare Other | Attending: Internal Medicine

## 2019-07-30 DIAGNOSIS — Z23 Encounter for immunization: Secondary | ICD-10-CM

## 2019-07-30 NOTE — Progress Notes (Signed)
   Covid-19 Vaccination Clinic  Name:  Allison Cox    MRN: [id number] DOB: [birth year]  07/30/2019  [name] was observed post Covid-19 immunization for 15 minutes without incident. She was provided with Vaccine Information Sheet and instruction to access the V-Safe system.   [name] was instructed to call 911 with any severe reactions post vaccine: Marland Kitchen Difficulty breathing  . Swelling of face and throat  . A fast heartbeat  . A bad rash all over body  . Dizziness and weakness   Immunizations Administered    Name Date Dose VIS Date Route   Pfizer COVID-19 Vaccine 07/30/2019  9:23 AM 0.3 mL 04/25/2019 Intramuscular   Manufacturer: [place]   Lot: [id number]   [place]: [id number]

## 2019-08-22 DIAGNOSIS — H524 Presbyopia: Secondary | ICD-10-CM | POA: Diagnosis not present

## 2019-08-25 ENCOUNTER — Encounter: Payer: Self-pay | Admitting: Family Medicine

## 2019-08-25 DIAGNOSIS — F411 Generalized anxiety disorder: Secondary | ICD-10-CM

## 2019-08-25 DIAGNOSIS — E782 Mixed hyperlipidemia: Secondary | ICD-10-CM

## 2019-08-26 ENCOUNTER — Other Ambulatory Visit: Payer: Self-pay | Admitting: Family Medicine

## 2019-08-26 ENCOUNTER — Encounter: Payer: Self-pay | Admitting: Family Medicine

## 2019-08-26 DIAGNOSIS — F411 Generalized anxiety disorder: Secondary | ICD-10-CM

## 2019-08-26 DIAGNOSIS — E782 Mixed hyperlipidemia: Secondary | ICD-10-CM

## 2019-08-26 MED ORDER — LORAZEPAM 0.5 MG PO TABS: 0.5000 mg | ORAL_TABLET | Freq: Three times a day (TID) | ORAL | 0 refills | Status: DC | PRN

## 2019-08-26 MED ORDER — SERTRALINE HCL 100 MG PO TABS: ORAL_TABLET | ORAL | 3 refills | Status: DC

## 2019-08-26 MED ORDER — ATORVASTATIN CALCIUM 20 MG PO TABS: 20.0000 mg | ORAL_TABLET | Freq: Every day | ORAL | 3 refills | Status: DC

## 2019-08-26 NOTE — Telephone Encounter (Signed)
Requesting:ativan  Contract:no UDS:n/a Last OV:05/29/19 Next OV:n/a Last Refill:05/29/19 #[id number] Database:   Please advise

## 2019-08-27 MED ORDER — ATORVASTATIN CALCIUM 20 MG PO TABS: 20.0000 mg | ORAL_TABLET | Freq: Every day | ORAL | 1 refills | Status: DC

## 2019-08-27 MED ORDER — SERTRALINE HCL 100 MG PO TABS: ORAL_TABLET | ORAL | 3 refills | Status: DC

## 2019-08-27 NOTE — Telephone Encounter (Signed)
Meds refilled.

## 2019-11-10 ENCOUNTER — Ambulatory Visit: Payer: Medicare Other | Admitting: Family Medicine

## 2019-11-10 ENCOUNTER — Encounter: Payer: Self-pay | Admitting: Family Medicine

## 2019-11-10 ENCOUNTER — Other Ambulatory Visit: Payer: Self-pay

## 2019-11-10 VITALS — BP 120/80 | HR 76 | Temp 97.5°F | Resp 18 | Ht 67.0 in | Wt 248.6 lb

## 2019-11-10 DIAGNOSIS — F411 Generalized anxiety disorder: Secondary | ICD-10-CM | POA: Diagnosis not present

## 2019-11-10 DIAGNOSIS — E782 Mixed hyperlipidemia: Secondary | ICD-10-CM | POA: Diagnosis not present

## 2019-11-10 DIAGNOSIS — Z79899 Other long term (current) drug therapy: Secondary | ICD-10-CM

## 2019-11-10 LAB — COMPREHENSIVE METABOLIC PANEL
ALT: 15 U/L (ref 0–35)
AST: 12 U/L (ref 0–37)
Albumin: 4.4 g/dL (ref 3.5–5.2)
Alkaline Phosphatase: 75 U/L (ref 39–117)
BUN: 16 mg/dL (ref 6–23)
CO2: 27 mEq/L (ref 19–32)
Calcium: 9.3 mg/dL (ref 8.4–10.5)
Chloride: 102 mEq/L (ref 96–112)
Creatinine, Ser: 0.7 mg/dL (ref 0.40–1.20)
GFR: 82.1 mL/min (ref >60.00)
Glucose, Bld: 103 mg/dL — ABNORMAL HIGH (ref 70–99)
Potassium: 4.6 mEq/L (ref 3.5–5.1)
Sodium: 137 mEq/L (ref 135–145)
Total Bilirubin: 0.4 mg/dL (ref 0.2–1.2)
Total Protein: 6.9 g/dL (ref 6.0–8.3)

## 2019-11-10 LAB — LIPID PANEL
Cholesterol: 187 mg/dL (ref 0–200)
HDL: 58.6 mg/dL (ref >39.00)
LDL Cholesterol: 105 mg/dL — ABNORMAL HIGH (ref 0–99)
NonHDL: 128.32
Total CHOL/HDL Ratio: 3
Triglycerides: 115 mg/dL (ref 0.0–149.0)
VLDL: 23 mg/dL (ref 0.0–40.0)

## 2019-11-10 LAB — TIQ- MISLABELED

## 2019-11-10 MED ORDER — SERTRALINE HCL 100 MG PO TABS: ORAL_TABLET | ORAL | 3 refills | Status: DC

## 2019-11-10 MED ORDER — ATORVASTATIN CALCIUM 20 MG PO TABS: 20.0000 mg | ORAL_TABLET | Freq: Every day | ORAL | 1 refills | Status: DC

## 2019-11-10 MED ORDER — LORAZEPAM 0.5 MG PO TABS: 0.5000 mg | ORAL_TABLET | Freq: Three times a day (TID) | ORAL | 1 refills | Status: DC | PRN

## 2019-11-10 NOTE — Assessment & Plan Note (Signed)
Stable con't meds 

## 2019-11-10 NOTE — Addendum Note (Signed)
Addended by: Kelle Darting A on: 11/10/2019 04:44 PM   Modules accepted: Orders

## 2019-11-10 NOTE — Patient Instructions (Signed)

## 2019-11-10 NOTE — Progress Notes (Signed)
Patient ID: Allison Cox, female    DOB: 09-24-1945  Age: 74 y.o. MRN: [id number]    Subjective:  Subjective  HPI Allison Cox presents for f/u anxiety and cholesterol  Review of Systems  Constitutional: Negative for appetite change, diaphoresis, fatigue and unexpected weight change.  Eyes: Negative for pain, redness and visual disturbance.  Respiratory: Negative for cough, chest tightness, shortness of breath and wheezing.   Cardiovascular: Negative for chest pain, palpitations and leg swelling.  Endocrine: Negative for cold intolerance, heat intolerance, polydipsia, polyphagia and polyuria.  Genitourinary: Negative for difficulty urinating, dysuria and frequency.  Neurological: Negative for dizziness, light-headedness, numbness and headaches.    History Past Medical History:  Diagnosis Date  . Anxiety   . Arthritis    knee  . Colon polyps    hyperplastic and Adenomatous  . Hyperlipidemia   . Osteopenia     She has a past surgical history that includes Carpal tunnel release (Left); Knee arthroscopy (Right); Colonoscopy (07/2012); and Wisdom tooth extraction.   Her family history includes Coronary artery disease in her unknown relative; Heart attack in her father; Stroke in her mother.She reports that she quit smoking about 33 years ago. Her smoking use included cigarettes. She has never used smokeless tobacco. She reports current alcohol use of about 3.0 - 4.0 standard drinks of alcohol per week. She reports that she does not use drugs.  No current outpatient medications on file prior to visit.   Current Facility-Administered Medications on File Prior to Visit  Medication Dose Route Frequency Provider Last Rate Last Admin  . 0.9 %  sodium chloride infusion  500 mL Intravenous Once Irene Shipper, MD         Objective:  Objective  Physical Exam Vitals and nursing note reviewed.  Constitutional:      Appearance: She is well-developed.  HENT:     Head:  Normocephalic and atraumatic.  Eyes:     Conjunctiva/sclera: Conjunctivae normal.  Neck:     Thyroid: No thyromegaly.     Vascular: No carotid bruit or JVD.  Cardiovascular:     Rate and Rhythm: Normal rate and regular rhythm.     Heart sounds: Normal heart sounds. No murmur heard.   Pulmonary:     Effort: Pulmonary effort is normal. No respiratory distress.     Breath sounds: Normal breath sounds. No wheezing or rales.  Chest:     Chest wall: No tenderness.  Musculoskeletal:     Cervical back: Normal range of motion and neck supple.  Neurological:     Mental Status: She is alert and oriented to person, place, and time.    BP 120/80 (BP Location: Right Arm, Patient Position: Sitting, Cuff Size: Large)   Pulse 76   Temp (!) 97.5 F (36.4 C) (Temporal)   Resp 18   Ht 5\' 7"  (1.702 m)   Wt 248 lb 9.6 oz (112.8 kg)   SpO2 96%   BMI 38.94 kg/m  Wt Readings from Last 3 Encounters:  11/10/19 248 lb 9.6 oz (112.8 kg)  05/29/19 242 lb 12.8 oz (110.1 kg)  12/13/18 245 lb (111.1 kg)     Lab Results  Component Value Date   WBC 5.2 01/10/2018   HGB 13.0 01/10/2018   HCT 38.7 01/10/2018   PLT 221.0 01/10/2018   GLUCOSE 112 (H) 05/29/2019   CHOL 188 05/29/2019   TRIG 101.0 05/29/2019   HDL 64.70 05/29/2019   LDLDIRECT 125.1 10/06/2011   LDLCALC 103 (  H) 05/29/2019   ALT 15 05/29/2019   AST 14 05/29/2019   NA 137 05/29/2019   K 4.5 05/29/2019   CL 101 05/29/2019   CREATININE 0.83 05/29/2019   BUN 22 05/29/2019   CO2 30 05/29/2019   TSH 2.68 08/11/2009    US Abdomen Limited RUQ  Result Date: 01/11/2018 CLINICAL DATA:  Right upper quadrant abdominal pain. EXAM: ULTRASOUND ABDOMEN LIMITED RIGHT UPPER QUADRANT COMPARISON:  CT scan of February 19, 2014. FINDINGS: Gallbladder: No gallstones or wall thickening visualized. No sonographic Murphy sign noted by sonographer. Common bile duct: Diameter: 3.7 mm which is within normal limits. Liver: No focal lesion identified. Within  normal limits in parenchymal echogenicity. Portal vein is patent on color Doppler imaging with normal direction of blood flow towards the liver. IMPRESSION: No definite abnormality seen in the right upper quadrant of the abdomen. Electronically Signed   By: Marijo Conception, M.D.   On: 01/11/2018 14:08     Assessment & Plan:  Plan  I am having Allison Fail "Gay" maintain her atorvastatin, LORazepam, and sertraline. We will continue to administer sodium chloride.  Meds ordered this encounter  Medications  . atorvastatin (LIPITOR) 20 MG tablet    Sig: Take 1 tablet (20 mg total) by mouth daily.    Dispense:  90 tablet    Refill:  1  . LORazepam (ATIVAN) 0.5 MG tablet    Sig: Take 1 tablet (0.5 mg total) by mouth every 8 (eight) hours as needed for anxiety.    Dispense:  90 tablet    Refill:  1    Not to exceed 5 additional fills before 06/01/2019  . sertraline (ZOLOFT) 100 MG tablet    Sig: TAKE 1 AND 1/2 TABLETS BY MOUTH EVERY DAY    Dispense:  135 tablet    Refill:  3    Problem List Items Addressed This Visit      Unprioritized   Anxiety state    Stable con't meds      Relevant Medications   LORazepam (ATIVAN) 0.5 MG tablet   sertraline (ZOLOFT) 100 MG tablet   Generalized anxiety disorder   Relevant Medications   LORazepam (ATIVAN) 0.5 MG tablet   sertraline (ZOLOFT) 100 MG tablet   Hyperlipidemia    Encouraged heart healthy diet, increase exercise, avoid trans fats, consider a krill oil cap daily      Relevant Medications   atorvastatin (LIPITOR) 20 MG tablet   Other Relevant Orders   Lipid panel   Comprehensive metabolic panel    Other Visit Diagnoses    High risk medication use    -  Primary   Relevant Orders   DRUG MONITORING, PANEL 8 WITH CONFIRMATION, URINE      Follow-up: Return in about 6 months (around 05/11/2020), or if symptoms worsen or fail to improve, for annual exam, fasting.  [name], DO

## 2019-11-10 NOTE — Assessment & Plan Note (Signed)
Encouraged heart healthy diet, increase exercise, avoid trans fats, consider a krill oil cap daily 

## 2019-11-12 LAB — DRUG MONITORING, PANEL 8 WITH CONFIRMATION, URINE

## 2019-11-12 LAB — DM TEMPLATE

## 2019-11-13 LAB — DRUG MONITORING, PANEL 8 WITH CONFIRMATION, URINE
6 Acetylmorphine: NEGATIVE ng/mL (ref <10)
Alcohol Metabolites: POSITIVE ng/mL — AB
Alphahydroxyalprazolam: NEGATIVE ng/mL (ref <25)
Alphahydroxymidazolam: NEGATIVE ng/mL (ref <50)
Alphahydroxytriazolam: NEGATIVE ng/mL (ref <50)
Aminoclonazepam: NEGATIVE ng/mL (ref <25)
Amphetamines: NEGATIVE ng/mL (ref <500)
Benzodiazepines: POSITIVE ng/mL — AB (ref <100)
Buprenorphine, Urine: NEGATIVE ng/mL (ref <5)
Cocaine Metabolite: NEGATIVE ng/mL (ref <150)
Creatinine: 84.2 mg/dL
Ethyl Glucuronide (ETG): 8178 ng/mL — ABNORMAL HIGH (ref <500)
Ethyl Sulfate (ETS): 2617 ng/mL — ABNORMAL HIGH (ref <100)
Hydroxyethylflurazepam: NEGATIVE ng/mL (ref <50)
Lorazepam: 234 ng/mL — ABNORMAL HIGH (ref <50)
MDMA: NEGATIVE ng/mL (ref <500)
Marijuana Metabolite: NEGATIVE ng/mL (ref <20)
Nordiazepam: NEGATIVE ng/mL (ref <50)
Opiates: NEGATIVE ng/mL (ref <100)
Oxazepam: NEGATIVE ng/mL (ref <50)
Oxidant: NEGATIVE ug/mL
Oxycodone: NEGATIVE ng/mL (ref <100)
Temazepam: NEGATIVE ng/mL (ref <50)
pH: 5.4 (ref 4.5–9.0)

## 2019-11-13 LAB — DM TEMPLATE

## 2020-03-02 DIAGNOSIS — H6503 Acute serous otitis media, bilateral: Secondary | ICD-10-CM | POA: Diagnosis not present

## 2020-03-02 DIAGNOSIS — J0110 Acute frontal sinusitis, unspecified: Secondary | ICD-10-CM | POA: Diagnosis not present

## 2020-04-07 ENCOUNTER — Other Ambulatory Visit: Payer: Self-pay

## 2020-04-07 ENCOUNTER — Emergency Department
Admission: RE | Admit: 2020-04-07 | Discharge: 2020-04-07 | Disposition: A | Discharge status: Home / Self Care | Payer: Medicare Other | Source: Ambulatory Visit | Attending: Family Medicine | Admitting: Family Medicine

## 2020-04-07 VITALS — BP 153/82 | HR 74 | Temp 98.4°F | Resp 17

## 2020-04-07 DIAGNOSIS — N3090 Cystitis, unspecified without hematuria: Secondary | ICD-10-CM | POA: Diagnosis not present

## 2020-04-07 DIAGNOSIS — R300 Dysuria: Secondary | ICD-10-CM | POA: Diagnosis not present

## 2020-04-07 LAB — POCT URINALYSIS DIP (MANUAL ENTRY)
Bilirubin, UA: NEGATIVE
Glucose, UA: NEGATIVE mg/dL
Ketones, POC UA: NEGATIVE mg/dL
Nitrite, UA: NEGATIVE
Protein Ur, POC: NEGATIVE mg/dL
Spec Grav, UA: 1.025 (ref 1.010–1.025)
Urobilinogen, UA: 0.2 E.U./dL
pH, UA: 6 (ref 5.0–8.0)

## 2020-04-07 MED ORDER — CEPHALEXIN 500 MG PO CAPS: 500.0000 mg | ORAL_CAPSULE | Freq: Two times a day (BID) | ORAL | 0 refills | Status: AC

## 2020-04-07 NOTE — Discharge Instructions (Addendum)
Increase fluid intake. May use non-prescription AZO for about two days, if desired, to decrease urinary discomfort.  If symptoms become significantly worse during the night or over the weekend, proceed to the local emergency room.  

## 2020-04-07 NOTE — ED Provider Notes (Signed)
[facility]    CSN: [id number] Arrival date & time: 04/07/20  1043      History   Chief Complaint Chief Complaint  Patient presents with  . Dysuria    HPI Allison Cox is a 74 y.o. female.   Patient developed urinary frequency three days ago, and dysuria today.  She does not feel ill but experienced chills today.  The history is provided by the patient.  Dysuria Pain quality:  Burning Pain severity:  Mild Onset quality:  Sudden Duration:  3 days Timing:  Constant Progression:  Worsening Chronicity:  New Recent urinary tract infections: no   Relieved by:  None tried Worsened by:  Nothing Ineffective treatments:  None tried Urinary symptoms: frequent urination and hesitancy   Urinary symptoms: no discolored urine, no foul-smelling urine, no hematuria and no bladder incontinence   Associated symptoms: no abdominal pain, no fever, no flank pain, no genital lesions, no nausea and no vaginal discharge     Past Medical History:  Diagnosis Date  . Anxiety   . Arthritis    knee  . Colon polyps    hyperplastic and Adenomatous  . Hyperlipidemia   . Osteopenia     Patient Active Problem List   Diagnosis Date Noted  . Generalized anxiety disorder 08/10/2017  . Seasonal allergies 03/08/2017  . Essential hypertension 10/06/2016  . Abdominal pain 02/19/2014  . Obesity (BMI 30-39.9) 10/02/2013  . 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 LOW BACK PAIN, CHRONIC 08/11/2009  . COLONIC POLYPS, HYPERPLASTIC, HX OF 08/17/2008  . Acute upper respiratory infection 04/17/2008  . SINUSITIS- ACUTE-NOS 03/30/2008  . Hyperlipidemia 09/26/2007  . Anxiety state 01/01/2007  . CYST, SEBACEOUS 01/01/2007  . OSTEOPENIA 01/01/2007  . ARTHROSCOPY, RIGHT KNEE, HX OF 01/01/2007    Past Surgical History:  Procedure Laterality Date  . CARPAL TUNNEL RELEASE Left   . COLONOSCOPY  07/2012   Henrene Pastor - polyps  . KNEE ARTHROSCOPY Right   . WISDOM TOOTH EXTRACTION      OB  History   No obstetric history on file.      Home Medications    Prior to Admission medications   Medication Sig Start Date End Date Taking? Authorizing Provider  atorvastatin (LIPITOR) 20 MG tablet Take 1 tablet (20 mg total) by mouth daily. 11/10/19   [name], DO  cephALEXin (KEFLEX) 500 MG capsule Take 1 capsule (500 mg total) by mouth 2 (two) times daily for 7 days. 04/07/20 04/14/20  Kandra Nicolas, MD  LORazepam (ATIVAN) 0.5 MG tablet Take 1 tablet (0.5 mg total) by mouth every 8 (eight) hours as needed for anxiety. 11/10/19   Roma Schanz R, DO  sertraline (ZOLOFT) 100 MG tablet TAKE 1 AND 1/2 TABLETS BY MOUTH EVERY DAY 11/10/19   [name], DO    Family History Family History  Problem Relation Age of Onset  . Coronary artery disease Other   . Stroke Mother   . Heart attack Father   . Colon cancer Neg Hx   . Rectal cancer Neg Hx   . Stomach cancer Neg Hx     Social History Social History   Tobacco Use  . Smoking status: Former Smoker    Types: Cigarettes    Quit date: 05/15/1986    Years since quitting: 33.9  . Smokeless tobacco: Never Used  Vaping Use  . Vaping Use: Never used  Substance Use Topics  . Alcohol use: Yes  Alcohol/week: 3.0 - 4.0 standard drinks    Types: 3 - 4 Glasses of wine per week  . Drug use: No     Allergies   Codeine   Review of Systems Review of Systems  Constitutional: Positive for chills. Negative for diaphoresis, fatigue and fever.  Gastrointestinal: Negative for abdominal pain and nausea.  Genitourinary: Positive for dysuria, frequency and urgency. Negative for flank pain, hematuria, pelvic pain and vaginal discharge.  All other systems reviewed and are negative.    Physical Exam Triage Vital Signs ED Triage Vitals  Enc Vitals Group     BP 04/07/20 1054 (!) 153/82     Pulse Rate 04/07/20 1054 74     Resp 04/07/20 1054 17     Temp 04/07/20 1054 98.4 F (36.9 C)     Temp Source 04/07/20  1054 Oral     SpO2 04/07/20 1054 96 %     Weight --      Height --      Head Circumference --      Peak Flow --      Pain Score 04/07/20 1055 1     Pain Loc --      Pain Edu? --      Excl. in [place]? --    No data found.  Updated Vital Signs BP (!) 153/82 (BP Location: Left Arm)   Pulse 74   Temp 98.4 F (36.9 C) (Oral)   Resp 17   SpO2 96%   Visual Acuity Right Eye Distance:   Left Eye Distance:   Bilateral Distance:    Right Eye Near:   Left Eye Near:    Bilateral Near:     Physical Exam Nursing notes and Vital Signs reviewed. Appearance:  Patient appears stated age, and in no acute distress.    Eyes:  Pupils are equal, round, and reactive to light and accomodation.  Extraocular movement is intact.  Conjunctivae are not inflamed   Pharynx:  Normal; moist mucous membranes  Neck:  Supple.  No adenopathy Lungs:  Clear to auscultation.  Breath sounds are equal.  Moving air well. Heart:  Regular rate and rhythm without murmurs, rubs, or gallops.  Abdomen:  Nontender without masses or hepatosplenomegaly.  Bowel sounds are present.  No CVA or flank tenderness.  Extremities:  No edema.  Skin:  No rash present.     UC Treatments / Results  Labs (all labs ordered are listed, but only abnormal results are displayed) Labs Reviewed  POCT URINALYSIS DIP (MANUAL ENTRY) - Abnormal; Notable for the following components:      Result Value   Blood, UA moderate (*)    Leukocytes, UA Moderate (2+) (*)    All other components within normal limits  URINE CULTURE    EKG   Radiology No results found.  Procedures Procedures (including critical care time)  Medications Ordered in UC Medications - No data to display  Initial Impression / Assessment and Plan / UC Course  I have reviewed the triage vital signs and the nursing notes.  Pertinent labs & imaging results that were available during my care of the patient were reviewed by me and considered in my medical decision making  (see chart for details).    Urine culture pending.  Begin Keflex. Followup with Family Doctor if not improved in one week.    Final Clinical Impressions(s) / UC Diagnoses   Final diagnoses:  Dysuria  Cystitis     Discharge Instructions  Increase fluid intake. May use non-prescription AZO for about two days, if desired, to decrease urinary discomfort.   If symptoms become significantly worse during the night or over the weekend, proceed to the local emergency room.    ED Prescriptions    Medication Sig Dispense Auth. Provider   cephALEXin (KEFLEX) 500 MG capsule Take 1 capsule (500 mg total) by mouth 2 (two) times daily for 7 days. 14 capsule Kandra Nicolas, MD        Kandra Nicolas, MD 04/14/20 Pauline Aus

## 2020-04-07 NOTE — ED Triage Notes (Signed)
Pt c/o urinary frequency along with dysuria x 2 days.

## 2020-04-09 LAB — URINE CULTURE
MICRO NUMBER:: 11244227
SPECIMEN QUALITY:: ADEQUATE

## 2020-04-27 ENCOUNTER — Other Ambulatory Visit: Payer: Self-pay

## 2020-04-27 ENCOUNTER — Ambulatory Visit (HOSPITAL_BASED_OUTPATIENT_CLINIC_OR_DEPARTMENT_OTHER)
Admission: RE | Admit: 2020-04-27 | Discharge: 2020-04-27 | Disposition: A | Discharge status: Home / Self Care | Payer: Medicare Other | Source: Ambulatory Visit | Attending: Family Medicine | Admitting: Family Medicine

## 2020-04-27 ENCOUNTER — Encounter: Payer: Self-pay | Admitting: Family Medicine

## 2020-04-27 ENCOUNTER — Ambulatory Visit: Payer: Medicare Other | Admitting: Family Medicine

## 2020-04-27 VITALS — BP 120/82 | HR 62 | Temp 97.8°F | Resp 18 | Ht 67.0 in | Wt 245.0 lb

## 2020-04-27 DIAGNOSIS — M25551 Pain in right hip: Secondary | ICD-10-CM

## 2020-04-27 DIAGNOSIS — I10 Essential (primary) hypertension: Secondary | ICD-10-CM | POA: Diagnosis not present

## 2020-04-27 DIAGNOSIS — F411 Generalized anxiety disorder: Secondary | ICD-10-CM | POA: Diagnosis not present

## 2020-04-27 DIAGNOSIS — E782 Mixed hyperlipidemia: Secondary | ICD-10-CM

## 2020-04-27 DIAGNOSIS — M545 Low back pain, unspecified: Secondary | ICD-10-CM | POA: Diagnosis not present

## 2020-04-27 LAB — LIPID PANEL
Cholesterol: 160 mg/dL (ref 0–200)
HDL: 61.7 mg/dL (ref >39.00)
LDL Cholesterol: 81 mg/dL (ref 0–99)
NonHDL: 98.38
Total CHOL/HDL Ratio: 3
Triglycerides: 85 mg/dL (ref 0.0–149.0)
VLDL: 17 mg/dL (ref 0.0–40.0)

## 2020-04-27 LAB — COMPREHENSIVE METABOLIC PANEL
ALT: 18 U/L (ref 0–35)
AST: 15 U/L (ref 0–37)
Albumin: 4.2 g/dL (ref 3.5–5.2)
Alkaline Phosphatase: 65 U/L (ref 39–117)
BUN: 12 mg/dL (ref 6–23)
CO2: 30 mEq/L (ref 19–32)
Calcium: 9.1 mg/dL (ref 8.4–10.5)
Chloride: 102 mEq/L (ref 96–112)
Creatinine, Ser: 0.68 mg/dL (ref 0.40–1.20)
GFR: 86.58 mL/min (ref >60.00)
Glucose, Bld: 92 mg/dL (ref 70–99)
Potassium: 4.3 mEq/L (ref 3.5–5.1)
Sodium: 139 mEq/L (ref 135–145)
Total Bilirubin: 0.5 mg/dL (ref 0.2–1.2)
Total Protein: 6.5 g/dL (ref 6.0–8.3)

## 2020-04-27 MED ORDER — LORAZEPAM 0.5 MG PO TABS: 0.5000 mg | ORAL_TABLET | Freq: Three times a day (TID) | ORAL | 1 refills | Status: DC | PRN

## 2020-04-27 MED ORDER — ATORVASTATIN CALCIUM 20 MG PO TABS: 20.0000 mg | ORAL_TABLET | Freq: Every day | ORAL | 1 refills | Status: DC

## 2020-04-27 MED ORDER — PREDNISONE 10 MG PO TABS: ORAL_TABLET | ORAL | 0 refills | Status: DC

## 2020-04-27 MED ORDER — SERTRALINE HCL 100 MG PO TABS: ORAL_TABLET | ORAL | 3 refills | Status: DC

## 2020-04-27 MED ORDER — METHOCARBAMOL 500 MG PO TABS: 500.0000 mg | ORAL_TABLET | Freq: Four times a day (QID) | ORAL | 0 refills | Status: DC

## 2020-04-27 NOTE — Assessment & Plan Note (Signed)
pred taper and muscle relaxer  Consider sport med/ PT

## 2020-04-27 NOTE — Assessment & Plan Note (Signed)
Stable con't meds 

## 2020-04-27 NOTE — Assessment & Plan Note (Signed)
Well controlled, no changes to meds. Encouraged heart healthy diet such as the DASH diet and exercise as tolerated.  °

## 2020-04-27 NOTE — Patient Instructions (Signed)
Hip Pain The hip is the joint between the upper legs and the lower pelvis. The bones, cartilage, tendons, and muscles of your hip joint support your body and allow you to move around. Hip pain can range from a minor ache to severe pain in one or both of your hips. The pain may be felt on the inside of the hip joint near the groin, or on the outside near the buttocks and upper thigh. You may also have swelling or stiffness in your hip area. Follow these instructions at home: Managing pain, stiffness, and swelling      If directed, put ice on the painful area. To do this: ? Put ice in a plastic bag. ? Place a towel between your skin and the bag. ? Leave the ice on for 20 minutes, 2-3 times a day.  If directed, apply heat to the affected area as often as told by your health care provider. Use the heat source that your health care provider recommends, such as a moist heat pack or a heating pad. ? Place a towel between your skin and the heat source. ? Leave the heat on for 20-30 minutes. ? Remove the heat if your skin turns bright red. This is especially important if you are unable to feel pain, heat, or cold. You may have a greater risk of getting burned. Activity  Do exercises as told by your health care provider.  Avoid activities that cause pain. General instructions   Take over-the-counter and prescription medicines only as told by your health care provider.  Keep a journal of your symptoms. Write down: ? How often you have hip pain. ? The location of your pain. ? What the pain feels like. ? What makes the pain worse.  Sleep with a pillow between your legs on your most comfortable side.  Keep all follow-up visits as told by your health care provider. This is important. Contact a health care provider if:  You cannot put weight on your leg.  Your pain or swelling continues or gets worse after one week.  It gets harder to walk.  You have a fever. Get help right away  if:  You fall.  You have a sudden increase in pain and swelling in your hip.  Your hip is red or swollen or very tender to touch. Summary  Hip pain can range from a minor ache to severe pain in one or both of your hips.  The pain may be felt on the inside of the hip joint near the groin, or on the outside near the buttocks and upper thigh.  Avoid activities that cause pain.  Write down how often you have hip pain, the location of the pain, what makes it worse, and what it feels like. This information is not intended to replace advice given to you by your health care provider. Make sure you discuss any questions you have with your health care provider. Document Revised: 09/16/2018 Document Reviewed: 09/16/2018 Elsevier Patient Education  2020 [employer] -- 

## 2020-04-27 NOTE — Progress Notes (Signed)
Patient ID: Allison Cox, female    DOB: [birth year]  Age: 74 y.o. MRN: [id number]    Subjective:  Subjective  HPI Allison Cox presents for f/u chol , and anxiety  but she is also c/o R hip pain that radiates to r foot.  It started when she stuck her leg into car getting in and is getting worse      Pain started about 4 months ago---  Not taking any otc meds  Review of Systems  Constitutional: Negative for appetite change, diaphoresis, fatigue and unexpected weight change.  Eyes: Negative for pain, redness and visual disturbance.  Respiratory: Negative for cough, chest tightness, shortness of breath and wheezing.   Cardiovascular: Negative for chest pain, palpitations and leg swelling.  Endocrine: Negative for cold intolerance, heat intolerance, polydipsia, polyphagia and polyuria.  Genitourinary: Negative for difficulty urinating, dysuria and frequency.  Musculoskeletal: Positive for back pain and gait problem.  Neurological: Negative for dizziness, light-headedness, numbness and headaches.  Psychiatric/Behavioral: Negative.     History Past Medical History:  Diagnosis Date  . Anxiety   . Arthritis    knee  . Colon polyps    hyperplastic and Adenomatous  . Hyperlipidemia   . Osteopenia     She has a past surgical history that includes Carpal tunnel release (Left); Knee arthroscopy (Right); Colonoscopy (07/2012); and Wisdom tooth extraction.   Her family history includes Coronary artery disease in an other family member; Heart attack in her father; Stroke in her mother.She reports that she quit smoking about 33 years ago. Her smoking use included cigarettes. She has never used smokeless tobacco. She reports current alcohol use of about 3.0 - 4.0 standard drinks of alcohol per week. She reports that she does not use drugs.  No current outpatient medications on file prior to visit.   Current Facility-Administered Medications on File Prior to Visit  Medication Dose Route  Frequency Provider Last Rate Last Admin  . 0.9 %  sodium chloride infusion  500 mL Intravenous Once Irene Shipper, MD         Objective:  Objective  Physical Exam Vitals and nursing note reviewed.  Constitutional:      Appearance: She is well-developed and well-nourished.  HENT:     Head: Normocephalic and atraumatic.  Eyes:     Extraocular Movements: EOM normal.     Conjunctiva/sclera: Conjunctivae normal.  Neck:     Thyroid: No thyromegaly.     Vascular: No carotid bruit or JVD.  Cardiovascular:     Rate and Rhythm: Normal rate and regular rhythm.     Heart sounds: Normal heart sounds. No murmur heard.   Pulmonary:     Effort: Pulmonary effort is normal. No respiratory distress.     Breath sounds: Normal breath sounds. No wheezing or rales.  Chest:     Chest wall: No tenderness.  Musculoskeletal:        General: No edema.     Cervical back: Normal range of motion and neck supple.  Neurological:     General: No focal deficit present.     Mental Status: She is alert and oriented to person, place, and time.     Deep Tendon Reflexes: Reflexes normal.  Psychiatric:        Mood and Affect: Mood and affect normal.    BP 120/82 (BP Location: Right Arm, Patient Position: Sitting, Cuff Size: Large)   Pulse 62   Temp 97.8 F (36.6 C) (Oral)   Resp  18   Ht 5\' 7"  (1.702 m)   Wt 245 lb (111.1 kg)   SpO2 97%   BMI 38.37 kg/m  Wt Readings from Last 3 Encounters:  04/27/20 245 lb (111.1 kg)  11/10/19 248 lb 9.6 oz (112.8 kg)  05/29/19 242 lb 12.8 oz (110.1 kg)     Lab Results  Component Value Date   WBC 5.2 01/10/2018   HGB 13.0 01/10/2018   HCT 38.7 01/10/2018   PLT 221.0 01/10/2018   GLUCOSE 103 (H) 11/10/2019   CHOL 187 11/10/2019   TRIG 115.0 11/10/2019   HDL 58.60 11/10/2019   LDLDIRECT 125.1 10/06/2011   LDLCALC 105 (H) 11/10/2019   ALT 15 11/10/2019   AST 12 11/10/2019   NA 137 11/10/2019   K 4.6 11/10/2019   CL 102 11/10/2019   CREATININE 0.70  11/10/2019   BUN 16 11/10/2019   CO2 27 11/10/2019   TSH 2.68 08/11/2009    No results found.   Assessment & Plan:  Plan  I am having Allison Fail "Gay" start on methocarbamol and predniSONE. I am also having her maintain her atorvastatin, LORazepam, and sertraline. We will continue to administer sodium chloride.  Meds ordered this encounter  Medications  . atorvastatin (LIPITOR) 20 MG tablet    Sig: Take 1 tablet (20 mg total) by mouth daily.    Dispense:  90 tablet    Refill:  1  . LORazepam (ATIVAN) 0.5 MG tablet    Sig: Take 1 tablet (0.5 mg total) by mouth every 8 (eight) hours as needed for anxiety.    Dispense:  90 tablet    Refill:  1    Not to exceed 5 additional fills before 06/01/2019  . sertraline (ZOLOFT) 100 MG tablet    Sig: TAKE 1 AND 1/2 TABLETS BY MOUTH EVERY DAY    Dispense:  135 tablet    Refill:  3  . methocarbamol (ROBAXIN) 500 MG tablet    Sig: Take 1 tablet (500 mg total) by mouth 4 (four) times daily.    Dispense:  45 tablet    Refill:  0  . predniSONE (DELTASONE) 10 MG tablet    Sig: TAKE 3 TABLETS PO QD FOR 3 DAYS THEN TAKE 2 TABLETS PO QD FOR 3 DAYS THEN TAKE 1 TABLET PO QD FOR 3 DAYS THEN TAKE 1/2 TAB PO QD FOR 3 DAYS    Dispense:  20 tablet    Refill:  0    Problem List Items Addressed This Visit      Unprioritized   Essential hypertension    Well controlled, no changes to meds. Encouraged heart healthy diet such as the DASH diet and exercise as tolerated.       Relevant Medications   atorvastatin (LIPITOR) 20 MG tablet   Generalized anxiety disorder    Stable con't meds      Relevant Medications   LORazepam (ATIVAN) 0.5 MG tablet   sertraline (ZOLOFT) 100 MG tablet   Hyperlipidemia   Relevant Medications   atorvastatin (LIPITOR) 20 MG tablet   Other Relevant Orders   Lipid panel   Comprehensive metabolic panel   Right hip pain - Primary    pred taper and muscle relaxer  Consider sport med/ PT      Relevant Medications    methocarbamol (ROBAXIN) 500 MG tablet   predniSONE (DELTASONE) 10 MG tablet   Other Relevant Orders   DG Hip Unilat W OR W/O Pelvis 2-3 Views Right  DG Lumbar Spine Complete      Follow-up: Return in about 6 months (around 10/26/2020), or if symptoms worsen or fail to improve, for annual exam, fasting.  [name], DO

## 2020-09-13 ENCOUNTER — Other Ambulatory Visit: Payer: Self-pay | Admitting: Family Medicine

## 2020-09-13 ENCOUNTER — Encounter: Payer: Self-pay | Admitting: Family Medicine

## 2020-09-13 DIAGNOSIS — M419 Scoliosis, unspecified: Secondary | ICD-10-CM

## 2020-09-13 DIAGNOSIS — Z1231 Encounter for screening mammogram for malignant neoplasm of breast: Secondary | ICD-10-CM

## 2020-09-28 ENCOUNTER — Encounter: Payer: Self-pay | Admitting: Family Medicine

## 2020-09-28 ENCOUNTER — Ambulatory Visit (INDEPENDENT_AMBULATORY_CARE_PROVIDER_SITE_OTHER): Payer: Medicare Other | Admitting: Family Medicine

## 2020-09-28 ENCOUNTER — Ambulatory Visit: Payer: Self-pay

## 2020-09-28 ENCOUNTER — Other Ambulatory Visit: Payer: Self-pay

## 2020-09-28 DIAGNOSIS — M25552 Pain in left hip: Secondary | ICD-10-CM

## 2020-09-28 DIAGNOSIS — G8929 Other chronic pain: Secondary | ICD-10-CM

## 2020-09-28 DIAGNOSIS — M545 Low back pain, unspecified: Secondary | ICD-10-CM

## 2020-09-28 DIAGNOSIS — M25562 Pain in left knee: Secondary | ICD-10-CM

## 2020-09-28 NOTE — Progress Notes (Signed)
Office Visit Note   Patient: Allison Cox           Date of Birth: 1946-03-27           MRN: [id number] Visit Date: 09/28/2020 Requested by: [address] [place], [place] [address] [place],  [place] [address] PCP: Carollee Herter, Alferd Apa, DO  Subjective: Chief Complaint  Patient presents with  . Lower Back - Pain    Chronic pain in the lower back -- "pressure" in the back with standing long periods of time. Had had issues with pain going down the right leg, but not as much lately. Does have sharp pains in the anterior hip, mainly with lifting her leg up with getting in/out of car. This has been going on x 1 year. Also, now has pain in the left knee, but unsure if it is related to the back. Started a couple mos ago. Pain lateral aspect. Knee grinds. Feels like it will give way, but has not done so. Swells sometimes.    HPI: 75yo F presenting to clinic with concerns of chronic lower back pain, as well as left lateral knee pain. Patient states that she has struggled with back pain for several years, and previously it would radiate into her right leg with a burning sensation traveling down from her back. This radiation has thankfully improved over the past few months- but around the same time that her right side improved, she started to feel pain in the lateral aspect of her left leg. States this pain hurts when she is walking, though she denies a catching/locking sensation. States 'I can feel the weather in my knees.' Pain will also wake her up when she lays on the left side, as it feels very tender to the touch.  Per her back, she says her pain is worsened with prolonged standing (esp cooking), or any flexion. She says she tried to help with a lawn mower recently, and the extended forward flexion was incredibly painful. She denies any regular daily exercises, but enjoys being active in the home when pain allows.               ROS:   All other systems were reviewed and are  negative.  Objective: Vital Signs: There were no vitals taken for this visit.  Physical Exam:  General:  Alert and oriented, in no acute distress. Pulm:  Breathing unlabored. Psy:  Normal mood, congruent affect. Skin:  Lower back and left knee with no bruising, rashes, or erythema. Overlying skin intact.   LEFT KNEE EXAM:  General: Normal gait Standing exam: No varus or valgus deformity of the knee.   Trendelenburg: Faint hip drop bilaterally with single-leg stance.   Seated Exam:  Moderate patellar crepitus. Full ROM in flexion/extension.   Palpation: Endorses Tenderness to palpation over lateral joint line, as well as very mild tenderness over medial joint line. Area of maximal tenderness, however, is just superior to lateral joint line, in the area of the lateral femoral condyle/ITB. No tenderness with palpation of patella or patellar tendon. No tenderness over patellar facets.   Supine exam: No effusion, normal patellar mobility.   Ligamentous Exam:  No pain or laxity with anterior/posterior drawer.  No obvious Sag.  No pain or laxity with varus/valgus stress across the knee.   Meniscus:  McMurray with no pain or deep clicking.    BACK EXAMINATION: Normal Spinal curvature, without excessive lumbar lordosis, thoracic kyphosis,. ROM: No Pain with  forward flexion or extension. Able to achieve Toe-Touch. No pain with Rotation or Side-bending.   Palpation: Endorses tenderness to palpation at paraspinal muscles of lower lumbar Spine, approx level of L5-S1. Bilateral tenderness at SI Joints, and within the gluteal musculature on the left. No midline tenderness, no deformity or step-offs.   Special Tests:  FABER: No SI Joint Pain FADIR does cause pain in the left groin, but no worsening back or knee pain. SLR: No radiation down Ipilateral or contralateral leg bilaterally   Strength: Hip flexion (L1), Hip Aduction (L2), Knee Extension (L3) are 5/5 Bilaterally Foot Inversion  (L4), Dorsiflexion (L5), and Eversion (S1) 5/5 Bilaterally  Sensation: Intact to light touch medial and lateral aspects of lower extremities, and lateral, dorsal, and medial aspects of foot.    Imaging: XR Knee 1-2 Views Left  Result Date: 09/28/2020 X-Rays show moderate tricompartmental DJD in the left knee, and moderate tibiofemoral DJD on the right.   CLINICAL DATA:  Low back and right hip pain.  EXAM: LUMBAR SPINE - COMPLETE 4+ VIEW  COMPARISON:  Plain films lumbar spine 07/22/2009.  FINDINGS: No fracture. Convex left lumbar scoliosis with the apex at L2-3 appears worse than on the prior study. Intervertebral disc space height is maintained. Facet arthropathy at L4-5 and L5-S1 is unchanged in appearance. Aortic atherosclerosis has progressed since the prior exam.  IMPRESSION: Convex left lumbar scoliosis with the apex at L2-3 appears worse than on the prior examination.  No change in the appearance of lower lumbar facet arthropathy.  Progressive aortic atherosclerosis.   Electronically Signed   By: Inge Rise M.D.   On: 04/27/2020 16:46  CLINICAL DATA:  Right hip pain for 4 months.  No known injury.  EXAM: DG HIP (WITH OR WITHOUT PELVIS) 2-3V RIGHT  COMPARISON:  None.  FINDINGS: There is no evidence of hip fracture or dislocation. There is no evidence of hip arthropathy or other focal bone abnormality. Mild degenerative disease about the SI joints noted.  IMPRESSION: Normal-appearing right hip.  Mild bilateral SI joint osteoarthritis.   Electronically Signed   By: Inge Rise M.D.   On: 04/27/2020 16:44  Assessment & Plan: 75yo F presenting to clinic with chronic low back pain, left lateral knee pain. Review of spinal, hip, and knee films with mild degenerative changes in knees and hips, as well as a mild scoliotic curvature in the lumbar spine. She states she has done PT many years ago, and responded well to it. Suspect she  would be a good candidate for PT intervention again at this time. - Referral placed for PT - Discussed Turmeric, Glucoasmine supplementation - If no benefit with PT, could consider MRI of lumbar spine, vs knee injection - Patient expresses understanding with plan. She has no further questions or concerns today.      Procedures: No procedures performed        PMFS History: Patient Active Problem List   Diagnosis Date Noted  . Right hip pain 04/27/2020  . Generalized anxiety disorder 08/10/2017  . Seasonal allergies 03/08/2017  . Essential hypertension 10/06/2016  . Abdominal pain 02/19/2014  . Obesity (BMI 30-39.9) 10/02/2013  . 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 LOW BACK PAIN, CHRONIC 08/11/2009  . COLONIC POLYPS, HYPERPLASTIC, HX OF 08/17/2008  . Acute upper respiratory infection 04/17/2008  . SINUSITIS- ACUTE-NOS 03/30/2008  . Hyperlipidemia 09/26/2007  . Anxiety state 01/01/2007  . CYST, SEBACEOUS 01/01/2007  . OSTEOPENIA 01/01/2007  . ARTHROSCOPY, RIGHT KNEE, HX OF 01/01/2007  Past Medical History:  Diagnosis Date  . Anxiety   . Arthritis    knee  . Colon polyps    hyperplastic and Adenomatous  . Hyperlipidemia   . Osteopenia     Family History  Problem Relation Age of Onset  . Coronary artery disease Other   . Stroke Mother   . Heart attack Father   . Colon cancer Neg Hx   . Rectal cancer Neg Hx   . Stomach cancer Neg Hx     Past Surgical History:  Procedure Laterality Date  . CARPAL TUNNEL RELEASE Left   . COLONOSCOPY  07/2012   Henrene Pastor - polyps  . KNEE ARTHROSCOPY Right   . WISDOM TOOTH EXTRACTION     Social History   Occupational History  . Not on file  Tobacco Use  . Smoking status: Former Smoker    Types: Cigarettes    Quit date: 05/15/1986    Years since quitting: 34.3  . Smokeless tobacco: Never Used  Vaping Use  . Vaping Use: Never used  Substance and Sexual Activity  . Alcohol use: Yes    Alcohol/week: 3.0 - 4.0 standard  drinks    Types: 3 - 4 Glasses of wine per week  . Drug use: No  . Sexual activity: Yes    Partners: Male    Birth control/protection: Post-menopausal

## 2020-09-28 NOTE — Patient Instructions (Signed)
    Turmeric:  500 mg twice daily  Glucosamine Sulfate:  1,000 mg twice daily    

## 2020-09-28 NOTE — Progress Notes (Signed)
I saw and examined the patient with Dr. Elouise Munroe and agree with assessment and plan as outlined.    Chronic LBP, left lateral hip pain, and left lateral knee pain.    Will try [facility] PT, turmeric, glucosamine.   Lumbar MRI if no improvement.

## 2020-10-16 ENCOUNTER — Other Ambulatory Visit: Payer: Self-pay | Admitting: Family Medicine

## 2020-10-16 DIAGNOSIS — E782 Mixed hyperlipidemia: Secondary | ICD-10-CM

## 2020-10-21 ENCOUNTER — Encounter: Payer: Self-pay | Admitting: Family Medicine

## 2020-10-21 ENCOUNTER — Other Ambulatory Visit: Payer: Self-pay

## 2020-10-21 ENCOUNTER — Ambulatory Visit (INDEPENDENT_AMBULATORY_CARE_PROVIDER_SITE_OTHER): Payer: Medicare Other | Admitting: Family Medicine

## 2020-10-21 VITALS — BP 118/72 | HR 75 | Temp 98.5°F | Resp 18 | Ht 67.0 in | Wt 238.6 lb

## 2020-10-21 DIAGNOSIS — M545 Low back pain, unspecified: Secondary | ICD-10-CM | POA: Diagnosis not present

## 2020-10-21 DIAGNOSIS — M25552 Pain in left hip: Secondary | ICD-10-CM | POA: Diagnosis not present

## 2020-10-21 DIAGNOSIS — Z0000 Encounter for general adult medical examination without abnormal findings: Secondary | ICD-10-CM | POA: Diagnosis not present

## 2020-10-21 DIAGNOSIS — M25551 Pain in right hip: Secondary | ICD-10-CM | POA: Diagnosis not present

## 2020-10-21 DIAGNOSIS — F411 Generalized anxiety disorder: Secondary | ICD-10-CM | POA: Diagnosis not present

## 2020-10-21 DIAGNOSIS — M25562 Pain in left knee: Secondary | ICD-10-CM | POA: Diagnosis not present

## 2020-10-21 DIAGNOSIS — E2839 Other primary ovarian failure: Secondary | ICD-10-CM | POA: Insufficient documentation

## 2020-10-21 DIAGNOSIS — I10 Essential (primary) hypertension: Secondary | ICD-10-CM | POA: Insufficient documentation

## 2020-10-21 DIAGNOSIS — E785 Hyperlipidemia, unspecified: Secondary | ICD-10-CM | POA: Diagnosis not present

## 2020-10-21 LAB — COMPREHENSIVE METABOLIC PANEL
ALT: 16 U/L (ref 0–35)
AST: 14 U/L (ref 0–37)
Albumin: 4.7 g/dL (ref 3.5–5.2)
Alkaline Phosphatase: 69 U/L (ref 39–117)
BUN: 21 mg/dL (ref 6–23)
CO2: 31 mEq/L (ref 19–32)
Calcium: 9.6 mg/dL (ref 8.4–10.5)
Chloride: 101 mEq/L (ref 96–112)
Creatinine, Ser: 0.78 mg/dL (ref 0.40–1.20)
GFR: 75.25 mL/min (ref >60.00)
Glucose, Bld: 109 mg/dL — ABNORMAL HIGH (ref 70–99)
Potassium: 4.4 mEq/L (ref 3.5–5.1)
Sodium: 139 mEq/L (ref 135–145)
Total Bilirubin: 0.5 mg/dL (ref 0.2–1.2)
Total Protein: 7.4 g/dL (ref 6.0–8.3)

## 2020-10-21 LAB — CBC WITH DIFFERENTIAL/PLATELET
Basophils Absolute: 0.1 10*3/uL (ref 0.0–0.1)
Basophils Relative: 0.9 % (ref 0.0–3.0)
Eosinophils Absolute: 0.1 10*3/uL (ref 0.0–0.7)
Eosinophils Relative: 1.9 % (ref 0.0–5.0)
HCT: 41 % (ref 36.0–46.0)
Hemoglobin: 14 g/dL (ref 12.0–15.0)
Lymphocytes Relative: 12.7 % (ref 12.0–46.0)
Lymphs Abs: 0.8 10*3/uL (ref 0.7–4.0)
MCHC: 34 g/dL (ref 30.0–36.0)
MCV: 88.3 fl (ref 78.0–100.0)
Monocytes Absolute: 0.5 10*3/uL (ref 0.1–1.0)
Monocytes Relative: 8.4 % (ref 3.0–12.0)
Neutro Abs: 4.6 10*3/uL (ref 1.4–7.7)
Neutrophils Relative %: 76.1 % (ref 43.0–77.0)
Platelets: 226 10*3/uL (ref 150.0–400.0)
RBC: 4.65 Mil/uL (ref 3.87–5.11)
RDW: 14.3 % (ref 11.5–15.5)
WBC: 6.1 10*3/uL (ref 4.0–10.5)

## 2020-10-21 LAB — LIPID PANEL
Cholesterol: 191 mg/dL (ref 0–200)
HDL: 63.7 mg/dL (ref >39.00)
LDL Cholesterol: 104 mg/dL — ABNORMAL HIGH (ref 0–99)
NonHDL: 127.55
Total CHOL/HDL Ratio: 3
Triglycerides: 119 mg/dL (ref 0.0–149.0)
VLDL: 23.8 mg/dL (ref 0.0–40.0)

## 2020-10-21 MED ORDER — LORAZEPAM 0.5 MG PO TABS: 0.5000 mg | ORAL_TABLET | Freq: Three times a day (TID) | ORAL | 1 refills | Status: DC | PRN

## 2020-10-21 NOTE — Progress Notes (Addendum)
Subjective:   By signing my name below, I, Allison Cox, attest that this documentation has been prepared under the direction and in the presence of Dr. Roma Schanz, DO. 10/21/2020      Patient ID: Allison Cox, female    DOB: 05-16-1945, 75 y.o.   MRN: [id number]  Chief Complaint  Patient presents with   Annual Exam    HPI Patient is in today for a comprehensive physical exam. She is requesting a refill on 0.5 mg lorazepam PO every 8 hours as needed. She reports her anxiety is control while taking 100 mg Zoloft daily PO.  She is managing her allergies well at this time.  She continues having back pain. Her orthopedics physician, Dr. Eunice Blase, manages her back pain at this time. She is also starting physical therapy today to manage her pain. She has not taken 500 mg methocarbamol PO recently due to anxiety of how it might interact with other medications.  Her blood pressure is doing well at this time. She denies having any fever, ear pain, congestion, sinus pain, sore throat, eye pain, chest pain, palpations, cough, SOB, wheezing, n/v/d, constipation, blood in stool, dysuria, frequency, hematuria, or headaches at this time. She has had no recent changes in family history. She has 4 pfizer Covid-19 vaccines. She is due for a tetanus and shingles vaccine and is planning to get both vaccines at her pharmacy. She is UTD on vision care and dental care. She does not see a dermatologist at this time.     Past Medical History:  Diagnosis Date   Anxiety    Arthritis    knee   Colon polyps    hyperplastic and Adenomatous   Hyperlipidemia    Osteopenia     Past Surgical History:  Procedure Laterality Date   CARPAL TUNNEL RELEASE Left    COLONOSCOPY  07/2012   Henrene Pastor - polyps   KNEE ARTHROSCOPY Right    WISDOM TOOTH EXTRACTION      Family History  Problem Relation Age of Onset   Coronary artery disease Other    Stroke Mother    Heart attack Father    Colon  cancer Neg Hx    Rectal cancer Neg Hx    Stomach cancer Neg Hx     Social History   Socioeconomic History   Marital status: Single    Spouse name: Not on file   Number of children: Not on file   Years of education: Not on file   Highest education level: Not on file  Occupational History   Not on file  Tobacco Use   Smoking status: Former    Pack years: 0.00    Types: Cigarettes    Quit date: 05/15/1986    Years since quitting: 34.4   Smokeless tobacco: Never  Vaping Use   Vaping Use: Never used  Substance and Sexual Activity   Alcohol use: Yes    Alcohol/week: 3.0 - 4.0 standard drinks    Types: 3 - 4 Glasses of wine per week   Drug use: No   Sexual activity: Yes    Partners: Male    Birth control/protection: Post-menopausal  Other Topics Concern   Not on file  Social History Narrative   Not on file   Social Determinants of Health   Financial Resource Strain: Not on file  Food Insecurity: Not on file  Transportation Needs: Not on file  Physical Activity: Not on file  Stress: Not on file  Social  Connections: Not on file  Intimate Partner Violence: Not on file    Outpatient Medications Prior to Visit  Medication Sig Dispense Refill   atorvastatin (LIPITOR) 20 MG tablet Take 1 tablet by mouth once daily 90 tablet 0   fluticasone (FLONASE) 50 MCG/ACT nasal spray Place into the nose.     sertraline (ZOLOFT) 100 MG tablet TAKE 1 AND 1/2 TABLETS BY MOUTH EVERY DAY 135 tablet 3   LORazepam (ATIVAN) 0.5 MG tablet Take 1 tablet (0.5 mg total) by mouth every 8 (eight) hours as needed for anxiety. 90 tablet 1   methocarbamol (ROBAXIN) 500 MG tablet Take 1 tablet (500 mg total) by mouth 4 (four) times daily. (Patient not taking: No sig reported) 45 tablet 0   Facility-Administered Medications Prior to Visit  Medication Dose Route Frequency Provider Last Rate Last Admin   0.9 %  sodium chloride infusion  500 mL Intravenous Once Irene Shipper, MD        Allergies   Allergen Reactions   Codeine Nausea And Vomiting    Review of Systems  Constitutional:  Negative for fever and malaise/fatigue.  HENT:  Negative for congestion, ear pain, sinus pain and sore throat.   Eyes:  Negative for blurred vision and pain.  Respiratory:  Negative for cough, shortness of breath and wheezing.   Cardiovascular:  Negative for chest pain, palpitations and leg swelling.  Gastrointestinal:  Negative for blood in stool, constipation, diarrhea, nausea and vomiting.  Genitourinary:  Negative for dysuria, frequency and hematuria.  Musculoskeletal:  Negative for back pain.  Skin:  Negative for rash.  Neurological:  Negative for loss of consciousness and headaches.      Objective:    Physical Exam Vitals and nursing note reviewed.  Constitutional:      General: She is not in acute distress.    Appearance: Normal appearance. She is well-developed. She is not ill-appearing.  HENT:     Head: Normocephalic and atraumatic.     Right Ear: Tympanic membrane and external ear normal.     Left Ear: Tympanic membrane and external ear normal.     Nose: Nose normal.  Eyes:     Extraocular Movements: Extraocular movements intact.     Pupils: Pupils are equal, round, and reactive to light.  Neck:     Vascular: No carotid bruit.  Cardiovascular:     Rate and Rhythm: Normal rate and regular rhythm.     Pulses: Normal pulses.     Heart sounds: Normal heart sounds. No murmur heard.   No gallop.  Pulmonary:     Effort: Pulmonary effort is normal. No respiratory distress.     Breath sounds: Normal breath sounds. No wheezing, rhonchi or rales.  Chest:     Chest wall: No tenderness.  Abdominal:     General: Bowel sounds are normal. There is no distension.     Palpations: Abdomen is soft. There is no mass.     Tenderness: There is no abdominal tenderness. There is no guarding or rebound.     Hernia: No hernia is present.  Musculoskeletal:     Cervical back: Normal range of  motion and neck supple.  Skin:    General: Skin is warm and dry.  Neurological:     Mental Status: She is alert and oriented to person, place, and time.  Psychiatric:        Behavior: Behavior normal.        Thought Content: Thought content normal.  Judgment: Judgment normal.    BP 118/72 (BP Location: Right Arm, Patient Position: Sitting, Cuff Size: Large)   Pulse 75   Temp 98.5 F (36.9 C) (Oral)   Resp 18   Ht 5\' 7"  (1.702 m)   Wt 238 lb 9.6 oz (108.2 kg)   SpO2 96%   BMI 37.37 kg/m  Wt Readings from Last 3 Encounters:  10/21/20 238 lb 9.6 oz (108.2 kg)  04/27/20 245 lb (111.1 kg)  11/10/19 248 lb 9.6 oz (112.8 kg)    Diabetic Foot Exam - Simple   No data filed    Lab Results  Component Value Date   WBC 5.2 01/10/2018   HGB 13.0 01/10/2018   HCT 38.7 01/10/2018   PLT 221.0 01/10/2018   GLUCOSE 92 04/27/2020   CHOL 160 04/27/2020   TRIG 85.0 04/27/2020   HDL 61.70 04/27/2020   LDLDIRECT 125.1 10/06/2011   LDLCALC 81 04/27/2020   ALT 18 04/27/2020   AST 15 04/27/2020   NA 139 04/27/2020   K 4.3 04/27/2020   CL 102 04/27/2020   CREATININE 0.68 04/27/2020   BUN 12 04/27/2020   CO2 30 04/27/2020   TSH 2.68 08/11/2009    Lab Results  Component Value Date   TSH 2.68 08/11/2009   Lab Results  Component Value Date   WBC 5.2 01/10/2018   HGB 13.0 01/10/2018   HCT 38.7 01/10/2018   MCV 89.3 01/10/2018   PLT 221.0 01/10/2018   Lab Results  Component Value Date   NA 139 04/27/2020   K 4.3 04/27/2020   CO2 30 04/27/2020   GLUCOSE 92 04/27/2020   BUN 12 04/27/2020   CREATININE 0.68 04/27/2020   BILITOT 0.5 04/27/2020   ALKPHOS 65 04/27/2020   AST 15 04/27/2020   ALT 18 04/27/2020   PROT 6.5 04/27/2020   ALBUMIN 4.2 04/27/2020   CALCIUM 9.1 04/27/2020   GFR 86.58 04/27/2020   Lab Results  Component Value Date   CHOL 160 04/27/2020   Lab Results  Component Value Date   HDL 61.70 04/27/2020   Lab Results  Component Value Date    LDLCALC 81 04/27/2020   Lab Results  Component Value Date   TRIG 85.0 04/27/2020   Lab Results  Component Value Date   CHOLHDL 3 04/27/2020   No results found for: HGBA1C  Mammogram- Last completed 10/11/2017. Results normal. Repeat in 1 year. She has an upcoming appointment scheduled. Dexa- Last completed 10/19/2014. Results normal.  Colonoscopy- Last completed 12/04/2017. Results found and removed four 2-5 mm polyps in the rectum, the transverse colon and ascending colon, otherwise results are normal. Repeat in 3-5 years. Pap smear- Last completed 08/11/2009. Results normal.      Health Maintenance  Topic Date Due   Pneumococcal Vaccine 46-19 Years old (1 - PCV) Never done   Zoster Vaccines- Shingrix (1 of 2) Never done   TETANUS/TDAP  06/28/2013   DEXA SCAN  10/18/2016   MAMMOGRAM  09/16/2018   INFLUENZA VACCINE  12/13/2020   COVID-19 Vaccine (5 - Booster for [place] series) 01/21/2021   COLONOSCOPY (Pts 45-31yrs Insurance coverage will need to be confirmed)  12/05/2022   Hepatitis C Screening  Completed   PNA vac Low Risk Adult  Completed   HPV VACCINES  Aged Out    Assessment & Plan:   Problem List Items Addressed This Visit       Unprioritized   Generalized anxiety disorder   Relevant Medications   LORazepam (ATIVAN)  0.5 MG tablet   Anxiety state    Stable  con't zoloft and prn ativan        Relevant Medications   LORazepam (ATIVAN) 0.5 MG tablet   RESOLVED: Essential hypertension    .sbhtn       Estrogen deficiency   Relevant Orders   DG Bone Density   Hyperlipidemia    Encouraged heart healthy diet, increase exercise, avoid trans fats, consider a krill oil cap daily       Relevant Orders   Lipid panel   Comprehensive metabolic panel   Preventative health care - Primary    ghm utd Check labs        Relevant Medications   LORazepam (ATIVAN) 0.5 MG tablet   Other Relevant Orders   CBC with Differential/Platelet   Lipid panel    Comprehensive metabolic panel   Primary hypertension     Meds ordered this encounter  Medications   LORazepam (ATIVAN) 0.5 MG tablet    Sig: Take 1 tablet (0.5 mg total) by mouth every 8 (eight) hours as needed for anxiety.    Dispense:  90 tablet    Refill:  1    Not to exceed 5 additional fills before 06/01/2019    I, Dr. Roma Schanz, DO, personally preformed the services described in this documentation.  All medical record entries made by the scribe were at my direction and in my presence.  I have reviewed the chart and discharge instructions (if applicable) and agree that the record reflects my personal performance and is accurate and complete. 10/21/2020   I,Allison Cox,acting as a scribe for [name], DO.,have documented all relevant documentation on the behalf of [name], DO,as directed by  [name], DO while in the presence of [name], DO.   [name], DO

## 2020-10-21 NOTE — Assessment & Plan Note (Signed)
ghm utd Check labs  

## 2020-10-21 NOTE — Assessment & Plan Note (Signed)
Encouraged heart healthy diet, increase exercise, avoid trans fats, consider a krill oil cap daily 

## 2020-10-21 NOTE — Assessment & Plan Note (Signed)
Stable  con't zoloft and prn ativan

## 2020-10-21 NOTE — Assessment & Plan Note (Signed)
.  sbhtn 

## 2020-10-21 NOTE — Patient Instructions (Signed)
Preventive Care 74 Years and Older, Female Preventive care refers to lifestyle choices and visits with your health care provider that can promote health and wellness. This includes:  A yearly physical exam. This is also called an annual wellness visit.  Regular dental and eye exams.  Immunizations.  Screening for certain conditions.  Healthy lifestyle choices, such as: ? Eating a healthy diet. ? Getting regular exercise. ? Not using drugs or products that contain nicotine and tobacco. ? Limiting alcohol use. What can I expect for my preventive care visit? Physical exam Your health care provider will check your:  Height and weight. These may be used to calculate your BMI (body mass index). BMI is a measurement that tells if you are at a healthy weight.  Heart rate and blood pressure.  Body temperature.  Skin for abnormal spots. Counseling Your health care provider may ask you questions about your:  Past medical problems.  Family's medical history.  Alcohol, tobacco, and drug use.  Emotional well-being.  Home life and relationship well-being.  Sexual activity.  Diet, exercise, and sleep habits.  History of falls.  Memory and ability to understand (cognition).  Work and work environment.  Pregnancy and menstrual history.  Access to firearms. What immunizations do I need? Vaccines are usually given at various ages, according to a schedule. Your health care provider will recommend vaccines for you based on your age, medical history, and lifestyle or other factors, such as travel or where you work.   What tests do I need? Blood tests  Lipid and cholesterol levels. These may be checked every 5 years, or more often depending on your overall health.  Hepatitis C test.  Hepatitis B test. Screening  Lung cancer screening. You may have this screening every year starting at age 55 if you have a 30-pack-year history of smoking and currently smoke or have quit within  the past 15 years.  Colorectal cancer screening. ? All adults should have this screening starting at age 50 and continuing until age 75. ? Your health care provider may recommend screening at age 45 if you are at increased risk. ? You will have tests every 1-10 years, depending on your results and the type of screening test.  Diabetes screening. ? This is done by checking your blood sugar (glucose) after you have not eaten for a while (fasting). ? You may have this done every 1-3 years.  Mammogram. ? This may be done every 1-2 years. ? Talk with your health care provider about how often you should have regular mammograms.  Abdominal aortic aneurysm (AAA) screening. You may need this if you are a current or former smoker.  BRCA-related cancer screening. This may be done if you have a family history of breast, ovarian, tubal, or peritoneal cancers. Other tests  STD (sexually transmitted disease) testing, if you are at risk.  Bone density scan. This is done to screen for osteoporosis. You may have this done starting at age 65. Talk with your health care provider about your test results, treatment options, and if necessary, the need for more tests. Follow these instructions at home: Eating and drinking  Eat a diet that includes fresh fruits and vegetables, whole grains, lean protein, and low-fat dairy products. Limit your intake of foods with high amounts of sugar, saturated fats, and salt.  Take vitamin and mineral supplements as recommended by your health care provider.  Do not drink alcohol if your health care provider tells you not to drink.    If you drink alcohol: ? Limit how much you have to 0-1 drink a day. ? Be aware of how much alcohol is in your drink. In the [place] one drink equals one 12 oz bottle of beer (355 mL), one 5 oz glass of wine (148 mL), or one 1 oz glass of hard liquor (44 mL).   Lifestyle  Take daily care of your teeth and gums. Brush your teeth every morning  and night with fluoride toothpaste. Floss one time each day.  Stay active. Exercise for at least 30 minutes 5 or more days each week.  Do not use any products that contain nicotine or tobacco, such as cigarettes, e-cigarettes, and chewing tobacco. If you need help quitting, ask your health care provider.  Do not use drugs.  If you are sexually active, practice safe sex. Use a condom or other form of protection in order to prevent STIs (sexually transmitted infections).  Talk with your health care provider about taking a low-dose aspirin or statin.  Find healthy ways to cope with stress, such as: ? Meditation, yoga, or listening to music. ? Journaling. ? Talking to a trusted person. ? Spending time with friends and family. Safety  Always wear your seat belt while driving or riding in a vehicle.  Do not drive: ? If you have been drinking alcohol. Do not ride with someone who has been drinking. ? When you are tired or distracted. ? While texting.  Wear a helmet and other protective equipment during sports activities.  If you have firearms in your house, make sure you follow all gun safety procedures. What's next?  Visit your health care provider once a year for an annual wellness visit.  Ask your health care provider how often you should have your eyes and teeth checked.  Stay up to date on all vaccines. This information is not intended to replace advice given to you by your health care provider. Make sure you discuss any questions you have with your health care provider. Document Revised: 04/21/2020 Document Reviewed: 04/25/2018 Elsevier Patient Education  2021 Elsevier Inc.  

## 2020-10-26 DIAGNOSIS — M25551 Pain in right hip: Secondary | ICD-10-CM | POA: Diagnosis not present

## 2020-10-26 DIAGNOSIS — M25562 Pain in left knee: Secondary | ICD-10-CM | POA: Diagnosis not present

## 2020-10-26 DIAGNOSIS — M545 Low back pain, unspecified: Secondary | ICD-10-CM | POA: Diagnosis not present

## 2020-10-26 DIAGNOSIS — M25552 Pain in left hip: Secondary | ICD-10-CM | POA: Diagnosis not present

## 2020-10-28 DIAGNOSIS — M25551 Pain in right hip: Secondary | ICD-10-CM | POA: Diagnosis not present

## 2020-10-28 DIAGNOSIS — M545 Low back pain, unspecified: Secondary | ICD-10-CM | POA: Diagnosis not present

## 2020-10-28 DIAGNOSIS — M25552 Pain in left hip: Secondary | ICD-10-CM | POA: Diagnosis not present

## 2020-10-28 DIAGNOSIS — M25562 Pain in left knee: Secondary | ICD-10-CM | POA: Diagnosis not present

## 2020-11-01 DIAGNOSIS — M25551 Pain in right hip: Secondary | ICD-10-CM | POA: Diagnosis not present

## 2020-11-01 DIAGNOSIS — M25552 Pain in left hip: Secondary | ICD-10-CM | POA: Diagnosis not present

## 2020-11-01 DIAGNOSIS — M25562 Pain in left knee: Secondary | ICD-10-CM | POA: Diagnosis not present

## 2020-11-01 DIAGNOSIS — M545 Low back pain, unspecified: Secondary | ICD-10-CM | POA: Diagnosis not present

## 2020-11-03 ENCOUNTER — Other Ambulatory Visit: Payer: Self-pay

## 2020-11-03 ENCOUNTER — Ambulatory Visit
Admission: RE | Admit: 2020-11-03 | Discharge: 2020-11-03 | Disposition: A | Discharge status: Home / Self Care | Payer: Medicare Other | Source: Ambulatory Visit | Attending: Family Medicine | Admitting: Family Medicine

## 2020-11-03 DIAGNOSIS — M25551 Pain in right hip: Secondary | ICD-10-CM | POA: Diagnosis not present

## 2020-11-03 DIAGNOSIS — M25552 Pain in left hip: Secondary | ICD-10-CM | POA: Diagnosis not present

## 2020-11-03 DIAGNOSIS — Z1231 Encounter for screening mammogram for malignant neoplasm of breast: Secondary | ICD-10-CM

## 2020-11-03 DIAGNOSIS — M25562 Pain in left knee: Secondary | ICD-10-CM | POA: Diagnosis not present

## 2020-11-03 DIAGNOSIS — M545 Low back pain, unspecified: Secondary | ICD-10-CM | POA: Diagnosis not present

## 2020-11-08 DIAGNOSIS — M25551 Pain in right hip: Secondary | ICD-10-CM | POA: Diagnosis not present

## 2020-11-08 DIAGNOSIS — M25562 Pain in left knee: Secondary | ICD-10-CM | POA: Diagnosis not present

## 2020-11-08 DIAGNOSIS — M545 Low back pain, unspecified: Secondary | ICD-10-CM | POA: Diagnosis not present

## 2020-11-08 DIAGNOSIS — M25552 Pain in left hip: Secondary | ICD-10-CM | POA: Diagnosis not present

## 2020-11-16 DIAGNOSIS — M25551 Pain in right hip: Secondary | ICD-10-CM | POA: Diagnosis not present

## 2020-11-16 DIAGNOSIS — M545 Low back pain, unspecified: Secondary | ICD-10-CM | POA: Diagnosis not present

## 2020-11-16 DIAGNOSIS — M25562 Pain in left knee: Secondary | ICD-10-CM | POA: Diagnosis not present

## 2020-11-16 DIAGNOSIS — M25552 Pain in left hip: Secondary | ICD-10-CM | POA: Diagnosis not present

## 2020-12-09 DIAGNOSIS — M25551 Pain in right hip: Secondary | ICD-10-CM | POA: Diagnosis not present

## 2020-12-09 DIAGNOSIS — M545 Low back pain, unspecified: Secondary | ICD-10-CM | POA: Diagnosis not present

## 2020-12-09 DIAGNOSIS — M25552 Pain in left hip: Secondary | ICD-10-CM | POA: Diagnosis not present

## 2020-12-09 DIAGNOSIS — M25562 Pain in left knee: Secondary | ICD-10-CM | POA: Diagnosis not present

## 2020-12-17 ENCOUNTER — Ambulatory Visit (INDEPENDENT_AMBULATORY_CARE_PROVIDER_SITE_OTHER): Payer: Medicare Other | Admitting: Family Medicine

## 2020-12-17 ENCOUNTER — Encounter: Payer: Self-pay | Admitting: Family Medicine

## 2020-12-17 ENCOUNTER — Other Ambulatory Visit: Payer: Self-pay

## 2020-12-17 DIAGNOSIS — G8929 Other chronic pain: Secondary | ICD-10-CM

## 2020-12-17 DIAGNOSIS — M25562 Pain in left knee: Secondary | ICD-10-CM | POA: Diagnosis not present

## 2020-12-17 NOTE — Progress Notes (Signed)
Office Visit Note   Patient: Allison Cox           Date of Birth: 1946/05/10           MRN: [id number] Visit Date: 12/17/2020 Requested by: Carollee Herter, [place], [place] [address] [place],  [place] [address] PCP: Carollee Herter, Alferd Apa, DO  Subjective: Chief Complaint  Patient presents with   Lower Back - Pain, Follow-up    PT has helped her back pain.   Left Knee - Pain    Something "funky" is still going on with the knee. Feels like the patella is slipping in or out of place with stepping down sometimes. Aches all the time. The lateral knee pain is not as bad as before, but the patella issue is getting worse.     HPI: She is here for follow-up low back and left knee pain.  Since last visit she has been doing physical therapy and has had excellent results regarding her back pain.  For the most part her pain has resolved and she is doing home exercises for maintenance.  Unfortunately, her left knee has not improved.  It feels like it is going to give way when going downstairs.  Pain seems to be most pronounced on the anterior aspect but she has a sensitive area on the lateral aspect as well.  No locking.  Incidentally, 15 years ago she had right knee arthroscopy and she has done very well with that.                ROS:   All other systems were reviewed and are negative.  Objective: Vital Signs: There were no vitals taken for this visit.  Physical Exam:  General:  Alert and oriented, in no acute distress. Pulm:  Breathing unlabored. Psy:  Normal mood, congruent affect. Skin: No erythema Left knee: No effusion today.  2-3+ patellofemoral crepitus.  Ligaments feel stable.  She is tender over the lateral joint line.  No palpable click with McMurray's.    Imaging: No results found.  Assessment & Plan: Worsening left knee pain, possibly due to patellofemoral articular cartilage defect.  Cannot rule out meniscus injury. -Discussed options with her and  elected to proceed with MRI scan.  Depending on results, could contemplate referral for arthroscopic debridement, cortisone injection or gel injections.     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   LOW BACK PAIN, CHRONIC 08/11/2009   COLONIC POLYPS, HYPERPLASTIC, HX OF 08/17/2008   Acute upper respiratory infection 04/17/2008   SINUSITIS- ACUTE-NOS 03/30/2008   Hyperlipidemia 09/26/2007   Anxiety state 01/01/2007   CYST, SEBACEOUS 01/01/2007   OSTEOPENIA 01/01/2007   ARTHROSCOPY, RIGHT KNEE, HX OF 01/01/2007   Past Medical History:  Diagnosis Date   Anxiety    Arthritis    knee   Colon polyps    hyperplastic and Adenomatous   Hyperlipidemia    Osteopenia     Family History  Problem Relation Age of Onset   Coronary artery disease Other    Stroke Mother    Heart attack Father  Colon cancer Neg Hx    Rectal cancer Neg Hx    Stomach cancer Neg Hx     Past Surgical History:  Procedure Laterality Date   CARPAL TUNNEL RELEASE Left    COLONOSCOPY  07/2012   Henrene Pastor - polyps   KNEE ARTHROSCOPY Right    WISDOM TOOTH EXTRACTION     Social History   Occupational History   Not on file  Tobacco Use   Smoking status: Former    Types: Cigarettes    Quit date: 05/15/1986    Years since quitting: 34.6   Smokeless tobacco: Never  Vaping Use   Vaping Use: Never used  Substance and Sexual Activity   Alcohol use: Yes    Alcohol/week: 3.0 - 4.0 standard drinks    Types: 3 - 4 Glasses of wine per week   Drug use: No   Sexual activity: Yes    Partners: Male    Birth control/protection:  Post-menopausal

## 2021-01-03 ENCOUNTER — Ambulatory Visit
Admission: RE | Admit: 2021-01-03 | Discharge: 2021-01-03 | Disposition: A | Discharge status: Home / Self Care | Payer: Medicare Other | Source: Ambulatory Visit | Attending: Family Medicine | Admitting: Family Medicine

## 2021-01-03 DIAGNOSIS — M25562 Pain in left knee: Secondary | ICD-10-CM

## 2021-01-03 DIAGNOSIS — G8929 Other chronic pain: Secondary | ICD-10-CM

## 2021-01-03 DIAGNOSIS — S83242A Other tear of medial meniscus, current injury, left knee, initial encounter: Secondary | ICD-10-CM | POA: Diagnosis not present

## 2021-01-03 DIAGNOSIS — M1712 Unilateral primary osteoarthritis, left knee: Secondary | ICD-10-CM | POA: Diagnosis not present

## 2021-01-05 ENCOUNTER — Telehealth: Payer: Self-pay | Admitting: Family Medicine

## 2021-01-05 NOTE — Telephone Encounter (Signed)
MRI shows arthritis as well as a tear of the medial meniscus cartilage.  Either 1 could be causing pain.  Could consider a cortisone injection.  Or if you would prefer, consultation with a surgeon for possible arthroscopic surgery.

## 2021-01-05 NOTE — Telephone Encounter (Signed)
FYI Appt made with Lurena Joiner on the 2nd she didn't want to wait till the 12th to see Dr. Marlou Sa

## 2021-01-14 ENCOUNTER — Ambulatory Visit (INDEPENDENT_AMBULATORY_CARE_PROVIDER_SITE_OTHER): Payer: Medicare Other | Admitting: Surgical

## 2021-01-14 ENCOUNTER — Other Ambulatory Visit: Payer: Self-pay

## 2021-01-14 ENCOUNTER — Other Ambulatory Visit: Payer: Self-pay | Admitting: Family Medicine

## 2021-01-14 DIAGNOSIS — M1712 Unilateral primary osteoarthritis, left knee: Secondary | ICD-10-CM | POA: Diagnosis not present

## 2021-01-14 DIAGNOSIS — E782 Mixed hyperlipidemia: Secondary | ICD-10-CM

## 2021-01-15 ENCOUNTER — Encounter: Payer: Self-pay | Admitting: Surgical

## 2021-01-15 MED ORDER — BUPIVACAINE HCL 0.25 % IJ SOLN
4.0000 mL | INTRAMUSCULAR | Status: AC | PRN
  Administered 2021-01-14: 4 mL via INTRA_ARTICULAR

## 2021-01-15 MED ORDER — LIDOCAINE HCL 1 % IJ SOLN
5.0000 mL | INTRAMUSCULAR | Status: AC | PRN
  Administered 2021-01-14: 5 mL

## 2021-01-15 MED ORDER — METHYLPREDNISOLONE ACETATE 40 MG/ML IJ SUSP
40.0000 mg | INTRAMUSCULAR | Status: AC | PRN
  Administered 2021-01-14: 40 mg via INTRA_ARTICULAR

## 2021-01-15 NOTE — Progress Notes (Signed)
Office Visit Note   Patient: Allison Cox           Date of Birth: June 13, 1945           MRN: [id number] Visit Date: 01/14/2021 Requested by: [address] [place], [place] [address] [place],  [place] [address] PCP: Carollee Herter, Alferd Apa, DO  Subjective: Chief Complaint  Patient presents with   Left Knee - Pain    HPI: Allison Cox is a 75 y.o. female who presents to the office for MRI review on referral from Dr. Junius Roads. Patient denies any changes in symptoms since seeing him.  Continues to complain mainly of left knee pain that she localizes to the lateral aspect of the knee.  She complains that the knee feels like it gives way on her.  Denies any groin pain or radicular pain.  She is not really taking any medication for her pain aside from the occasional Aleve or Tylenol.  She has no history of left knee surgery but did have a right knee arthroscopy with meniscal debridement about 15 years ago that helped with her right knee pain.  She denies any medial sided left knee pain.  MRI results revealed: MR Knee Left w/o contrast  Result Date: 01/05/2021 CLINICAL DATA:  Chronic knee pain. Pain for 4-6 months EXAM: MRI OF THE LEFT KNEE WITHOUT CONTRAST TECHNIQUE: Multiplanar, multisequence MR imaging of the knee was performed. No intravenous contrast was administered. COMPARISON:  None. FINDINGS: MENISCI Medial: Oblique tear of the posterior horn of the medial meniscus extending to the inferior articular surface. Degeneration of the medial meniscus. Peripheral meniscal extrusion. Lateral: Degeneration of the root of the posterior horn of the lateral meniscus. No discrete tear. LIGAMENTS Cruciates: ACL and PCL are intact. Collaterals: Medial collateral ligament is intact. Lateral collateral ligament complex is intact. CARTILAGE Patellofemoral: Extensive full-thickness cartilage loss of the patellofemoral compartment subchondral reactive marrow edema. Medial: Partial-thickness  cartilage loss of the medial femorotibial compartment. Lateral: Mild partial-thickness cartilage loss of the lateral femorotibial compartment. JOINT: No joint effusion. Normal Hoffa's fat-pad. No plical thickening. POPLITEAL FOSSA: Popliteus tendon is intact. No Baker's cyst. EXTENSOR MECHANISM: Intact quadriceps tendon. Intact patellar tendon. Intact lateral patellar retinaculum. Intact medial patellar retinaculum. Intact MPFL. BONES: No aggressive osseous lesion. No fracture or dislocation. Other: No fluid collection or hematoma. Muscles are normal. Small amount of fluid in the pes Bursa. IMPRESSION: 1. Oblique tear of the posterior horn of the medial meniscus extending to the inferior articular surface. Degeneration of the medial meniscus. Peripheral meniscal extrusion. 2. Degeneration of the root of the posterior horn of the lateral meniscus. 3. Tricompartmental cartilage abnormalities as described above most severe in the patellofemoral compartment. Electronically Signed   By: Kathreen Devoid M.D.   On: 01/05/2021 06:26                 ROS: All systems reviewed are negative as they relate to the chief complaint within the history of present illness.  Patient denies fevers or chills.  Assessment & Plan: Visit Diagnoses:  1. Unilateral primary osteoarthritis, left knee     Plan: Allison Cox is a 75 y.o. female who presents to the office for review of left knee MRI and discussion of options on referral from Dr. Junius Roads.  She has recent MRI scan that was reviewed today in the office that revealed partial-thickness cartilage loss of the medial and lateral compartments as well as full-thickness cartilage loss of  the patellofemoral joint with subchondral marrow edema.  Additionally, oblique tear of the medial meniscus with meniscal extrusion was noted but she is not really having any medial symptoms throughout her daily life.  Most of her pain is lateral and given the degree of arthritis in her left knee,  arthroscopy with debridement is not a great option for her and not likely to yield any lasting benefit.  After discussion of options, she would like to try left knee injection which she has not had recently.  She tolerated the procedure well and plan for her to follow-up with Dr. Marlou Sa in 6 weeks for clinical recheck.    Follow-Up Instructions: No follow-ups on file.   Orders:  No orders of the defined types were placed in this encounter.  No orders of the defined types were placed in this encounter.     Procedures: Large Joint Inj: L knee on 01/14/2021 7:25 PM Indications: diagnostic evaluation, joint swelling and pain Details: 18 G 1.5 in needle, superolateral approach  Arthrogram: No  Medications: 5 mL lidocaine 1 %; 40 mg methylPREDNISolone acetate 40 MG/ML; 4 mL bupivacaine 0.25 % Outcome: tolerated well, no immediate complications Procedure, treatment alternatives, risks and benefits explained, specific risks discussed. Consent was given by the patient. Immediately prior to procedure a time out was called to verify the correct patient, procedure, equipment, support staff and site/side marked as required. Patient was prepped and draped in the usual sterile fashion.      Clinical Data: No additional findings.  Objective: Vital Signs: There were no vitals taken for this visit.  Physical Exam:  Constitutional: Patient appears well-developed HEENT:  Head: Normocephalic Eyes:EOM are normal Neck: Normal range of motion Cardiovascular: Normal rate Pulmonary/chest: Effort normal Neurologic: Patient is alert Skin: Skin is warm Psychiatric: Patient has normal mood and affect  Ortho Exam: Ortho exam demonstrates left knee with no effusion.  Tenderness over the medial and lateral joint lines, primarily over the lateral joint line.  Able to perform straight leg raise with excellent quad strength.  Patellar crepitus noted with passive flexion and extension of the left knee.  No calf  tenderness.  Negative Homans' sign.  No pain with hip range of motion.  Specialty Comments:  No specialty comments available.  Imaging: No results found.   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   LOW BACK PAIN, CHRONIC 08/11/2009   COLONIC POLYPS, HYPERPLASTIC, HX OF 08/17/2008   Acute upper respiratory infection 04/17/2008   SINUSITIS- ACUTE-NOS 03/30/2008   Hyperlipidemia 09/26/2007   Anxiety state 01/01/2007   CYST, SEBACEOUS 01/01/2007   OSTEOPENIA 01/01/2007   ARTHROSCOPY, RIGHT KNEE, HX OF 01/01/2007   Past Medical History:  Diagnosis Date   Anxiety    Arthritis    knee   Colon polyps    hyperplastic and Adenomatous   Hyperlipidemia    Osteopenia     Family History  Problem Relation Age of Onset   Coronary artery disease Other    Stroke Mother    Heart attack Father    Colon cancer Neg Hx    Rectal cancer Neg Hx    Stomach cancer Neg Hx     Past  Surgical History:  Procedure Laterality Date   CARPAL TUNNEL RELEASE Left    COLONOSCOPY  07/2012   Henrene Pastor - polyps   KNEE ARTHROSCOPY Right    WISDOM TOOTH EXTRACTION     Social History   Occupational History   Not on file  Tobacco Use   Smoking status: Former    Types: Cigarettes    Quit date: 05/15/1986    Years since quitting: 34.6   Smokeless tobacco: Never  Vaping Use   Vaping Use: Never used  Substance and Sexual Activity   Alcohol use: Yes    Alcohol/week: 3.0 - 4.0 standard drinks    Types: 3 - 4 Glasses of wine per week   Drug use: No   Sexual activity: Yes    Partners: Male    Birth control/protection: Post-menopausal

## 2021-02-25 ENCOUNTER — Encounter: Payer: Self-pay | Admitting: Surgical

## 2021-02-25 ENCOUNTER — Other Ambulatory Visit: Payer: Self-pay

## 2021-02-25 ENCOUNTER — Ambulatory Visit: Payer: Medicare Other | Admitting: Surgical

## 2021-02-25 DIAGNOSIS — M1712 Unilateral primary osteoarthritis, left knee: Secondary | ICD-10-CM | POA: Diagnosis not present

## 2021-02-27 ENCOUNTER — Encounter: Payer: Self-pay | Admitting: Surgical

## 2021-02-27 NOTE — Progress Notes (Signed)
Office Visit Note   Patient: Allison Cox           Date of Birth: 1946-04-24           MRN: [id number] Visit Date: 02/25/2021 Requested by: [address], [place], [place] [address] [place],  [place] [address] PCP: Carollee Herter, Alferd Apa, DO  Subjective: Chief Complaint  Patient presents with   Left Knee - Follow-up    HPI: Allison Cox is a 75 y.o. female who returns to the office for follow-up visit.  Plan from last visit was to review MRI scan and try cortisone injection..  Patient now returns with significant, 100% improvement compared with prior appointment.  Reports 0/10 pain.  She has not taken any medications for pain.  She has been able to return to a very active lifestyle and is planning on going to a festival over the weekend.  She is very pleased with the result from the injection.  She denies any significant recurrence of pain, new symptoms..                 ROS: All systems reviewed are negative as they relate to the chief complaint within the history of present illness.  Patient denies fevers or chills.  Assessment & Plan: Visit Diagnoses:  1. Unilateral primary osteoarthritis, left knee     Plan: Allison Cox is a 75 y.o. female who returns to the office for follow-up visit for left knee pain..  Plan from last visit was left knee injection of cortisone administered on 01/14/2021.  They now return with 100%, lasting improvement from injection.  She is very pleased with her result and happy that she did not have to undergo arthroscopy.  As we discussed at her last appointment, arthroscopy is not a predictable option for her.  Discussed the long-term history of knee arthritis once more and she understands that eventually this injection will likely wear off and she may require serial injections.  She has not had gel injection and we could consider doing this in the future.  For now, plan to stick with cortisone injections as this has really improved her  symptoms.  She will follow-up with the office as needed.  Follow-Up Instructions: No follow-ups on file.   Orders:  No orders of the defined types were placed in this encounter.  No orders of the defined types were placed in this encounter.     Procedures: No procedures performed   Clinical Data: No additional findings.  Objective: Vital Signs: There were no vitals taken for this visit.  Physical Exam:  Constitutional: Patient appears well-developed HEENT:  Head: Normocephalic Eyes:EOM are normal Neck: Normal range of motion Cardiovascular: Normal rate Pulmonary/chest: Effort normal Neurologic: Patient is alert Skin: Skin is warm Psychiatric: Patient has normal mood and affect  Ortho Exam: Ortho exam demonstrates left knee without effusion.  Mild tenderness over the medial joint line.  No tenderness over the lateral joint line.  Able to perform straight leg raise.  0 degrees extension and 115 degrees of knee flexion.  Patellar crepitus noted.  No pain with hip range of motion.  Specialty Comments:  No specialty comments available.  Imaging: No results found.   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 08/11/2009   LOW BACK PAIN, CHRONIC 08/11/2009   COLONIC POLYPS, HYPERPLASTIC, HX OF 08/17/2008   Acute upper respiratory infection 04/17/2008   SINUSITIS- ACUTE-NOS 03/30/2008   Hyperlipidemia 09/26/2007   Anxiety state 01/01/2007   CYST, SEBACEOUS 01/01/2007   OSTEOPENIA 01/01/2007   ARTHROSCOPY, RIGHT KNEE, HX OF 01/01/2007   Past Medical History:  Diagnosis Date   Anxiety    Arthritis    knee   Colon polyps    hyperplastic and  Adenomatous   Hyperlipidemia    Osteopenia     Family History  Problem Relation Age of Onset   Coronary artery disease Other    Stroke Mother    Heart attack Father    Colon cancer Neg Hx    Rectal cancer Neg Hx    Stomach cancer Neg Hx     Past Surgical History:  Procedure Laterality Date   CARPAL TUNNEL RELEASE Left    COLONOSCOPY  07/2012   Henrene Pastor - polyps   KNEE ARTHROSCOPY Right    WISDOM TOOTH EXTRACTION     Social History   Occupational History   Not on file  Tobacco Use   Smoking status: Former    Types: Cigarettes    Quit date: 05/15/1986    Years since quitting: 34.8   Smokeless tobacco: Never  Vaping Use   Vaping Use: Never used  Substance and Sexual Activity   Alcohol use: Yes    Alcohol/week: 3.0 - 4.0 standard drinks    Types: 3 - 4 Glasses of wine per week   Drug use: No   Sexual activity: Yes    Partners: Male    Birth control/protection: Post-menopausal

## 2021-04-13 ENCOUNTER — Ambulatory Visit
Admission: RE | Admit: 2021-04-13 | Discharge: 2021-04-13 | Disposition: A | Discharge status: Home / Self Care | Payer: Medicare Other | Source: Ambulatory Visit | Attending: Family Medicine | Admitting: Family Medicine

## 2021-04-13 ENCOUNTER — Other Ambulatory Visit: Payer: Self-pay

## 2021-04-13 DIAGNOSIS — M8588 Other specified disorders of bone density and structure, other site: Secondary | ICD-10-CM | POA: Diagnosis not present

## 2021-04-13 DIAGNOSIS — E2839 Other primary ovarian failure: Secondary | ICD-10-CM

## 2021-04-13 DIAGNOSIS — Z780 Asymptomatic menopausal state: Secondary | ICD-10-CM | POA: Diagnosis not present

## 2021-04-14 ENCOUNTER — Other Ambulatory Visit: Payer: Self-pay | Admitting: Family Medicine

## 2021-04-14 DIAGNOSIS — E782 Mixed hyperlipidemia: Secondary | ICD-10-CM

## 2021-04-14 DIAGNOSIS — F411 Generalized anxiety disorder: Secondary | ICD-10-CM

## 2021-04-18 ENCOUNTER — Other Ambulatory Visit (HOSPITAL_BASED_OUTPATIENT_CLINIC_OR_DEPARTMENT_OTHER): Payer: Self-pay

## 2021-04-18 ENCOUNTER — Encounter: Payer: Self-pay | Admitting: Family Medicine

## 2021-04-18 ENCOUNTER — Ambulatory Visit (INDEPENDENT_AMBULATORY_CARE_PROVIDER_SITE_OTHER): Payer: Medicare Other | Admitting: Family Medicine

## 2021-04-18 ENCOUNTER — Ambulatory Visit: Payer: Medicare Other | Attending: Internal Medicine

## 2021-04-18 DIAGNOSIS — E782 Mixed hyperlipidemia: Secondary | ICD-10-CM

## 2021-04-18 DIAGNOSIS — F411 Generalized anxiety disorder: Secondary | ICD-10-CM

## 2021-04-18 DIAGNOSIS — Z23 Encounter for immunization: Secondary | ICD-10-CM

## 2021-04-18 DIAGNOSIS — Z0000 Encounter for general adult medical examination without abnormal findings: Secondary | ICD-10-CM

## 2021-04-18 LAB — CBC WITH DIFFERENTIAL/PLATELET
Basophils Absolute: 0 10*3/uL (ref 0.0–0.1)
Basophils Relative: 0.6 % (ref 0.0–3.0)
Eosinophils Absolute: 0.1 10*3/uL (ref 0.0–0.7)
Eosinophils Relative: 1.9 % (ref 0.0–5.0)
HCT: 38.3 % (ref 36.0–46.0)
Hemoglobin: 12.9 g/dL (ref 12.0–15.0)
Lymphocytes Relative: 13.4 % (ref 12.0–46.0)
Lymphs Abs: 0.7 10*3/uL (ref 0.7–4.0)
MCHC: 33.6 g/dL (ref 30.0–36.0)
MCV: 89.5 fl (ref 78.0–100.0)
Monocytes Absolute: 0.4 10*3/uL (ref 0.1–1.0)
Monocytes Relative: 7.5 % (ref 3.0–12.0)
Neutro Abs: 4 10*3/uL (ref 1.4–7.7)
Neutrophils Relative %: 76.6 % (ref 43.0–77.0)
Platelets: 213 10*3/uL (ref 150.0–400.0)
RBC: 4.28 Mil/uL (ref 3.87–5.11)
RDW: 14.2 % (ref 11.5–15.5)
WBC: 5.2 10*3/uL (ref 4.0–10.5)

## 2021-04-18 LAB — COMPREHENSIVE METABOLIC PANEL
ALT: 17 U/L (ref 0–35)
AST: 16 U/L (ref 0–37)
Albumin: 4.5 g/dL (ref 3.5–5.2)
Alkaline Phosphatase: 62 U/L (ref 39–117)
BUN: 12 mg/dL (ref 6–23)
CO2: 32 mEq/L (ref 19–32)
Calcium: 9.4 mg/dL (ref 8.4–10.5)
Chloride: 102 mEq/L (ref 96–112)
Creatinine, Ser: 0.73 mg/dL (ref 0.40–1.20)
GFR: 81.2 mL/min (ref >60.00)
Glucose, Bld: 96 mg/dL (ref 70–99)
Potassium: 4.4 mEq/L (ref 3.5–5.1)
Sodium: 139 mEq/L (ref 135–145)
Total Bilirubin: 0.5 mg/dL (ref 0.2–1.2)
Total Protein: 6.8 g/dL (ref 6.0–8.3)

## 2021-04-18 LAB — LIPID PANEL
Cholesterol: 195 mg/dL (ref 0–200)
HDL: 64.1 mg/dL (ref >39.00)
LDL Cholesterol: 105 mg/dL — ABNORMAL HIGH (ref 0–99)
NonHDL: 131.33
Total CHOL/HDL Ratio: 3
Triglycerides: 130 mg/dL (ref 0.0–149.0)
VLDL: 26 mg/dL (ref 0.0–40.0)

## 2021-04-18 MED ORDER — PFIZER COVID-19 VAC BIVALENT 30 MCG/0.3ML IM SUSP
INTRAMUSCULAR | 0 refills | Status: DC
Start: 2021-04-18 — End: 2021-07-05
  Filled 2021-04-18: qty 0.3, 1d supply, fill #0

## 2021-04-18 MED ORDER — SERTRALINE HCL 100 MG PO TABS: ORAL_TABLET | ORAL | 0 refills | Status: DC

## 2021-04-18 MED ORDER — LORAZEPAM 0.5 MG PO TABS: 0.5000 mg | ORAL_TABLET | Freq: Three times a day (TID) | ORAL | 1 refills | Status: DC | PRN

## 2021-04-18 MED ORDER — ATORVASTATIN CALCIUM 20 MG PO TABS: 20.0000 mg | ORAL_TABLET | Freq: Every day | ORAL | 1 refills | Status: DC

## 2021-04-18 NOTE — Progress Notes (Signed)
   Covid-19 Vaccination Clinic  Name:  Allison Cox    MRN: [id number] DOB: 06/18/1945  04/18/2021  Ms. Filippone was observed post Covid-19 immunization for 15 minutes without incident. She was provided with Vaccine Information Sheet and instruction to access the V-Safe system.   Ms. Weatherly was instructed to call 911 with any severe reactions post vaccine: Difficulty breathing  Swelling of face and throat  A fast heartbeat  A bad rash all over body  Dizziness and weakness   Immunizations Administered     Name Date Dose VIS Date Route   Pfizer Covid-19 Vaccine Bivalent Booster 04/18/2021  9:48 AM 0.3 mL 01/12/2021 Intramuscular   Manufacturer: [employer]   Lot: [id number]   [place]: [phone]

## 2021-04-18 NOTE — Progress Notes (Signed)
Established Patient Office Visit  Subjective:  Patient ID: Allison Cox, female    DOB: 10-21-1945  Age: 75 y.o. MRN: [id number]  CC:  Chief Complaint  Patient presents with   Hyperlipidemia   Follow-up    HPI Allison Cox presents for f/u cholesterol  She is seeing ortho and pt for her knee pain   Past Medical History:  Diagnosis Date   Anxiety    Arthritis    knee   Colon polyps    hyperplastic and Adenomatous   Hyperlipidemia    Osteopenia     Past Surgical History:  Procedure Laterality Date   CARPAL TUNNEL RELEASE Left    COLONOSCOPY  07/2012   Henrene Pastor - polyps   KNEE ARTHROSCOPY Right    WISDOM TOOTH EXTRACTION      Family History  Problem Relation Age of Onset   Coronary artery disease Other    Stroke Mother    Heart attack Father    Colon cancer Neg Hx    Rectal cancer Neg Hx    Stomach cancer Neg Hx     Social History   Socioeconomic History   Marital status: Single    Spouse name: Not on file   Number of children: Not on file   Years of education: Not on file   Highest education level: Not on file  Occupational History   Not on file  Tobacco Use   Smoking status: Former    Types: Cigarettes    Quit date: 05/15/1986    Years since quitting: 34.9   Smokeless tobacco: Never  Vaping Use   Vaping Use: Never used  Substance and Sexual Activity   Alcohol use: Yes    Alcohol/week: 3.0 - 4.0 standard drinks    Types: 3 - 4 Glasses of wine per week   Drug use: No   Sexual activity: Yes    Partners: Male    Birth control/protection: Post-menopausal  Other Topics Concern   Not on file  Social History Narrative   Not on file   Social Determinants of Health   Financial Resource Strain: Not on file  Food Insecurity: Not on file  Transportation Needs: Not on file  Physical Activity: Not on file  Stress: Not on file  Social Connections: Not on file  Intimate Partner Violence: Not on file    Outpatient Medications Prior to Visit   Medication Sig Dispense Refill   atorvastatin (LIPITOR) 20 MG tablet Take 1 tablet by mouth once daily 90 tablet 0   LORazepam (ATIVAN) 0.5 MG tablet Take 1 tablet (0.5 mg total) by mouth every 8 (eight) hours as needed for anxiety. 90 tablet 1   sertraline (ZOLOFT) 100 MG tablet TAKE 1 & 1/2 (ONE & ONE-HALF) TABLETS BY MOUTH ONCE DAILY 135 tablet 0   Facility-Administered Medications Prior to Visit  Medication Dose Route Frequency Provider Last Rate Last Admin   0.9 %  sodium chloride infusion  500 mL Intravenous Once Irene Shipper, MD        Allergies  Allergen Reactions   Codeine Nausea And Vomiting    ROS Review of Systems  Constitutional:  Negative for appetite change, diaphoresis, fatigue and unexpected weight change.  Eyes:  Negative for pain, redness and visual disturbance.  Respiratory:  Negative for cough, chest tightness, shortness of breath and wheezing.   Cardiovascular:  Negative for chest pain, palpitations and leg swelling.  Endocrine: Negative for cold intolerance, heat intolerance, polydipsia, polyphagia and polyuria.  Genitourinary:  Negative for difficulty urinating, dysuria and frequency.  Neurological:  Negative for dizziness, light-headedness, numbness and headaches.  Psychiatric/Behavioral:  Negative for decreased concentration, dysphoric mood, self-injury, sleep disturbance and suicidal ideas. The patient is not nervous/anxious.      Objective:    Physical Exam Vitals and nursing note reviewed.  Constitutional:      Appearance: She is well-developed.  HENT:     Head: Normocephalic and atraumatic.  Eyes:     Conjunctiva/sclera: Conjunctivae normal.  Neck:     Thyroid: No thyromegaly.     Vascular: No carotid bruit or JVD.  Cardiovascular:     Rate and Rhythm: Normal rate and regular rhythm.     Heart sounds: Normal heart sounds. No murmur heard. Pulmonary:     Effort: Pulmonary effort is normal. No respiratory distress.     Breath sounds: Normal  breath sounds. No wheezing or rales.  Chest:     Chest wall: No tenderness.  Musculoskeletal:     Cervical back: Normal range of motion and neck supple.  Neurological:     Mental Status: She is alert and oriented to person, place, and time.  Psychiatric:        Mood and Affect: Mood normal.        Behavior: Behavior normal.        Thought Content: Thought content normal.        Judgment: Judgment normal.    BP 128/86 (BP Location: Left Arm, Patient Position: Sitting, Cuff Size: Large)   Pulse (!) 57   Temp 97.8 F (36.6 C) (Oral)   Resp 18   Ht 5\' 7"  (1.702 m)   Wt 244 lb 12.8 oz (111 kg)   SpO2 96%   BMI 38.34 kg/m  Wt Readings from Last 3 Encounters:  04/18/21 244 lb 12.8 oz (111 kg)  10/21/20 238 lb 9.6 oz (108.2 kg)  04/27/20 245 lb (111.1 kg)     Health Maintenance Due  Topic Date Due   Zoster Vaccines- Shingrix (1 of 2) Never done   TETANUS/TDAP  06/28/2013   COVID-19 Vaccine (5 - Booster for Pfizer series) 11/15/2020    There are no preventive care reminders to display for this patient.  Lab Results  Component Value Date   TSH 2.68 08/11/2009   Lab Results  Component Value Date   WBC 6.1 10/21/2020   HGB 14.0 10/21/2020   HCT 41.0 10/21/2020   MCV 88.3 10/21/2020   PLT 226.0 10/21/2020   Lab Results  Component Value Date   NA 139 10/21/2020   K 4.4 10/21/2020   CO2 31 10/21/2020   GLUCOSE 109 (H) 10/21/2020   BUN 21 10/21/2020   CREATININE 0.78 10/21/2020   BILITOT 0.5 10/21/2020   ALKPHOS 69 10/21/2020   AST 14 10/21/2020   ALT 16 10/21/2020   PROT 7.4 10/21/2020   ALBUMIN 4.7 10/21/2020   CALCIUM 9.6 10/21/2020   GFR 75.25 10/21/2020   Lab Results  Component Value Date   CHOL 191 10/21/2020   Lab Results  Component Value Date   HDL 63.70 10/21/2020   Lab Results  Component Value Date   LDLCALC 104 (H) 10/21/2020   Lab Results  Component Value Date   TRIG 119.0 10/21/2020   Lab Results  Component Value Date   CHOLHDL 3  10/21/2020   No results found for: HGBA1C    Assessment & Plan:   Problem List Items Addressed This Visit  Unprioritized   Generalized anxiety disorder   Relevant Medications   LORazepam (ATIVAN) 0.5 MG tablet   sertraline (ZOLOFT) 100 MG tablet   Preventative health care   Relevant Medications   LORazepam (ATIVAN) 0.5 MG tablet   Anxiety state    Stable  con't zoloft / xanax       Relevant Medications   LORazepam (ATIVAN) 0.5 MG tablet   sertraline (ZOLOFT) 100 MG tablet   Hyperlipidemia    Tolerating statin, encouraged heart healthy diet, avoid trans fats, minimize simple carbs and saturated fats. Increase exercise as tolerated      Relevant Medications   atorvastatin (LIPITOR) 20 MG tablet   Other Relevant Orders   Comprehensive metabolic panel   Lipid panel   CBC with Differential/Platelet    Meds ordered this encounter  Medications   atorvastatin (LIPITOR) 20 MG tablet    Sig: Take 1 tablet (20 mg total) by mouth daily.    Dispense:  90 tablet    Refill:  1   LORazepam (ATIVAN) 0.5 MG tablet    Sig: Take 1 tablet (0.5 mg total) by mouth every 8 (eight) hours as needed for anxiety.    Dispense:  90 tablet    Refill:  1    Not to exceed 5 additional fills before 06/01/2019   sertraline (ZOLOFT) 100 MG tablet    Sig: TAKE 1 & 1/2 (ONE & ONE-HALF) TABLETS BY MOUTH ONCE DAILY    Dispense:  135 tablet    Refill:  0    Follow-up: No follow-ups on file.    [name], DO

## 2021-04-18 NOTE — Assessment & Plan Note (Signed)
Stable  con't zoloft / xanax

## 2021-04-18 NOTE — Assessment & Plan Note (Signed)
Tolerating statin, encouraged heart healthy diet, avoid trans fats, minimize simple carbs and saturated fats. Increase exercise as tolerated 

## 2021-04-18 NOTE — Patient Instructions (Signed)
Cholesterol Content in Foods ?Cholesterol is a waxy, fat-like substance that helps to carry fat in the blood. The body needs cholesterol in small amounts, but too much cholesterol can cause damage to the arteries and heart. ?What foods have cholesterol? ?Cholesterol is found in animal-based foods, such as meat, seafood, and dairy. Generally, low-fat dairy and lean meats have less cholesterol than full-fat dairy and fatty meats. The milligrams of cholesterol per serving (mg per serving) of common cholesterol-containing foods are listed below. ?Meats and other proteins ?Egg -- one large whole egg has 186 mg. ?Veal shank -- 4 oz (113 g) has 141 mg. ?Lean ground turkey (93% lean) -- 4 oz (113 g) has 118 mg. ?Fat-trimmed lamb loin -- 4 oz (113 g) has 106 mg. ?Lean ground beef (90% lean) -- 4 oz (113 g) has 100 mg. ?Lobster -- 3.5 oz (99 g) has 90 mg. ?Pork loin chops -- 4 oz (113 g) has 86 mg. ?Canned salmon -- 3.5 oz (99 g) has 83 mg. ?Fat-trimmed beef top loin -- 4 oz (113 g) has 78 mg. ?Frankfurter -- 1 frank (3.5 oz or 99 g) has 77 mg. ?Crab -- 3.5 oz (99 g) has 71 mg. ?Roasted chicken without skin, white meat -- 4 oz (113 g) has 66 mg. ?Light bologna -- 2 oz (57 g) has 45 mg. ?Deli-cut turkey -- 2 oz (57 g) has 31 mg. ?Canned tuna -- 3.5 oz (99 g) has 31 mg. ?Bacon -- 1 oz (28 g) has 29 mg. ?Oysters and mussels (raw) -- 3.5 oz (99 g) has 25 mg. ?Mackerel -- 1 oz (28 g) has 22 mg. ?Trout -- 1 oz (28 g) has 20 mg. ?Pork sausage -- 1 link (1 oz or 28 g) has 17 mg. ?Salmon -- 1 oz (28 g) has 16 mg. ?Tilapia -- 1 oz (28 g) has 14 mg. ?Dairy ?Soft-serve ice cream -- ? cup (4 oz or 86 g) has 103 mg. ?Whole-milk yogurt -- 1 cup (8 oz or 245 g) has 29 mg. ?Cheddar cheese -- 1 oz (28 g) has 28 mg. ?American cheese -- 1 oz (28 g) has 28 mg. ?Whole milk -- 1 cup (8 oz or 250 mL) has 23 mg. ?2% milk -- 1 cup (8 oz or 250 mL) has 18 mg. ?Cream cheese -- 1 tablespoon (Tbsp) (14.5 g) has 15 mg. ?Cottage cheese -- ? cup (4 oz or 113  g) has 14 mg. ?Low-fat (1%) milk -- 1 cup (8 oz or 250 mL) has 10 mg. ?Sour cream -- 1 Tbsp (12 g) has 8.5 mg. ?Low-fat yogurt -- 1 cup (8 oz or 245 g) has 8 mg. ?Nonfat Greek yogurt -- 1 cup (8 oz or 228 g) has 7 mg. ?Half-and-half cream -- 1 Tbsp (15 mL) has 5 mg. ?Fats and oils ?Cod liver oil -- 1 tablespoon (Tbsp) (13.6 g) has 82 mg. ?Butter -- 1 Tbsp (14 g) has 15 mg. ?Lard -- 1 Tbsp (12.8 g) has 14 mg. ?Bacon grease -- 1 Tbsp (12.9 g) has 14 mg. ?Mayonnaise -- 1 Tbsp (13.8 g) has 5-10 mg. ?Margarine -- 1 Tbsp (14 g) has 3-10 mg. ?The items listed above may not be a complete list of foods with cholesterol. Exact amounts of cholesterol in these foods may vary depending on specific ingredients and brands. Contact a dietitian for more information. ?What foods do not have cholesterol? ?Most plant-based foods do not have cholesterol unless you combine them with a food that has cholesterol.   Foods without cholesterol include: ?Grains and cereals. ?Vegetables. ?Fruits. ?Vegetable oils, such as olive, canola, and sunflower oil. ?Legumes, such as peas, beans, and lentils. ?Nuts and seeds. ?Egg whites. ?The items listed above may not be a complete list of foods that do not have cholesterol. Contact a dietitian for more information. ?Summary ?The body needs cholesterol in small amounts, but too much cholesterol can cause damage to the arteries and heart. ?Cholesterol is found in animal-based foods, such as meat, seafood, and dairy. Generally, low-fat dairy and lean meats have less cholesterol than full-fat dairy and fatty meats. ?This information is not intended to replace advice given to you by your health care provider. Make sure you discuss any questions you have with your health care provider. ?Document Revised: 09/10/2020 Document Reviewed: 09/10/2020 ?Elsevier Patient Education ? 2022 Elsevier Inc. ? ?

## 2021-07-05 ENCOUNTER — Encounter: Payer: Self-pay | Admitting: Family Medicine

## 2021-07-05 ENCOUNTER — Ambulatory Visit (INDEPENDENT_AMBULATORY_CARE_PROVIDER_SITE_OTHER): Payer: Medicare Other | Admitting: Family Medicine

## 2021-07-05 VITALS — BP 130/60 | HR 74 | Temp 98.1°F | Ht 68.0 in | Wt 247.8 lb

## 2021-07-05 DIAGNOSIS — R5383 Other fatigue: Secondary | ICD-10-CM | POA: Insufficient documentation

## 2021-07-05 DIAGNOSIS — R109 Unspecified abdominal pain: Secondary | ICD-10-CM | POA: Diagnosis not present

## 2021-07-05 DIAGNOSIS — R6883 Chills (without fever): Secondary | ICD-10-CM | POA: Diagnosis not present

## 2021-07-05 LAB — POCT URINALYSIS DIP (MANUAL ENTRY)
Bilirubin, UA: NEGATIVE
Blood, UA: NEGATIVE
Glucose, UA: NEGATIVE mg/dL
Ketones, POC UA: NEGATIVE mg/dL
Nitrite, UA: NEGATIVE
Protein Ur, POC: NEGATIVE mg/dL
Spec Grav, UA: 1.015 (ref 1.010–1.025)
Urobilinogen, UA: 0.2 E.U./dL
pH, UA: 6 (ref 5.0–8.0)

## 2021-07-05 LAB — THYROID PANEL WITH TSH
Free Thyroxine Index: 1.5 (ref 1.4–3.8)
T3 Uptake: 29 % (ref 22–35)
T4, Total: 5.3 ug/dL (ref 5.1–11.9)
TSH: 3.62 mIU/L (ref 0.40–4.50)

## 2021-07-05 LAB — POC COVID19 BINAXNOW: SARS Coronavirus 2 Ag: NEGATIVE

## 2021-07-05 NOTE — Assessment & Plan Note (Signed)
Check labs  Urine culture pending

## 2021-07-05 NOTE — Patient Instructions (Signed)
Fatigue °If you have fatigue, you feel tired all the time and have a lack of energy or a lack of motivation. Fatigue may make it difficult to start or complete tasks because of exhaustion. In general, occasional or mild fatigue is often a normal response to activity or life. However, long-lasting (chronic) or extreme fatigue may be a symptom of a medical condition. °Follow these instructions at home: °General instructions °Watch your fatigue for any changes. °Go to bed and get up at the same time every day. °Avoid fatigue by pacing yourself during the day and getting enough sleep at night. °Maintain a healthy weight. °Medicines °Take over-the-counter and prescription medicines only as told by your health care provider. °Take a multivitamin, if told by your health care provider.  °Do not use herbal or dietary supplements unless they are approved by your health care provider. °Activity ° °Exercise regularly, as told by your health care provider. °Use or practice techniques to help you relax, such as yoga, tai chi, meditation, or massage therapy. °Eating and drinking ° °Avoid heavy meals in the evening. °Eat a well-balanced diet, which includes lean proteins, whole grains, plenty of fruits and vegetables, and low-fat dairy products. °Avoid consuming too much caffeine. °Avoid the use of alcohol. °Drink enough fluid to keep your urine pale yellow. °Lifestyle °Change situations that cause you stress. Try to keep your work and personal schedule in balance. °Do not use any products that contain nicotine or tobacco, such as cigarettes and e-cigarettes. If you need help quitting, ask your health care provider. °Do not use drugs. °Contact a health care provider if: °Your fatigue does not get better. °You have a fever. °You suddenly lose or gain weight. °You have headaches. °You have trouble falling asleep or sleeping through the night. °You feel angry, guilty, anxious, or sad. °You are unable to have a bowel movement  (constipation). °Your skin is dry. °You have swelling in your legs or another part of your body. °Get help right away if: °You feel confused. °Your vision is blurry. °You feel faint or you pass out. °You have a severe headache. °You have severe pain in your abdomen, your back, or the area between your waist and hips (pelvis). °You have chest pain, shortness of breath, or an irregular or fast heartbeat. °You are unable to urinate, or you urinate less than normal. °You have abnormal bleeding, such as bleeding from the rectum, vagina, nose, lungs, or nipples. °You vomit blood. °You have thoughts about hurting yourself or others. °If you ever feel like you may hurt yourself or others, or have thoughts about taking your own life, get help right away. You can go to your nearest emergency department or call: °Your local emergency services (911 in the [place] °A suicide crisis helpline, such as the National Suicide Prevention Lifeline at 1-800-273-8255 or 988 in the [place] This is open 24 hours a day. °Summary °If you have fatigue, you feel tired all the time and have a lack of energy or a lack of motivation. °Fatigue may make it difficult to start or complete tasks because of exhaustion. °Long-lasting (chronic) or extreme fatigue may be a symptom of a medical condition. °Exercise regularly, as told by your health care provider. °Change situations that cause you stress. Try to keep your work and personal schedule in balance. °This information is not intended to replace advice given to you by your health care provider. Make sure you discuss any questions you have with your health care provider. °Document Revised:   11/24/2020 Document Reviewed: 03/11/2020 °Elsevier Patient Education © 2022 Elsevier Inc. ° °

## 2021-07-05 NOTE — Progress Notes (Signed)
Subjective:   By signing my name below, I, [name], attest that this documentation has been prepared under the direction and in the presence of [name], DO  07/05/2021    Patient ID: Allison Cox, female    DOB: 12-30-1945, 76 y.o.   MRN: [id number]  Chief Complaint  Patient presents with   Chills   Abdominal Pain    Going on since 06/20/21    Abdominal Pain Associated symptoms include frequency. Pertinent negatives include no fever.  Patient is in today for a office visit.  She complains of chills while urinating, frequency, aching and bloating abdominal pain, and fatigue since 06/20/2021. She denies having fevers, SOB, congestion, increased gas, or vaginal discharge. She is also feeling colder throughout the day more than usual. She had seen by telemedicine services to manage her symptoms and was prescribed Macrobid for 5 days. She continues having symptoms after completing her antibiotic course.    Past Medical History:  Diagnosis Date   Anxiety    Arthritis    knee   Colon polyps    hyperplastic and Adenomatous   Hyperlipidemia    Osteopenia     Past Surgical History:  Procedure Laterality Date   CARPAL TUNNEL RELEASE Left    COLONOSCOPY  07/2012   Henrene Pastor - polyps   KNEE ARTHROSCOPY Right    WISDOM TOOTH EXTRACTION      Family History  Problem Relation Age of Onset   Coronary artery disease Other    Stroke Mother    Heart attack Father    Colon cancer Neg Hx    Rectal cancer Neg Hx    Stomach cancer Neg Hx     Social History   Socioeconomic History   Marital status: Single    Spouse name: Not on file   Number of children: Not on file   Years of education: Not on file   Highest education level: Not on file  Occupational History   Not on file  Tobacco Use   Smoking status: Former    Types: Cigarettes    Quit date: 05/15/1986    Years since quitting: 35.1   Smokeless tobacco: Never  Vaping Use   Vaping Use: Never used   Substance and Sexual Activity   Alcohol use: Yes    Alcohol/week: 3.0 - 4.0 standard drinks    Types: 3 - 4 Glasses of wine per week   Drug use: No   Sexual activity: Yes    Partners: Male    Birth control/protection: Post-menopausal  Other Topics Concern   Not on file  Social History Narrative   Not on file   Social Determinants of Health   Financial Resource Strain: Not on file  Food Insecurity: Not on file  Transportation Needs: Not on file  Physical Activity: Not on file  Stress: Not on file  Social Connections: Not on file  Intimate Partner Violence: Not on file    Outpatient Medications Prior to Visit  Medication Sig Dispense Refill   atorvastatin (LIPITOR) 20 MG tablet Take 1 tablet (20 mg total) by mouth daily. 90 tablet 1   LORazepam (ATIVAN) 0.5 MG tablet Take 1 tablet (0.5 mg total) by mouth every 8 (eight) hours as needed for anxiety. 90 tablet 1   sertraline (ZOLOFT) 100 MG tablet TAKE 1 & 1/2 (ONE & ONE-HALF) TABLETS BY MOUTH ONCE DAILY 135 tablet 0   COVID-19 mRNA bivalent vaccine, Pfizer, (PFIZER COVID-19 VAC BIVALENT) injection Inject into  the muscle. 0.3 mL 0   Facility-Administered Medications Prior to Visit  Medication Dose Route Frequency Provider Last Rate Last Admin   0.9 %  sodium chloride infusion  500 mL Intravenous Once Irene Shipper, MD        Allergies  Allergen Reactions   Codeine Nausea And Vomiting    Review of Systems  Constitutional:  Positive for chills (while urinating). Negative for fever.  HENT:  Negative for congestion.   Respiratory:  Negative for shortness of breath.   Gastrointestinal:  Positive for abdominal pain (aching, bloating).       (+)increased flatulence  Genitourinary:  Positive for frequency.       (-)Vaginal discharge      Objective:    Physical Exam Constitutional:      General: She is not in acute distress.    Appearance: Normal appearance. She is not ill-appearing.  HENT:     Head: Normocephalic and  atraumatic.     Right Ear: External ear normal.     Left Ear: External ear normal.  Eyes:     Extraocular Movements: Extraocular movements intact.     Pupils: Pupils are equal, round, and reactive to light.  Cardiovascular:     Rate and Rhythm: Normal rate and regular rhythm.     Heart sounds: Normal heart sounds. No murmur heard.   No gallop.  Pulmonary:     Effort: Pulmonary effort is normal. No respiratory distress.     Breath sounds: Normal breath sounds. No wheezing or rales.  Abdominal:     Palpations: Abdomen is soft.     Tenderness: There is no abdominal tenderness. There is no right CVA tenderness or left CVA tenderness.  Skin:    General: Skin is warm and dry.  Neurological:     Mental Status: She is alert and oriented to person, place, and time.  Psychiatric:        Behavior: Behavior normal.        Judgment: Judgment normal.    BP 130/60    Pulse 74    Temp 98.1 F (36.7 C) (Oral)    Ht 5\' 8"  (1.727 m)    Wt 247 lb 12.8 oz (112.4 kg)    SpO2 97%    BMI 37.68 kg/m  Wt Readings from Last 3 Encounters:  07/05/21 247 lb 12.8 oz (112.4 kg)  04/18/21 244 lb 12.8 oz (111 kg)  10/21/20 238 lb 9.6 oz (108.2 kg)    Diabetic Foot Exam - Simple   No data filed    Lab Results  Component Value Date   WBC 5.2 04/18/2021   HGB 12.9 04/18/2021   HCT 38.3 04/18/2021   PLT 213.0 04/18/2021   GLUCOSE 96 04/18/2021   CHOL 195 04/18/2021   TRIG 130.0 04/18/2021   HDL 64.10 04/18/2021   LDLDIRECT 125.1 10/06/2011   LDLCALC 105 (H) 04/18/2021   ALT 17 04/18/2021   AST 16 04/18/2021   NA 139 04/18/2021   K 4.4 04/18/2021   CL 102 04/18/2021   CREATININE 0.73 04/18/2021   BUN 12 04/18/2021   CO2 32 04/18/2021   TSH 2.68 08/11/2009    Lab Results  Component Value Date   TSH 2.68 08/11/2009   Lab Results  Component Value Date   WBC 5.2 04/18/2021   HGB 12.9 04/18/2021   HCT 38.3 04/18/2021   MCV 89.5 04/18/2021   PLT 213.0 04/18/2021   Lab Results  Component  Value Date  NA 139 04/18/2021   K 4.4 04/18/2021   CO2 32 04/18/2021   GLUCOSE 96 04/18/2021   BUN 12 04/18/2021   CREATININE 0.73 04/18/2021   BILITOT 0.5 04/18/2021   ALKPHOS 62 04/18/2021   AST 16 04/18/2021   ALT 17 04/18/2021   PROT 6.8 04/18/2021   ALBUMIN 4.5 04/18/2021   CALCIUM 9.4 04/18/2021   GFR 81.20 04/18/2021   Lab Results  Component Value Date   CHOL 195 04/18/2021   Lab Results  Component Value Date   HDL 64.10 04/18/2021   Lab Results  Component Value Date   LDLCALC 105 (H) 04/18/2021   Lab Results  Component Value Date   TRIG 130.0 04/18/2021   Lab Results  Component Value Date   CHOLHDL 3 04/18/2021   No results found for: HGBA1C     Assessment & Plan:   Problem List Items Addressed This Visit       Unprioritized   Abdominal pain - Primary    Check labs  Urine culture pending       Relevant Orders   POCT urinalysis dipstick (Completed)   Urine Culture   CBC with Differential/Platelet   Comprehensive metabolic panel   Vitamin M84   VITAMIN D 25 Hydroxy (Vit-D Deficiency, Fractures)   Thyroid Panel With TSH   Amylase   Lipase   Chills (without fever)   Relevant Orders   CBC with Differential/Platelet   Comprehensive metabolic panel   Vitamin X32   VITAMIN D 25 Hydroxy (Vit-D Deficiency, Fractures)   Thyroid Panel With TSH   Amylase   Lipase   POC COVID-19 (Completed)   Other fatigue   Relevant Orders   CBC with Differential/Platelet   Comprehensive metabolic panel   Vitamin G40   VITAMIN D 25 Hydroxy (Vit-D Deficiency, Fractures)   Thyroid Panel With TSH     No orders of the defined types were placed in this encounter.   IAnn Held, DO, personally preformed the services described in this documentation.  All medical record entries made by the scribe were at my direction and in my presence.  I have reviewed the chart and discharge instructions (if applicable) and agree that the record reflects my personal  performance and is accurate and complete. 07/05/2021   I,[name],acting as a scribe for [name], DO.,have documented all relevant documentation on the behalf of [name], DO,as directed by  [name], DO while in the presence of [name], DO.   [name], DO

## 2021-07-06 LAB — COMPREHENSIVE METABOLIC PANEL
ALT: 14 U/L (ref 0–35)
AST: 15 U/L (ref 0–37)
Albumin: 4.2 g/dL (ref 3.5–5.2)
Alkaline Phosphatase: 71 U/L (ref 39–117)
BUN: 11 mg/dL (ref 6–23)
CO2: 31 mEq/L (ref 19–32)
Calcium: 8.8 mg/dL (ref 8.4–10.5)
Chloride: 103 mEq/L (ref 96–112)
Creatinine, Ser: 0.71 mg/dL (ref 0.40–1.20)
GFR: 83.82 mL/min (ref >60.00)
Glucose, Bld: 87 mg/dL (ref 70–99)
Potassium: 3.8 mEq/L (ref 3.5–5.1)
Sodium: 139 mEq/L (ref 135–145)
Total Bilirubin: 0.3 mg/dL (ref 0.2–1.2)
Total Protein: 6.9 g/dL (ref 6.0–8.3)

## 2021-07-06 LAB — CBC WITH DIFFERENTIAL/PLATELET
Basophils Absolute: 0.1 10*3/uL (ref 0.0–0.1)
Basophils Relative: 1.1 % (ref 0.0–3.0)
Eosinophils Absolute: 0.1 10*3/uL (ref 0.0–0.7)
Eosinophils Relative: 1.7 % (ref 0.0–5.0)
HCT: 35 % — ABNORMAL LOW (ref 36.0–46.0)
Hemoglobin: 12 g/dL (ref 12.0–15.0)
Lymphocytes Relative: 13.9 % (ref 12.0–46.0)
Lymphs Abs: 0.8 10*3/uL (ref 0.7–4.0)
MCHC: 34.3 g/dL (ref 30.0–36.0)
MCV: 86.2 fl (ref 78.0–100.0)
Monocytes Absolute: 0.5 10*3/uL (ref 0.1–1.0)
Monocytes Relative: 8.4 % (ref 3.0–12.0)
Neutro Abs: 4.4 10*3/uL (ref 1.4–7.7)
Neutrophils Relative %: 74.9 % (ref 43.0–77.0)
Platelets: 199 10*3/uL (ref 150.0–400.0)
RBC: 4.06 Mil/uL (ref 3.87–5.11)
RDW: 13.8 % (ref 11.5–15.5)
WBC: 5.9 10*3/uL (ref 4.0–10.5)

## 2021-07-06 LAB — VITAMIN B12: Vitamin B-12: 265 pg/mL (ref 211–911)

## 2021-07-06 LAB — URINE CULTURE
MICRO NUMBER:: 13037014
SPECIMEN QUALITY:: ADEQUATE

## 2021-07-06 LAB — AMYLASE: Amylase: 29 U/L (ref 27–131)

## 2021-07-06 LAB — VITAMIN D 25 HYDROXY (VIT D DEFICIENCY, FRACTURES): VITD: 11.64 ng/mL — ABNORMAL LOW (ref 30.00–100.00)

## 2021-07-06 LAB — LIPASE: Lipase: 30 U/L (ref 11.0–59.0)

## 2021-07-13 ENCOUNTER — Other Ambulatory Visit: Payer: Self-pay | Admitting: Family Medicine

## 2021-07-13 DIAGNOSIS — F411 Generalized anxiety disorder: Secondary | ICD-10-CM

## 2021-07-18 ENCOUNTER — Other Ambulatory Visit: Payer: Self-pay

## 2021-07-18 MED ORDER — VITAMIN D (ERGOCALCIFEROL) 1.25 MG (50000 UNIT) PO CAPS: 50000.0000 [IU] | ORAL_CAPSULE | ORAL | 1 refills | Status: DC

## 2021-08-12 ENCOUNTER — Encounter: Payer: Self-pay | Admitting: Surgical

## 2021-08-12 ENCOUNTER — Ambulatory Visit: Payer: Medicare Other | Admitting: Surgical

## 2021-08-12 VITALS — Ht 68.0 in | Wt 247.8 lb

## 2021-08-12 DIAGNOSIS — M1712 Unilateral primary osteoarthritis, left knee: Secondary | ICD-10-CM

## 2021-08-13 ENCOUNTER — Encounter: Payer: Self-pay | Admitting: Surgical

## 2021-08-13 NOTE — Progress Notes (Signed)
? ?Office Visit Note ?  ?Patient: Allison Cox           ?Date of Birth: February 04, 1946           ?MRN: [id number] ?Visit Date: 08/12/2021 ?Requested by: Carollee Herter, Kendrick Fries R, DO ?[address] ?[address] ?[place],  [place] [address] ?PCP: [name], DO ? ?Subjective: ?Chief Complaint  ?Patient presents with  ? Left Knee - Pain, Follow-up  ? ? ?HPI: Allison Cox is a 76 y.o. female who presents to the office complaining of left knee pain.  Patient has history of left knee osteoarthritis with MRI from August 2022 demonstrating degenerative changes primarily in the patellofemoral and to a lesser extent the medial compartment.  She had an injection on 01/14/2021 that provided good relief for her knee pain but lasted for about 6 weeks.  She states that she has discussed with her PCP who recommended proceeding with knee replacement surgery while she is still healthy and conditioned in order to optimize recovery from the procedure.  She is not interested in physical therapy or any serial injections.  She states her pain is worsening.  She has pain at rest.  Localizes most of her pain to the anterior and medial aspects of the knee.  Denies any groin pain or radicular pain.  She denies any significant medical history aside from hypertension.  She specifically denies any history of diabetes, current smoking, history of PE/DVT in her personal/family history.  She does not use any blood thinners.  She lives at home alone but has a friend that can come and stay with her for several weeks after surgery.  She has 2 steps to get into her home.  Denies any cardiac or pulmonary history.  She does not have a cardiologist.  She has history of right knee arthroscopy but has never had surgery on her left knee. ?             ?ROS: All systems reviewed are negative as they relate to the chief complaint within the history of present illness.  Patient denies fevers or chills. ? ?Assessment & Plan: ?Visit Diagnoses:  ?1.  Unilateral primary osteoarthritis, left knee   ? ? ?Plan: Patient is a 76 year old female who presents for evaluation of left knee pain following last injection on 01/14/2021.  She has history of left knee osteoarthritis that is primarily in the patellofemoral compartment with some component of medial compartment arthritis as well.  She has worsening pain and now has pain at rest.  Did have good relief from the injection but only for about 6 weeks.  She would like to proceed with knee replacement surgery at this time.  She feels she is in a good place in her life to have knee replacement surgery given her lack of significant medical comorbidities.  Discussed the risks and benefits of the procedure including the risk of nerve/vessel damage, knee stiffness, knee instability, need for revision surgery in the future, prosthetic joint infection, medical complication from surgery such as DVT/PE/cardiac event/death.  Discussed the rehabilitation after surgery and she understands that this is a fairly grueling recovery I will take 2 to 3 months of extensive rehabilitation.  She has discussed this surgery with her PCP who is comfortable with her proceeding with surgery.  She lives at home alone but does have a friend who can come and stay with her.  Only 2 steps to get to her house.  She did have diminished  nonpalpable pulse on exam today so ordered ABIs to be done prior to her posting for surgery.  She understands that I am not a surgeon and I assist with the surgical procedure; she is comfortable with first meeting Dr. Marlou Sa in the preop area or back in the clinic.  Follow-up after procedure. ? ?Follow-Up Instructions: No follow-ups on file.  ? ?Orders:  ?Orders Placed This Encounter  ?Procedures  ? VAS [place] ABI WITH/WO TBI  ? ?No orders of the defined types were placed in this encounter. ? ? ? ? Procedures: ?No procedures performed ? ? ?Clinical Data: ?No additional findings. ? ?Objective: ?Vital Signs: Ht '5\' 8"'$  (1.727 m)    Wt 247 lb 12.8 oz (112.4 kg)   BMI 37.68 kg/m?  ? ?Physical Exam:  ?Constitutional: Patient appears well-developed ?HEENT:  ?Head: Normocephalic ?Eyes:EOM are normal ?Neck: Normal range of motion ?Cardiovascular: Normal rate ?Pulmonary/chest: Effort normal ?Neurologic: Patient is alert ?Skin: Skin is warm ?Psychiatric: Patient has normal mood and affect ? ?Ortho Exam: Ortho exam demonstrates left knee with 5 degrees extension and 115 degrees of knee flexion.  Good quad strength rated 5/5.  She is able to perform straight leg raise without extensor lag.  Positive patellar grind test.  She has tenderness over the medial joint line primarily.  No calf tenderness.  Negative Homans' sign.  No pain with hip range of motion.  Negative Stinchfield sign.  There is no palpable pulse in her left foot with good cap refill noted.  Her foot is warm and feels well perfused. ? ?Specialty Comments:  ?No specialty comments available. ? ?Imaging: ?No results found. ? ? ?PMFS History: ?Patient Active Problem List  ? Diagnosis Date Noted  ? Chills (without fever) 07/05/2021  ? Other fatigue 07/05/2021  ? Preventative health care 10/21/2020  ? Primary hypertension 10/21/2020  ? Estrogen deficiency 10/21/2020  ? Right hip pain 04/27/2020  ? Generalized anxiety disorder 08/10/2017  ? Seasonal allergies 03/08/2017  ? Abdominal pain 02/19/2014  ? Obesity (BMI 30-39.9) 10/02/2013  ? 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 LOW BACK PAIN, CHRONIC 08/11/2009  ? COLONIC POLYPS, HYPERPLASTIC, HX OF 08/17/2008  ? Acute upper respiratory infection 04/17/2008  ? SINUSITIS- ACUTE-NOS 03/30/2008  ? Hyperlipidemia 09/26/2007  ? Anxiety state 01/01/2007  ? CYST, SEBACEOUS 01/01/2007  ? OSTEOPENIA 01/01/2007  ? ARTHROSCOPY, RIGHT KNEE, HX OF 01/01/2007  ? ?Past Medical History:  ?Diagnosis Date  ? Anxiety   ? Arthritis   ? knee  ? Colon polyps   ? hyperplastic and Adenomatous  ? Hyperlipidemia   ? Osteopenia   ?  ?Family History  ?Problem Relation Age  of Onset  ? Coronary artery disease Other   ? Stroke Mother   ? Heart attack Father   ? Colon cancer Neg Hx   ? Rectal cancer Neg Hx   ? Stomach cancer Neg Hx   ?  ?Past Surgical History:  ?Procedure Laterality Date  ? CARPAL TUNNEL RELEASE Left   ? COLONOSCOPY  07/2012  ? Henrene Pastor - polyps  ? KNEE ARTHROSCOPY Right   ? WISDOM TOOTH EXTRACTION    ? ?Social History  ? ?Occupational History  ? Not on file  ?Tobacco Use  ? Smoking status: Former  ?  Types: Cigarettes  ?  Quit date: 05/15/1986  ?  Years since quitting: 35.2  ? Smokeless tobacco: Never  ?Vaping Use  ? Vaping Use: Never used  ?Substance and Sexual Activity  ? Alcohol use: Yes  ?  Alcohol/week: 3.0 - 4.0 standard drinks  ?  Types: 3 - 4 Glasses of wine per week  ? Drug use: No  ? Sexual activity: Yes  ?  Partners: Male  ?  Birth control/protection: Post-menopausal  ? ? ? ? ?  ?

## 2021-08-22 ENCOUNTER — Ambulatory Visit (HOSPITAL_COMMUNITY)
Admission: RE | Admit: 2021-08-22 | Discharge: 2021-08-22 | Disposition: A | Discharge status: Home / Self Care | Payer: Medicare Other | Source: Ambulatory Visit | Attending: Surgical | Admitting: Surgical

## 2021-08-22 DIAGNOSIS — M1712 Unilateral primary osteoarthritis, left knee: Secondary | ICD-10-CM | POA: Insufficient documentation

## 2021-08-23 ENCOUNTER — Telehealth: Payer: Self-pay | Admitting: Orthopedic Surgery

## 2021-08-23 NOTE — Telephone Encounter (Signed)
Patient would like for [place] to hold a date of 09-29-21 for left total knee arthroplasty.  She has a preference to general anesthesia over having a spinal because her blood pressure tends to run low.  She is concerned the spinal was cause it to drop too low.  Please advise so that I may complete the posting for her surgery.   ?

## 2021-08-26 NOTE — Telephone Encounter (Signed)
Done thx

## 2021-08-29 ENCOUNTER — Other Ambulatory Visit: Payer: Self-pay

## 2021-09-20 NOTE — Pre-Procedure Instructions (Signed)
Surgical Instructions ? ? ? Your procedure is scheduled on Thursday 09/29/21. ? ? Report to [facility] Main Entrance "A" at 1:40 P.M., then check in with the Admitting office. ? Call this number if you have problems the morning of surgery: ? [phone] ? ? If you have any questions prior to your surgery date call [phone]: Open Monday-Friday 8am-4pm ? ? ? Remember: ? Do not eat after midnight the night before your surgery ? ?You may drink clear liquids until 12:40 P.M. the day of your surgery.   ?Clear liquids allowed are: Water, Non-Citrus Juices (without pulp), Carbonated Beverages, Clear Tea, Black Coffee ONLY (NO MILK, CREAM OR POWDERED CREAMER of any kind), and Gatorade ? ? ?Enhanced Recovery after Surgery for Orthopedics ?Enhanced Recovery after Surgery is a protocol used to improve the stress on your body and your recovery after surgery. ? ?Patient Instructions ? ?The day of surgery (if you do NOT have diabetes):  ?Drink ONE (1) Pre-Surgery Clear Ensure by 12:40 P.M. the day of surgery   ?This drink was given to you during your hospital  ?pre-op appointment visit. ?Nothing else to drink after completing the  ?Pre-Surgery Clear Ensure. ? ?       If you have questions, please contact your surgeon?s office.  ?  ? Take these medicines the morning of surgery with A SIP OF WATER:  ? atorvastatin (LIPITOR)  ? sertraline (ZOLOFT)  ? ? LORazepam (ATIVAN)- If needed ? ? ? ?As of today, STOP taking any Aspirin (unless otherwise instructed by your surgeon) Aleve, Naproxen, Ibuprofen, Motrin, Advil, Goody's, BC's, all herbal medications, fish oil, and all vitamins. ? ?         ?Do not wear jewelry or makeup ?Do not wear lotions, powders, perfumes, or deodorant. ?Do not shave 48 hours prior to surgery.  Do not bring valuables to the hospital. ?Do not wear nail polish, gel polish, artificial nails, or any other type of covering on natural nails (fingers and toes) ?If you have artificial nails or gel coating that need  to be removed by a nail salon, please have this removed prior to surgery. Artificial nails or gel coating may interfere with anesthesia's ability to adequately monitor your vital signs. ? ?[facility] is not responsible for any belongings or valuables. .  ? ?Do NOT Smoke (Tobacco/Vaping)  24 hours prior to your procedure ? ?If you use a CPAP at night, you may bring your mask for your overnight stay. ?  ?Contacts, glasses, hearing aids, dentures or partials may not be worn into surgery, please bring cases for these belongings ?  ?For patients admitted to the hospital, discharge time will be determined by your treatment team. ?  ?Patients discharged the day of surgery will not be allowed to drive home, and someone needs to stay with them for 24 hours. ? ? ?SURGICAL WAITING ROOM VISITATION ?Patients having surgery or a procedure in a hospital may have two support people. ?Children under the age of 68 must have an adult with them who is not the patient. ?They may stay in the waiting area during the procedure and may switch out with other visitors. If the patient needs to stay at the hospital during part of their recovery, the visitor guidelines for inpatient rooms apply. ? ?Please refer to the [facility] website for the visitor guidelines for Inpatients (after your surgery is over and you are in a regular room).  ? ? ? ? ? ?Special instructions:   ? ?Oral  Hygiene is also important to reduce your risk of infection.  Remember - BRUSH YOUR TEETH THE MORNING OF SURGERY WITH YOUR REGULAR TOOTHPASTE ? ? ?[facility]- Preparing For Surgery ? ?Before surgery, you can play an important role. Because skin is not sterile, your skin needs to be as free of germs as possible. You can reduce the number of germs on your skin by washing with CHG (chlorahexidine gluconate) Soap before surgery.  CHG is an antiseptic cleaner which kills germs and bonds with the skin to continue killing germs even after washing.   ? ? ?Please do not use if  you have an allergy to CHG or antibacterial soaps. If your skin becomes reddened/irritated stop using the CHG.  ?Do not shave (including legs and underarms) for at least 48 hours prior to first CHG shower. It is OK to shave your face. ? ?Please follow these instructions carefully. ?  ? ? Shower the NIGHT BEFORE SURGERY and the MORNING OF SURGERY with CHG Soap.  ? If you chose to wash your hair, wash your hair first as usual with your normal shampoo. After you shampoo, rinse your hair and body thoroughly to remove the shampoo.  Then [employer] and genitals (private parts) with your normal soap and rinse thoroughly to remove soap. ? ?After that Use CHG Soap as you would any other liquid soap. You can apply CHG directly to the skin and wash gently with a scrungie or a clean washcloth.  ? ?Apply the CHG Soap to your body ONLY FROM THE NECK DOWN.  Do not use on open wounds or open sores. Avoid contact with your eyes, ears, mouth and genitals (private parts). Wash Face and genitals (private parts)  with your normal soap.  ? ?Wash thoroughly, paying special attention to the area where your surgery will be performed. ? ?Thoroughly rinse your body with warm water from the neck down. ? ?DO NOT shower/wash with your normal soap after using and rinsing off the CHG Soap. ? ?Pat yourself dry with a CLEAN TOWEL. ? ?Wear CLEAN PAJAMAS to bed the night before surgery ? ?Place CLEAN SHEETS on your bed the night before your surgery ? ?DO NOT SLEEP WITH PETS. ? ? ?Day of Surgery: ? ?Take a shower with CHG soap. ?Wear Clean/Comfortable clothing the morning of surgery ?Do not apply any deodorants/lotions.   ?Remember to brush your teeth WITH YOUR REGULAR TOOTHPASTE. ? ? ? ?If you received a COVID test during your pre-op visit, it is requested that you wear a mask when out in public, stay away from anyone that may not be feeling well, and notify your surgeon if you develop symptoms. If you have been in contact with anyone that has tested  positive in the last 10 days, please notify your surgeon. ? ?  ?Please read over the following fact sheets that you were given.  ? ?

## 2021-09-21 ENCOUNTER — Other Ambulatory Visit: Payer: Self-pay

## 2021-09-21 ENCOUNTER — Encounter (HOSPITAL_COMMUNITY): Payer: Self-pay

## 2021-09-21 ENCOUNTER — Encounter (HOSPITAL_COMMUNITY)
Admission: RE | Admit: 2021-09-21 | Discharge: 2021-09-21 | Disposition: A | Discharge status: Home / Self Care | Payer: Medicare Other | Source: Ambulatory Visit | Attending: Orthopedic Surgery | Admitting: Orthopedic Surgery

## 2021-09-21 VITALS — BP 120/66 | HR 74 | Temp 98.3°F | Resp 18 | Ht 67.0 in | Wt 242.6 lb

## 2021-09-21 DIAGNOSIS — Z01812 Encounter for preprocedural laboratory examination: Secondary | ICD-10-CM | POA: Insufficient documentation

## 2021-09-21 DIAGNOSIS — Z01818 Encounter for other preprocedural examination: Secondary | ICD-10-CM

## 2021-09-21 LAB — CBC
HCT: 41.8 % (ref 36.0–46.0)
Hemoglobin: 13.8 g/dL (ref 12.0–15.0)
MCH: 29.3 pg (ref 26.0–34.0)
MCHC: 33 g/dL (ref 30.0–36.0)
MCV: 88.7 fL (ref 80.0–100.0)
Platelets: 209 10*3/uL (ref 150–400)
RBC: 4.71 MIL/uL (ref 3.87–5.11)
RDW: 14 % (ref 11.5–15.5)
WBC: 5.1 10*3/uL (ref 4.0–10.5)
nRBC: 0 % (ref 0.0–0.2)

## 2021-09-21 LAB — BASIC METABOLIC PANEL
Anion gap: 5 (ref 5–15)
BUN: 20 mg/dL (ref 8–23)
CO2: 27 mmol/L (ref 22–32)
Calcium: 9.5 mg/dL (ref 8.9–10.3)
Chloride: 105 mmol/L (ref 98–111)
Creatinine, Ser: 0.72 mg/dL (ref 0.44–1.00)
GFR, Estimated: >60 mL/min (ref >60)
Glucose, Bld: 113 mg/dL — ABNORMAL HIGH (ref 70–99)
Potassium: 4.7 mmol/L (ref 3.5–5.1)
Sodium: 137 mmol/L (ref 135–145)

## 2021-09-21 LAB — URINALYSIS, COMPLETE (UACMP) WITH MICROSCOPIC
Bacteria, UA: NONE SEEN
Bilirubin Urine: NEGATIVE
Glucose, UA: NEGATIVE mg/dL
Hgb urine dipstick: NEGATIVE
Ketones, ur: NEGATIVE mg/dL
Leukocytes,Ua: NEGATIVE
Nitrite: NEGATIVE
Protein, ur: NEGATIVE mg/dL
Specific Gravity, Urine: 1.018 (ref 1.005–1.030)
pH: 5 (ref 5.0–8.0)

## 2021-09-21 LAB — SURGICAL PCR SCREEN
MRSA, PCR: NEGATIVE
Staphylococcus aureus: NEGATIVE

## 2021-09-21 NOTE — Progress Notes (Signed)
PCP - Dr. Roma Schanz ?Cardiologist - denies ? ?PPM/ICD - denies ? ? ?Chest x-ray - denies ?EKG - denies ?Stress Test - denies ?ECHO - denies ?Cardiac Cath - denies ? ?Sleep Study - denies ? ?DM- denies ? ?ASA/Blood Thinner Instructions: n/a ? ? ?ERAS Protcol - yes ?PRE-SURGERY Ensure given at PAT ? ?COVID TEST- n/a ? ? ?Anesthesia review: no ? ?Patient denies shortness of breath, fever, cough and chest pain at PAT appointment ? ? ?All instructions explained to the patient, with a verbal understanding of the material. Patient agrees to go over the instructions while at home for a better understanding. Patient also instructed to notify surgeon of any contact with COVID+ person or if she develops any symptoms. The opportunity to ask questions was provided. ?  ?

## 2021-09-22 ENCOUNTER — Encounter: Payer: Self-pay | Admitting: Family Medicine

## 2021-09-22 ENCOUNTER — Ambulatory Visit (INDEPENDENT_AMBULATORY_CARE_PROVIDER_SITE_OTHER): Payer: Medicare Other | Admitting: Family Medicine

## 2021-09-22 VITALS — BP 110/78 | HR 71 | Temp 98.1°F | Resp 18 | Ht 67.0 in | Wt 243.4 lb

## 2021-09-22 DIAGNOSIS — I10 Essential (primary) hypertension: Secondary | ICD-10-CM

## 2021-09-22 DIAGNOSIS — E559 Vitamin D deficiency, unspecified: Secondary | ICD-10-CM | POA: Diagnosis not present

## 2021-09-22 DIAGNOSIS — E785 Hyperlipidemia, unspecified: Secondary | ICD-10-CM | POA: Diagnosis not present

## 2021-09-22 DIAGNOSIS — Z01818 Encounter for other preprocedural examination: Secondary | ICD-10-CM | POA: Diagnosis not present

## 2021-09-22 LAB — LIPID PANEL
Cholesterol: 203 mg/dL — ABNORMAL HIGH (ref 0–200)
HDL: 73 mg/dL (ref >39.00)
LDL Cholesterol: 109 mg/dL — ABNORMAL HIGH (ref 0–99)
NonHDL: 129.61
Total CHOL/HDL Ratio: 3
Triglycerides: 101 mg/dL (ref 0.0–149.0)
VLDL: 20.2 mg/dL (ref 0.0–40.0)

## 2021-09-22 LAB — URINE CULTURE

## 2021-09-22 LAB — COMPREHENSIVE METABOLIC PANEL
ALT: 15 U/L (ref 0–35)
AST: 14 U/L (ref 0–37)
Albumin: 4.7 g/dL (ref 3.5–5.2)
Alkaline Phosphatase: 68 U/L (ref 39–117)
BUN: 14 mg/dL (ref 6–23)
CO2: 31 mEq/L (ref 19–32)
Calcium: 9.5 mg/dL (ref 8.4–10.5)
Chloride: 101 mEq/L (ref 96–112)
Creatinine, Ser: 0.72 mg/dL (ref 0.40–1.20)
GFR: 82.3 mL/min (ref >60.00)
Glucose, Bld: 110 mg/dL — ABNORMAL HIGH (ref 70–99)
Potassium: 4.6 mEq/L (ref 3.5–5.1)
Sodium: 138 mEq/L (ref 135–145)
Total Bilirubin: 0.3 mg/dL (ref 0.2–1.2)
Total Protein: 7.4 g/dL (ref 6.0–8.3)

## 2021-09-22 LAB — VITAMIN D 25 HYDROXY (VIT D DEFICIENCY, FRACTURES): VITD: 28.01 ng/mL — ABNORMAL LOW (ref 30.00–100.00)

## 2021-09-22 NOTE — Assessment & Plan Note (Signed)
Well controlled, no changes to meds. Encouraged heart healthy diet such as the DASH diet and exercise as tolerated.  °

## 2021-09-22 NOTE — Assessment & Plan Note (Signed)
Encourage heart healthy diet such as MIND or DASH diet, increase exercise, avoid trans fats, simple carbohydrates and processed foods, consider a krill or fish or flaxseed oil cap daily.  °

## 2021-09-22 NOTE — Progress Notes (Signed)
? ?Subjective:  ? ?By signing my name below, I, [name], attest that this documentation has been prepared under the direction and in the presence of [name], DO  09/22/2021 ?   ? ? Patient ID: Allison Cox, female    DOB: 1946/02/08, 76 y.o.   MRN: [id number] ? ?Chief Complaint  ?Patient presents with  ? Hyperlipidemia  ? Follow-up  ? ? ?Hyperlipidemia ?Pertinent negatives include no chest pain or shortness of breath.  ?Patient is in today for a office visit.  ? ?She has an upcomming total left knee procedure next month with Dr. Marlou Sa.  ?She continues taking 20 mg atorvastatin daily PO and reports no new issues while taking it. She is interested in receiving lab work to check her current cholesterol levels.  ?Lab Results  ?Component Value Date  ? CHOL 195 04/18/2021  ? HDL 64.10 04/18/2021  ? LDLCALC 105 (H) 04/18/2021  ? LDLDIRECT 125.1 10/06/2011  ? TRIG 130.0 04/18/2021  ? CHOLHDL 3 04/18/2021  ? ?She continues taking 1.25 Drisdol every 7 days and reports no new issues while taking it. She is interested in checking her vitamin D levels during her next lab work.  ? ? ?Past Medical History:  ?Diagnosis Date  ? Anxiety   ? Arthritis   ? knee  ? Colon polyps   ? hyperplastic and Adenomatous  ? Hyperlipidemia   ? Osteopenia   ? ? ?Past Surgical History:  ?Procedure Laterality Date  ? CARPAL TUNNEL RELEASE Left   ? COLONOSCOPY  07/2012  ? Henrene Pastor - polyps  ? KNEE ARTHROSCOPY Right   ? WISDOM TOOTH EXTRACTION    ? ? ?Family History  ?Problem Relation Age of Onset  ? Coronary artery disease Other   ? Stroke Mother   ? Heart attack Father   ? Colon cancer Neg Hx   ? Rectal cancer Neg Hx   ? Stomach cancer Neg Hx   ? ? ?Social History  ? ?Socioeconomic History  ? Marital status: Single  ?  Spouse name: Not on file  ? Number of children: 0  ? Years of education: Not on file  ? Highest education level: Not on file  ?Occupational History  ? Not on file  ?Tobacco Use  ? Smoking status: Former  ?  Types:  Cigarettes  ?  Quit date: 05/15/1986  ?  Years since quitting: 35.3  ? Smokeless tobacco: Never  ?Vaping Use  ? Vaping Use: Never used  ?Substance and Sexual Activity  ? Alcohol use: Yes  ?  Alcohol/week: 3.0 - 4.0 standard drinks  ?  Types: 3 - 4 Glasses of wine per week  ? Drug use: No  ? Sexual activity: Yes  ?  Partners: Male  ?  Birth control/protection: Post-menopausal  ?Other Topics Concern  ? Not on file  ?Social History Narrative  ? Not on file  ? ?Social Determinants of Health  ? ?Financial Resource Strain: Not on file  ?Food Insecurity: Not on file  ?Transportation Needs: Not on file  ?Physical Activity: Not on file  ?Stress: Not on file  ?Social Connections: Not on file  ?Intimate Partner Violence: Not on file  ? ? ?Outpatient Medications Prior to Visit  ?Medication Sig Dispense Refill  ? atorvastatin (LIPITOR) 20 MG tablet Take 1 tablet (20 mg total) by mouth daily. 90 tablet 1  ? LORazepam (ATIVAN) 0.5 MG tablet Take 1 tablet (0.5 mg total) by mouth every 8 (eight) hours as  needed for anxiety. 90 tablet 1  ? sertraline (ZOLOFT) 100 MG tablet TAKE 1 & 1/2 (ONE & ONE-HALF) TABLETS BY MOUTH ONCE DAILY 135 tablet 1  ? Vitamin D, Ergocalciferol, (DRISDOL) 1.25 MG (50000 UNIT) CAPS capsule Take 1 capsule (50,000 Units total) by mouth every 7 (seven) days. 12 capsule 1  ? ?Facility-Administered Medications Prior to Visit  ?Medication Dose Route Frequency Provider Last Rate Last Admin  ? 0.9 %  sodium chloride infusion  500 mL Intravenous Once Irene Shipper, MD      ? ? ?Allergies  ?Allergen Reactions  ? Codeine Nausea And Vomiting  ? ? ?Review of Systems  ?Constitutional:  Negative for fever and malaise/fatigue.  ?HENT:  Negative for congestion.   ?Eyes:  Negative for blurred vision.  ?Respiratory:  Negative for cough and shortness of breath.   ?Cardiovascular:  Negative for chest pain, palpitations and leg swelling.  ?Gastrointestinal:  Negative for vomiting.  ?Musculoskeletal:  Negative for back pain.  ?Skin:   Negative for rash.  ?Neurological:  Negative for loss of consciousness and headaches.  ? ?   ?Objective:  ?  ?Physical Exam ?Vitals and nursing note reviewed.  ?Constitutional:   ?   General: She is not in acute distress. ?   Appearance: Normal appearance. She is not ill-appearing.  ?HENT:  ?   Head: Normocephalic and atraumatic.  ?   Right Ear: External ear normal.  ?   Left Ear: External ear normal.  ?Eyes:  ?   Extraocular Movements: Extraocular movements intact.  ?   Pupils: Pupils are equal, round, and reactive to light.  ?Cardiovascular:  ?   Rate and Rhythm: Normal rate and regular rhythm.  ?   Heart sounds: Normal heart sounds. No murmur heard. ?  No gallop.  ?Pulmonary:  ?   Effort: Pulmonary effort is normal. No respiratory distress.  ?   Breath sounds: Normal breath sounds. No wheezing or rales.  ?Skin: ?   General: Skin is warm and dry.  ?Neurological:  ?   Mental Status: She is alert and oriented to person, place, and time.  ?Psychiatric:     ?   Judgment: Judgment normal.  ? ? ?BP 110/78 (BP Location: Left Arm, Patient Position: Sitting, Cuff Size: Large)   Pulse 71   Temp 98.1 ?F (36.7 ?C) (Oral)   Resp 18   Ht '5\' 7"'$  (1.702 m)   Wt 243 lb 6.4 oz (110.4 kg)   SpO2 97%   BMI 38.12 kg/m?  ?Wt Readings from Last 3 Encounters:  ?09/22/21 243 lb 6.4 oz (110.4 kg)  ?09/21/21 242 lb 9.6 oz (110 kg)  ?08/12/21 247 lb 12.8 oz (112.4 kg)  ? ? ?Diabetic Foot Exam - Simple   ?No data filed ?  ? ?Lab Results  ?Component Value Date  ? WBC 5.1 09/21/2021  ? HGB 13.8 09/21/2021  ? HCT 41.8 09/21/2021  ? PLT 209 09/21/2021  ? GLUCOSE 113 (H) 09/21/2021  ? CHOL 195 04/18/2021  ? TRIG 130.0 04/18/2021  ? HDL 64.10 04/18/2021  ? LDLDIRECT 125.1 10/06/2011  ? LDLCALC 105 (H) 04/18/2021  ? ALT 14 07/05/2021  ? AST 15 07/05/2021  ? NA 137 09/21/2021  ? K 4.7 09/21/2021  ? CL 105 09/21/2021  ? CREATININE 0.72 09/21/2021  ? BUN 20 09/21/2021  ? CO2 27 09/21/2021  ? TSH 3.62 07/05/2021  ? ? ?Lab Results  ?Component  Value Date  ? TSH 3.62 07/05/2021  ? ?Lab  Results  ?Component Value Date  ? WBC 5.1 09/21/2021  ? HGB 13.8 09/21/2021  ? HCT 41.8 09/21/2021  ? MCV 88.7 09/21/2021  ? PLT 209 09/21/2021  ? ?Lab Results  ?Component Value Date  ? NA 137 09/21/2021  ? K 4.7 09/21/2021  ? CO2 27 09/21/2021  ? GLUCOSE 113 (H) 09/21/2021  ? BUN 20 09/21/2021  ? CREATININE 0.72 09/21/2021  ? BILITOT 0.3 07/05/2021  ? ALKPHOS 71 07/05/2021  ? AST 15 07/05/2021  ? ALT 14 07/05/2021  ? PROT 6.9 07/05/2021  ? ALBUMIN 4.2 07/05/2021  ? CALCIUM 9.5 09/21/2021  ? ANIONGAP 5 09/21/2021  ? GFR 83.82 07/05/2021  ? ?Lab Results  ?Component Value Date  ? CHOL 195 04/18/2021  ? ?Lab Results  ?Component Value Date  ? HDL 64.10 04/18/2021  ? ?Lab Results  ?Component Value Date  ? LDLCALC 105 (H) 04/18/2021  ? ?Lab Results  ?Component Value Date  ? TRIG 130.0 04/18/2021  ? ?Lab Results  ?Component Value Date  ? CHOLHDL 3 04/18/2021  ? ?No results found for: HGBA1C ? ?  Ekg--- nsr  ?Assessment & Plan:  ? ?Problem List Items Addressed This Visit   ? ?  ? Unprioritized  ? Hyperlipidemia  ? Relevant Orders  ? Comprehensive metabolic panel  ? Lipid panel  ? ?Other Visit Diagnoses   ? ? Preop examination    -  Primary  ? Relevant Orders  ? EKG 12-Lead (Completed)  ? Vitamin D deficiency      ? Relevant Orders  ? VITAMIN D 25 Hydroxy (Vit-D Deficiency, Fractures)  ? ?  ? ? ? ?No orders of the defined types were placed in this encounter. ? ? ?I, [name], DO, personally preformed the services described in this documentation.  All medical record entries made by the scribe were at my direction and in my presence.  I have reviewed the chart and discharge instructions (if applicable) and agree that the record reflects my personal performance and is accurate and complete. 09/22/2021 ? ? ?Engineering geologist as a Education administrator for [employer], DO.,have documented all relevant documentation on the behalf of [name], DO,as directed by   [name], DO while in the presence of [name], DO. ? ? ?[name], DO ? ?

## 2021-09-22 NOTE — Patient Instructions (Signed)
Cholesterol Content in Foods ?Cholesterol is a waxy, fat-like substance that helps to carry fat in the blood. The body needs cholesterol in small amounts, but too much cholesterol can cause damage to the arteries and heart. ?What foods have cholesterol? ? ?Cholesterol is found in animal-based foods, such as meat, seafood, and dairy. Generally, low-fat dairy and lean meats have less cholesterol than full-fat dairy and fatty meats. The milligrams of cholesterol per serving (mg per serving) of common cholesterol-containing foods are listed below. ?Meats and other proteins ?Egg -- one large whole egg has 186 mg. ?Veal shank -- 4 oz (113 g) has 141 mg. ?Lean ground turkey (93% lean) -- 4 oz (113 g) has 118 mg. ?Fat-trimmed lamb loin -- 4 oz (113 g) has 106 mg. ?Lean ground beef (90% lean) -- 4 oz (113 g) has 100 mg. ?Lobster -- 3.5 oz (99 g) has 90 mg. ?Pork loin chops -- 4 oz (113 g) has 86 mg. ?Canned salmon -- 3.5 oz (99 g) has 83 mg. ?Fat-trimmed beef top loin -- 4 oz (113 g) has 78 mg. ?Frankfurter -- 1 frank (3.5 oz or 99 g) has 77 mg. ?Crab -- 3.5 oz (99 g) has 71 mg. ?Roasted chicken without skin, white meat -- 4 oz (113 g) has 66 mg. ?Light bologna -- 2 oz (57 g) has 45 mg. ?Deli-cut turkey -- 2 oz (57 g) has 31 mg. ?Canned tuna -- 3.5 oz (99 g) has 31 mg. ?Bacon -- 1 oz (28 g) has 29 mg. ?Oysters and mussels (raw) -- 3.5 oz (99 g) has 25 mg. ?Mackerel -- 1 oz (28 g) has 22 mg. ?Trout -- 1 oz (28 g) has 20 mg. ?Pork sausage -- 1 link (1 oz or 28 g) has 17 mg. ?Salmon -- 1 oz (28 g) has 16 mg. ?Tilapia -- 1 oz (28 g) has 14 mg. ?Dairy ?Soft-serve ice cream -- ? cup (4 oz or 86 g) has 103 mg. ?Whole-milk yogurt -- 1 cup (8 oz or 245 g) has 29 mg. ?Cheddar cheese -- 1 oz (28 g) has 28 mg. ?American cheese -- 1 oz (28 g) has 28 mg. ?Whole milk -- 1 cup (8 oz or 250 mL) has 23 mg. ?2% milk -- 1 cup (8 oz or 250 mL) has 18 mg. ?Cream cheese -- 1 tablespoon (Tbsp) (14.5 g) has 15 mg. ?Cottage cheese -- ? cup (4 oz or  113 g) has 14 mg. ?Low-fat (1%) milk -- 1 cup (8 oz or 250 mL) has 10 mg. ?Sour cream -- 1 Tbsp (12 g) has 8.5 mg. ?Low-fat yogurt -- 1 cup (8 oz or 245 g) has 8 mg. ?Nonfat Greek yogurt -- 1 cup (8 oz or 228 g) has 7 mg. ?Half-and-half cream -- 1 Tbsp (15 mL) has 5 mg. ?Fats and oils ?Cod liver oil -- 1 tablespoon (Tbsp) (13.6 g) has 82 mg. ?Butter -- 1 Tbsp (14 g) has 15 mg. ?Lard -- 1 Tbsp (12.8 g) has 14 mg. ?Bacon grease -- 1 Tbsp (12.9 g) has 14 mg. ?Mayonnaise -- 1 Tbsp (13.8 g) has 5-10 mg. ?Margarine -- 1 Tbsp (14 g) has 3-10 mg. ?The items listed above may not be a complete list of foods with cholesterol. Exact amounts of cholesterol in these foods may vary depending on specific ingredients and brands. Contact a dietitian for more information. ?What foods do not have cholesterol? ?Most plant-based foods do not have cholesterol unless you combine them with a food that has   cholesterol. Foods without cholesterol include: ?Grains and cereals. ?Vegetables. ?Fruits. ?Vegetable oils, such as olive, canola, and sunflower oil. ?Legumes, such as peas, beans, and lentils. ?Nuts and seeds. ?Egg whites. ?The items listed above may not be a complete list of foods that do not have cholesterol. Contact a dietitian for more information. ?Summary ?The body needs cholesterol in small amounts, but too much cholesterol can cause damage to the arteries and heart. ?Cholesterol is found in animal-based foods, such as meat, seafood, and dairy. Generally, low-fat dairy and lean meats have less cholesterol than full-fat dairy and fatty meats. ?This information is not intended to replace advice given to you by your health care provider. Make sure you discuss any questions you have with your health care provider. ?Document Revised: 09/10/2020 Document Reviewed: 09/10/2020 ?Elsevier Patient Education ? 2023 Elsevier Inc. ? ?

## 2021-09-22 NOTE — Assessment & Plan Note (Signed)
Pt having total L knee replacement next week ?Cleared for surgery ?

## 2021-09-28 MED ORDER — TRANEXAMIC ACID 1000 MG/10ML IV SOLN
2000.0000 mg | INTRAVENOUS | Status: DC
  Filled 2021-09-28: qty 20

## 2021-09-29 ENCOUNTER — Other Ambulatory Visit: Payer: Self-pay

## 2021-09-29 ENCOUNTER — Ambulatory Visit (HOSPITAL_COMMUNITY): Payer: Medicare Other | Admitting: Anesthesiology

## 2021-09-29 ENCOUNTER — Encounter (HOSPITAL_COMMUNITY)
Admission: RE | Disposition: A | Discharge status: Home Health | Payer: Self-pay | Source: Home / Self Care | Attending: Orthopedic Surgery

## 2021-09-29 ENCOUNTER — Encounter (HOSPITAL_COMMUNITY): Payer: Self-pay | Admitting: Orthopedic Surgery

## 2021-09-29 ENCOUNTER — Ambulatory Visit (HOSPITAL_BASED_OUTPATIENT_CLINIC_OR_DEPARTMENT_OTHER): Payer: Medicare Other | Admitting: Anesthesiology

## 2021-09-29 ENCOUNTER — Observation Stay (HOSPITAL_COMMUNITY)
Admission: RE | Admit: 2021-09-29 | Discharge: 2021-10-01 | Disposition: A | Discharge status: Home Health | Payer: Medicare Other | Attending: Orthopedic Surgery | Admitting: Orthopedic Surgery

## 2021-09-29 DIAGNOSIS — I10 Essential (primary) hypertension: Secondary | ICD-10-CM | POA: Insufficient documentation

## 2021-09-29 DIAGNOSIS — Z96652 Presence of left artificial knee joint: Secondary | ICD-10-CM

## 2021-09-29 DIAGNOSIS — M1712 Unilateral primary osteoarthritis, left knee: Secondary | ICD-10-CM

## 2021-09-29 DIAGNOSIS — Z79899 Other long term (current) drug therapy: Secondary | ICD-10-CM | POA: Diagnosis not present

## 2021-09-29 DIAGNOSIS — Z01818 Encounter for other preprocedural examination: Secondary | ICD-10-CM

## 2021-09-29 DIAGNOSIS — Z87891 Personal history of nicotine dependence: Secondary | ICD-10-CM | POA: Insufficient documentation

## 2021-09-29 DIAGNOSIS — G8918 Other acute postprocedural pain: Secondary | ICD-10-CM | POA: Diagnosis not present

## 2021-09-29 DIAGNOSIS — M25562 Pain in left knee: Secondary | ICD-10-CM | POA: Insufficient documentation

## 2021-09-29 HISTORY — PX: TOTAL KNEE ARTHROPLASTY: SHX125

## 2021-09-29 SURGERY — ARTHROPLASTY, KNEE, TOTAL
Anesthesia: Monitor Anesthesia Care | Site: Knee | Laterality: Left

## 2021-09-29 MED ORDER — LIDOCAINE 2% (20 MG/ML) 5 ML SYRINGE
INTRAMUSCULAR | Status: DC | PRN
Start: 2021-09-29 — End: 2021-09-29
  Administered 2021-09-29: 20 mg via INTRAVENOUS

## 2021-09-29 MED ORDER — BUPIVACAINE HCL (PF) 0.25 % IJ SOLN
INTRAMUSCULAR | Status: AC
  Filled 2021-09-29: qty 20

## 2021-09-29 MED ORDER — PHENYLEPHRINE HCL-NACL 20-0.9 MG/250ML-% IV SOLN
INTRAVENOUS | Status: DC | PRN
Start: 2021-09-29 — End: 2021-09-29
  Administered 2021-09-29: 25 ug/min via INTRAVENOUS

## 2021-09-29 MED ORDER — PROPOFOL 500 MG/50ML IV EMUL
INTRAVENOUS | Status: DC | PRN
  Administered 2021-09-29: 80 ug/kg/min via INTRAVENOUS

## 2021-09-29 MED ORDER — ROPIVACAINE HCL 5 MG/ML IJ SOLN
INTRAMUSCULAR | Status: DC | PRN
  Administered 2021-09-29: 30 mL via PERINEURAL

## 2021-09-29 MED ORDER — ORAL CARE MOUTH RINSE: 15.0000 mL | Freq: Once | OROMUCOSAL | Status: DC

## 2021-09-29 MED ORDER — GLYCOPYRROLATE PF 0.2 MG/ML IJ SOSY
PREFILLED_SYRINGE | INTRAMUSCULAR | Status: DC | PRN
  Administered 2021-09-29: .2 mg via INTRAVENOUS

## 2021-09-29 MED ORDER — CEFAZOLIN SODIUM-DEXTROSE 2-4 GM/100ML-% IV SOLN
INTRAVENOUS | Status: AC
  Filled 2021-09-29: qty 100

## 2021-09-29 MED ORDER — BUPIVACAINE HCL (PF) 0.5 % IJ SOLN
INTRAMUSCULAR | Status: DC | PRN
  Administered 2021-09-29: 3 mL via INTRATHECAL

## 2021-09-29 MED ORDER — CHLORHEXIDINE GLUCONATE 0.12 % MT SOLN: 15.0000 mL | Freq: Once | OROMUCOSAL | Status: DC

## 2021-09-29 MED ORDER — OXYCODONE HCL 5 MG PO TABS
5.0000 mg | ORAL_TABLET | ORAL | Status: DC | PRN
  Administered 2021-10-01: 10 mg via ORAL
  Filled 2021-09-29 (×2): qty 2

## 2021-09-29 MED ORDER — TRANEXAMIC ACID 1000 MG/10ML IV SOLN
INTRAVENOUS | Status: DC | PRN
  Administered 2021-09-29: 2000 mg via TOPICAL

## 2021-09-29 MED ORDER — CEFAZOLIN SODIUM-DEXTROSE 2-4 GM/100ML-% IV SOLN
2.0000 g | Freq: Three times a day (TID) | INTRAVENOUS | Status: AC
  Administered 2021-09-29 – 2021-09-30 (×2): 2 g via INTRAVENOUS
  Filled 2021-09-29 (×2): qty 100

## 2021-09-29 MED ORDER — IRRISEPT - 450ML BOTTLE WITH 0.05% CHG IN STERILE WATER, USP 99.95% OPTIME
TOPICAL | Status: DC | PRN
  Administered 2021-09-29: 450 mL via TOPICAL

## 2021-09-29 MED ORDER — MORPHINE SULFATE (PF) 4 MG/ML IV SOLN
INTRAVENOUS | Status: DC | PRN
  Administered 2021-09-29: 8 mg via INTRAMUSCULAR

## 2021-09-29 MED ORDER — ONDANSETRON HCL 4 MG/2ML IJ SOLN
INTRAMUSCULAR | Status: AC
  Filled 2021-09-29: qty 2

## 2021-09-29 MED ORDER — TRANEXAMIC ACID-NACL 1000-0.7 MG/100ML-% IV SOLN
1000.0000 mg | INTRAVENOUS | Status: AC
  Administered 2021-09-29: 1000 mg via INTRAVENOUS

## 2021-09-29 MED ORDER — ACETAMINOPHEN 500 MG PO TABS
1000.0000 mg | ORAL_TABLET | Freq: Four times a day (QID) | ORAL | Status: AC
  Administered 2021-09-29 – 2021-09-30 (×4): 1000 mg via ORAL
  Filled 2021-09-29 (×4): qty 2

## 2021-09-29 MED ORDER — POVIDONE-IODINE 10 % EX SWAB: 2.0000 "application " | Freq: Once | CUTANEOUS | Status: AC

## 2021-09-29 MED ORDER — MENTHOL 3 MG MT LOZG: 1.0000 | LOZENGE | OROMUCOSAL | Status: DC | PRN

## 2021-09-29 MED ORDER — LIDOCAINE 2% (20 MG/ML) 5 ML SYRINGE
INTRAMUSCULAR | Status: AC
  Filled 2021-09-29: qty 5

## 2021-09-29 MED ORDER — BUPIVACAINE HCL (PF) 0.25 % IJ SOLN
INTRAMUSCULAR | Status: AC
  Filled 2021-09-29: qty 10

## 2021-09-29 MED ORDER — MORPHINE SULFATE (PF) 4 MG/ML IV SOLN
INTRAVENOUS | Status: AC
  Filled 2021-09-29: qty 2

## 2021-09-29 MED ORDER — VANCOMYCIN HCL 1000 MG IV SOLR
INTRAVENOUS | Status: AC
  Filled 2021-09-29: qty 20

## 2021-09-29 MED ORDER — MIDAZOLAM HCL 2 MG/2ML IJ SOLN: 1.0000 mg | Freq: Once | INTRAMUSCULAR | Status: AC

## 2021-09-29 MED ORDER — BUPIVACAINE LIPOSOME 1.3 % IJ SUSP
INTRAMUSCULAR | Status: AC
  Filled 2021-09-29: qty 20

## 2021-09-29 MED ORDER — PROPOFOL 10 MG/ML IV BOLUS
INTRAVENOUS | Status: DC | PRN
  Administered 2021-09-29: 20 mg via INTRAVENOUS
  Administered 2021-09-29: 10 mg via INTRAVENOUS
  Administered 2021-09-29 (×2): 20 mg via INTRAVENOUS

## 2021-09-29 MED ORDER — CLONIDINE HCL (ANALGESIA) 100 MCG/ML EP SOLN
EPIDURAL | Status: AC
  Filled 2021-09-29: qty 10

## 2021-09-29 MED ORDER — CLONIDINE HCL (ANALGESIA) 100 MCG/ML EP SOLN
EPIDURAL | Status: DC | PRN
  Administered 2021-09-29: 100 ug via INTRA_ARTICULAR

## 2021-09-29 MED ORDER — METOCLOPRAMIDE HCL 5 MG PO TABS: 5.0000 mg | ORAL_TABLET | Freq: Three times a day (TID) | ORAL | Status: DC | PRN

## 2021-09-29 MED ORDER — GLYCOPYRROLATE PF 0.2 MG/ML IJ SOSY
PREFILLED_SYRINGE | INTRAMUSCULAR | Status: AC
  Filled 2021-09-29: qty 1

## 2021-09-29 MED ORDER — CELECOXIB 100 MG PO CAPS
100.0000 mg | ORAL_CAPSULE | Freq: Two times a day (BID) | ORAL | Status: DC
  Administered 2021-09-29 – 2021-10-01 (×4): 100 mg via ORAL
  Filled 2021-09-29 (×4): qty 1

## 2021-09-29 MED ORDER — POVIDONE-IODINE 10 % EX SWAB
2.0000 "application " | Freq: Once | CUTANEOUS | Status: AC
  Administered 2021-09-29: 2 via TOPICAL

## 2021-09-29 MED ORDER — BUPIVACAINE LIPOSOME 1.3 % IJ SUSP
INTRAMUSCULAR | Status: DC | PRN
  Administered 2021-09-29: 20 mL

## 2021-09-29 MED ORDER — DOCUSATE SODIUM 100 MG PO CAPS
100.0000 mg | ORAL_CAPSULE | Freq: Two times a day (BID) | ORAL | Status: DC
  Administered 2021-09-29 – 2021-09-30 (×3): 100 mg via ORAL
  Filled 2021-09-29 (×3): qty 1

## 2021-09-29 MED ORDER — ACETAMINOPHEN 500 MG PO TABS
ORAL_TABLET | ORAL | Status: AC
  Administered 2021-09-29: 1000 mg via ORAL
  Filled 2021-09-29: qty 2

## 2021-09-29 MED ORDER — 0.9 % SODIUM CHLORIDE (POUR BTL) OPTIME
TOPICAL | Status: DC | PRN
  Administered 2021-09-29: 1000 mL

## 2021-09-29 MED ORDER — HYDROMORPHONE HCL 1 MG/ML IJ SOLN
0.5000 mg | INTRAMUSCULAR | Status: DC | PRN
  Administered 2021-09-30 – 2021-10-01 (×3): 1 mg via INTRAVENOUS
  Filled 2021-09-29 (×4): qty 1

## 2021-09-29 MED ORDER — FENTANYL CITRATE (PF) 250 MCG/5ML IJ SOLN
INTRAMUSCULAR | Status: AC
  Filled 2021-09-29: qty 5

## 2021-09-29 MED ORDER — ONDANSETRON HCL 4 MG PO TABS: 4.0000 mg | ORAL_TABLET | Freq: Four times a day (QID) | ORAL | Status: DC | PRN

## 2021-09-29 MED ORDER — AMISULPRIDE (ANTIEMETIC) 5 MG/2ML IV SOLN: 10.0000 mg | Freq: Once | INTRAVENOUS | Status: DC | PRN

## 2021-09-29 MED ORDER — ACETAMINOPHEN 500 MG PO TABS: 1000.0000 mg | ORAL_TABLET | Freq: Once | ORAL | Status: AC

## 2021-09-29 MED ORDER — PROPOFOL 1000 MG/100ML IV EMUL
INTRAVENOUS | Status: AC
  Filled 2021-09-29: qty 100

## 2021-09-29 MED ORDER — LACTATED RINGERS IV SOLN: INTRAVENOUS | Status: DC

## 2021-09-29 MED ORDER — ONDANSETRON HCL 4 MG/2ML IJ SOLN: 4.0000 mg | Freq: Once | INTRAMUSCULAR | Status: DC | PRN

## 2021-09-29 MED ORDER — HYDROMORPHONE HCL 1 MG/ML IJ SOLN: 0.2500 mg | INTRAMUSCULAR | Status: DC | PRN

## 2021-09-29 MED ORDER — ACETAMINOPHEN 325 MG PO TABS
325.0000 mg | ORAL_TABLET | Freq: Four times a day (QID) | ORAL | Status: DC | PRN
  Administered 2021-09-30: 650 mg via ORAL
  Filled 2021-09-29: qty 2

## 2021-09-29 MED ORDER — DEXAMETHASONE SODIUM PHOSPHATE 10 MG/ML IJ SOLN
INTRAMUSCULAR | Status: DC | PRN
  Administered 2021-09-29: 10 mg

## 2021-09-29 MED ORDER — ASPIRIN 81 MG PO CHEW
81.0000 mg | CHEWABLE_TABLET | Freq: Two times a day (BID) | ORAL | Status: DC
  Administered 2021-09-29 – 2021-10-01 (×4): 81 mg via ORAL
  Filled 2021-09-29 (×4): qty 1

## 2021-09-29 MED ORDER — POVIDONE-IODINE 7.5 % EX SOLN
Freq: Once | CUTANEOUS | Status: DC
  Filled 2021-09-29: qty 118

## 2021-09-29 MED ORDER — CEFAZOLIN SODIUM-DEXTROSE 2-4 GM/100ML-% IV SOLN
2.0000 g | INTRAVENOUS | Status: AC
  Administered 2021-09-29: 2 g via INTRAVENOUS

## 2021-09-29 MED ORDER — METHOCARBAMOL 1000 MG/10ML IJ SOLN
500.0000 mg | Freq: Four times a day (QID) | INTRAVENOUS | Status: DC | PRN
  Filled 2021-09-29: qty 5

## 2021-09-29 MED ORDER — SODIUM CHLORIDE 0.9 % IV SOLN: INTRAVENOUS | Status: DC

## 2021-09-29 MED ORDER — SERTRALINE HCL 100 MG PO TABS
100.0000 mg | ORAL_TABLET | Freq: Every day | ORAL | Status: DC
Start: 2021-09-30 — End: 2021-10-01
  Administered 2021-09-30 – 2021-10-01 (×2): 100 mg via ORAL
  Filled 2021-09-29 (×2): qty 1

## 2021-09-29 MED ORDER — MIDAZOLAM HCL 2 MG/2ML IJ SOLN
INTRAMUSCULAR | Status: AC
  Administered 2021-09-29: 1 mg via INTRAVENOUS
  Filled 2021-09-29: qty 2

## 2021-09-29 MED ORDER — TRANEXAMIC ACID-NACL 1000-0.7 MG/100ML-% IV SOLN
INTRAVENOUS | Status: AC
Start: 2021-09-29 — End: 2021-09-29
  Filled 2021-09-29: qty 100

## 2021-09-29 MED ORDER — SODIUM CHLORIDE 0.9 % IR SOLN
Status: DC | PRN
  Administered 2021-09-29: 6000 mL

## 2021-09-29 MED ORDER — SODIUM CHLORIDE 0.9% FLUSH
INTRAVENOUS | Status: DC | PRN
  Administered 2021-09-29: 10 mL

## 2021-09-29 MED ORDER — BUPIVACAINE HCL 0.25 % IJ SOLN
INTRAMUSCULAR | Status: DC | PRN
  Administered 2021-09-29: 30 mL via INTRA_ARTICULAR

## 2021-09-29 MED ORDER — OXYCODONE HCL 5 MG/5ML PO SOLN: 5.0000 mg | Freq: Once | ORAL | Status: DC | PRN

## 2021-09-29 MED ORDER — OXYCODONE HCL 5 MG PO TABS: 5.0000 mg | ORAL_TABLET | Freq: Once | ORAL | Status: DC | PRN

## 2021-09-29 MED ORDER — METOCLOPRAMIDE HCL 5 MG/ML IJ SOLN: 5.0000 mg | Freq: Three times a day (TID) | INTRAMUSCULAR | Status: DC | PRN

## 2021-09-29 MED ORDER — CHLORHEXIDINE GLUCONATE 0.12 % MT SOLN
OROMUCOSAL | Status: AC
  Filled 2021-09-29: qty 15

## 2021-09-29 MED ORDER — FENTANYL CITRATE (PF) 100 MCG/2ML IJ SOLN
INTRAMUSCULAR | Status: AC
  Administered 2021-09-29: 50 ug via INTRAVENOUS
  Filled 2021-09-29: qty 2

## 2021-09-29 MED ORDER — FENTANYL CITRATE (PF) 250 MCG/5ML IJ SOLN
INTRAMUSCULAR | Status: DC | PRN
  Administered 2021-09-29 (×2): 50 ug via INTRAVENOUS

## 2021-09-29 MED ORDER — FENTANYL CITRATE (PF) 100 MCG/2ML IJ SOLN: 50.0000 ug | Freq: Once | INTRAMUSCULAR | Status: AC

## 2021-09-29 MED ORDER — METHOCARBAMOL 500 MG PO TABS
500.0000 mg | ORAL_TABLET | Freq: Four times a day (QID) | ORAL | Status: DC | PRN
  Administered 2021-09-30 – 2021-10-01 (×2): 500 mg via ORAL
  Filled 2021-09-29 (×3): qty 1

## 2021-09-29 MED ORDER — ONDANSETRON HCL 4 MG/2ML IJ SOLN
4.0000 mg | Freq: Four times a day (QID) | INTRAMUSCULAR | Status: DC | PRN
  Administered 2021-10-01: 4 mg via INTRAVENOUS
  Filled 2021-09-29: qty 2

## 2021-09-29 MED ORDER — PHENOL 1.4 % MT LIQD: 1.0000 | OROMUCOSAL | Status: DC | PRN

## 2021-09-29 SURGICAL SUPPLY — 76 items
BAG COUNTER SPONGE SURGICOUNT (BAG) ×2 IMPLANT
BAG DECANTER FOR FLEXI CONT (MISCELLANEOUS) ×2 IMPLANT
BANDAGE ESMARK 6X9 LF (GAUZE/BANDAGES/DRESSINGS) ×1 IMPLANT
BLADE SAG 18X100X1.27 (BLADE) ×2 IMPLANT
BLADE SAGITTAL (BLADE) ×2
BLADE SAW THK.89X75X18XSGTL (BLADE) ×1 IMPLANT
BNDG COHESIVE 6X5 TAN STRL LF (GAUZE/BANDAGES/DRESSINGS) ×2 IMPLANT
BNDG ELASTIC 6X15 VLCR STRL LF (GAUZE/BANDAGES/DRESSINGS) ×2 IMPLANT
BNDG ESMARK 6X9 LF (GAUZE/BANDAGES/DRESSINGS) ×2
CNTNR URN SCR LID CUP LEK RST (MISCELLANEOUS) ×1 IMPLANT
COMP FEM KNEE PS NRW 8 LT (Joint) ×2 IMPLANT
COMP TIB KNEE PS 0D LT (Joint) ×2 IMPLANT
COMPONENT FEM KNEE PS NRW 8 LT (Joint) IMPLANT
COMPONENT TIB KNEE PS 0D LT (Joint) IMPLANT
CONT SPEC 4OZ STRL OR WHT (MISCELLANEOUS) ×2
COVER SURGICAL LIGHT HANDLE (MISCELLANEOUS) ×2 IMPLANT
CUFF TOURN SGL QUICK 34 (TOURNIQUET CUFF) ×2
CUFF TRNQT CYL 34X4.125X (TOURNIQUET CUFF) ×1 IMPLANT
DECANTER SPIKE VIAL GLASS SM (MISCELLANEOUS) ×2 IMPLANT
DRAPE INCISE IOBAN 66X45 STRL (DRAPES) ×1 IMPLANT
DRAPE ORTHO SPLIT 77X108 STRL (DRAPES) ×6
DRAPE SURG ORHT 6 SPLT 77X108 (DRAPES) ×3 IMPLANT
DRAPE U-SHAPE 47X51 STRL (DRAPES) ×2 IMPLANT
DRSG AQUACEL AG ADV 3.5X14 (GAUZE/BANDAGES/DRESSINGS) ×2 IMPLANT
DURAPREP 26ML APPLICATOR (WOUND CARE) ×4 IMPLANT
ELECT CAUTERY BLADE 6.4 (BLADE) ×2 IMPLANT
ELECT REM PT RETURN 9FT ADLT (ELECTROSURGICAL) ×2
ELECTRODE REM PT RTRN 9FT ADLT (ELECTROSURGICAL) ×1 IMPLANT
GAUZE SPONGE 4X4 12PLY STRL (GAUZE/BANDAGES/DRESSINGS) ×1 IMPLANT
GLOVE BIOGEL PI IND STRL 7.0 (GLOVE) ×1 IMPLANT
GLOVE BIOGEL PI IND STRL 8 (GLOVE) ×1 IMPLANT
GLOVE BIOGEL PI INDICATOR 7.0 (GLOVE) ×1
GLOVE BIOGEL PI INDICATOR 8 (GLOVE) ×1
GLOVE ECLIPSE 7.0 STRL STRAW (GLOVE) ×2 IMPLANT
GLOVE ECLIPSE 8.0 STRL XLNG CF (GLOVE) ×2 IMPLANT
GLOVE SURG ENC MOIS LTX SZ6.5 (GLOVE) ×6 IMPLANT
GOWN STRL REUS W/ TWL LRG LVL3 (GOWN DISPOSABLE) ×3 IMPLANT
GOWN STRL REUS W/TWL LRG LVL3 (GOWN DISPOSABLE) ×6
HANDPIECE INTERPULSE COAX TIP (DISPOSABLE) ×2
HDLS TROCR DRIL PIN KNEE 75 (PIN) ×2
HOOD PEEL AWAY FLYTE STAYCOOL (MISCELLANEOUS) ×6 IMPLANT
IMMOBILIZER KNEE 22 UNIV (SOFTGOODS) ×2 IMPLANT
IMPL PATELLA METAL SZ32X10 (Joint) ×1 IMPLANT
KIT BASIN OR (CUSTOM PROCEDURE TRAY) ×2 IMPLANT
KIT TURNOVER KIT B (KITS) ×2 IMPLANT
MANIFOLD NEPTUNE II (INSTRUMENTS) ×2 IMPLANT
NDL SPNL 18GX3.5 QUINCKE PK (NEEDLE) ×1 IMPLANT
NEEDLE 22X1 1/2 (OR ONLY) (NEEDLE) ×4 IMPLANT
NEEDLE SPNL 18GX3.5 QUINCKE PK (NEEDLE) ×2 IMPLANT
NS IRRIG 1000ML POUR BTL (IV SOLUTION) ×4 IMPLANT
PACK TOTAL JOINT (CUSTOM PROCEDURE TRAY) ×2 IMPLANT
PAD ARMBOARD 7.5X6 YLW CONV (MISCELLANEOUS) ×4 IMPLANT
PAD CAST 4YDX4 CTTN HI CHSV (CAST SUPPLIES) ×1 IMPLANT
PADDING CAST COTTON 4X4 STRL (CAST SUPPLIES) ×2
PADDING CAST COTTON 6X4 STRL (CAST SUPPLIES) ×2 IMPLANT
PIN DRILL HDLS TROCAR 75 4PK (PIN) IMPLANT
SCREW FEMALE HEX FIX 25X2.5 (ORTHOPEDIC DISPOSABLE SUPPLIES) ×1 IMPLANT
SET HNDPC FAN SPRY TIP SCT (DISPOSABLE) ×1 IMPLANT
STEM TIBIAL 10 8-11 EF POLY LT (Joint) ×1 IMPLANT
STRIP CLOSURE SKIN 1/2X4 (GAUZE/BANDAGES/DRESSINGS) ×4 IMPLANT
SUCTION FRAZIER HANDLE 10FR (MISCELLANEOUS) ×2
SUCTION TUBE FRAZIER 10FR DISP (MISCELLANEOUS) ×1 IMPLANT
SUT MNCRL AB 3-0 PS2 18 (SUTURE) ×2 IMPLANT
SUT VIC AB 0 CT1 27 (SUTURE) ×6
SUT VIC AB 0 CT1 27XBRD ANBCTR (SUTURE) ×3 IMPLANT
SUT VIC AB 1 CT1 27 (SUTURE) ×8
SUT VIC AB 1 CT1 27XBRD ANBCTR (SUTURE) IMPLANT
SUT VIC AB 1 CT1 36 (SUTURE) ×10 IMPLANT
SUT VIC AB 2-0 CT1 27 (SUTURE) ×8
SUT VIC AB 2-0 CT1 TAPERPNT 27 (SUTURE) ×4 IMPLANT
SYR 30ML LL (SYRINGE) ×6 IMPLANT
SYR TB 1ML LUER SLIP (SYRINGE) ×2 IMPLANT
TOWEL GREEN STERILE (TOWEL DISPOSABLE) ×4 IMPLANT
TOWEL GREEN STERILE FF (TOWEL DISPOSABLE) ×4 IMPLANT
TRAY CATH 16FR W/PLASTIC CATH (SET/KITS/TRAYS/PACK) ×1 IMPLANT
YANKAUER SUCT BULB TIP NO VENT (SUCTIONS) ×2 IMPLANT

## 2021-09-29 NOTE — Anesthesia Postprocedure Evaluation (Signed)
Anesthesia Post Note  Patient: Allison Cox  Procedure(s) Performed: LEFT TOTAL KNEE ARTHROPLASTY (Left: Knee)     Patient location during evaluation: PACU Anesthesia Type: Regional and Spinal Level of consciousness: awake Pain management: pain level controlled Vital Signs Assessment: post-procedure vital signs reviewed and stable Respiratory status: spontaneous breathing, nonlabored ventilation, respiratory function stable and patient connected to nasal cannula oxygen Cardiovascular status: stable and blood pressure returned to baseline Postop Assessment: no apparent nausea or vomiting Anesthetic complications: no   No notable events documented.  Last Vitals:  Vitals:   09/29/21 1930 09/29/21 2001  BP:  125/67  Pulse: (!) 53 (!) 52  Resp: 19 17  Temp:  (!) 36.3 C  SpO2: 95% 99%    Last Pain:  Vitals:   09/29/21 2001  TempSrc: Oral  PainSc:                  Karyl Kinnier Ladajah Soltys

## 2021-09-29 NOTE — Progress Notes (Signed)
Orthopedic Tech Progress Note Patient Details:  Allison Cox 18-Oct-1945 [id number]  CPM Left Knee CPM Left Knee: On Left Knee Flexion (Degrees): 10 Left Knee Extension (Degrees): 40  Post Interventions Patient Tolerated: Well  Edwina Barth 09/29/2021, 7:26 PM

## 2021-09-29 NOTE — Anesthesia Procedure Notes (Signed)
Anesthesia Regional Block: Adductor canal block   Pre-Anesthetic Checklist: , timeout performed,  Correct Patient, Correct Site, Correct Laterality,  Correct Procedure, Correct Position, site marked,  Risks and benefits discussed,  Surgical consent,  Pre-op evaluation,  At surgeon's request and post-op pain management  Laterality: Left  Prep: Maximum Sterile Barrier Precautions used, chloraprep       Needles:  Injection technique: Single-shot  Needle Type: Echogenic Stimulator Needle     Needle Length: 9cm  Needle Gauge: 22     Additional Needles:   Procedures:,,,, ultrasound used (permanent image in chart),,    Narrative:  Start time: 09/29/2021 2:20 PM End time: 09/29/2021 2:30 PM Injection made incrementally with aspirations every 5 mL.  Performed by: Personally  Anesthesiologist: Pervis Hocking, DO  Additional Notes: Monitors applied. No increased pain on injection. No increased resistance to injection. Injection made in 5cc increments. Good needle visualization. Patient tolerated procedure well.

## 2021-09-29 NOTE — Anesthesia Preprocedure Evaluation (Addendum)
Anesthesia Evaluation  Patient identified by MRN, date of birth, ID band Patient awake    Reviewed: Allergy & Precautions, NPO status , Patient's Chart, lab work & pertinent test results  Airway Mallampati: III  TM Distance: >3 FB Neck ROM: Full    Dental  (+) Teeth Intact, Dental Advisory Given   Pulmonary former smoker,  Quit smoking 1988, >1ppd x 20 years   Pulmonary exam normal breath sounds clear to auscultation       Cardiovascular hypertension (146/65 in preop, per pt normally 120/70- no meds), Normal cardiovascular exam Rhythm:Regular Rate:Normal     Neuro/Psych PSYCHIATRIC DISORDERS Anxiety negative neurological ROS     GI/Hepatic negative GI ROS, Neg liver ROS,   Endo/Other  Obesity BMI 38  Renal/GU negative Renal ROS  negative genitourinary   Musculoskeletal  (+) Arthritis , Osteoarthritis,    Abdominal (+) + obese,   Peds  Hematology negative hematology ROS (+) Hb 13.8, plt 209   Anesthesia Other Findings   Reproductive/Obstetrics negative OB ROS                           Anesthesia Physical Anesthesia Plan  ASA: 2  Anesthesia Plan: Regional, Spinal and MAC   Post-op Pain Management: Regional block* and Tylenol PO (pre-op)*   Induction: Intravenous  PONV Risk Score and Plan: 2 and Ondansetron, Midazolam, Treatment may vary due to age or medical condition, Propofol infusion and TIVA  Airway Management Planned: Natural Airway and Simple Face Mask  Additional Equipment: None  Intra-op Plan:   Post-operative Plan: Extubation in OR  Informed Consent: I have reviewed the patients History and Physical, chart, labs and discussed the procedure including the risks, benefits and alternatives for the proposed anesthesia with the patient or authorized representative who has indicated his/her understanding and acceptance.     Dental advisory given  Plan Discussed with:  CRNA  Anesthesia Plan Comments:      Anesthesia Quick Evaluation

## 2021-09-29 NOTE — Brief Op Note (Signed)
   09/29/2021  5:50 PM  PATIENT:  Alvina Filbert  76 y.o. female  PRE-OPERATIVE DIAGNOSIS:  left knee arthritis POST-OPERATIVE DIAGNOSIS:  left knee osteoarthritis  PROCEDURE:  Procedure(s): LEFT TOTAL KNEE ARTHROPLASTY  SURGEON:  Surgeon(s): Meredith Pel, MD  ASSISTANT: magnant pa  ANESTHESIA:   spinal  EBL: 100 ml    Total I/O In: 300 [I.V.:300] Out: -   BLOOD ADMINISTERED: none  DRAINS: none   LOCAL MEDICATIONS USED:  marcaine mso4 clonidine vanco  SPECIMEN:  No Specimen  COUNTS:  YES  TOURNIQUET:   Total Tourniquet Time Documented: Thigh (Left) - 80 minutes Total: Thigh (Left) - 80 minutes   DICTATION: .Other Dictation: Dictation Number pend  PLAN OF CARE: Admit for overnight observation  PATIENT DISPOSITION:  PACU - hemodynamically stable

## 2021-09-29 NOTE — Progress Notes (Signed)
Pt arrived to [place], alert and oriented x 4, identified appropriately.  VS stable, no signs of acute distress, denied chest pain and SOB. Pt oriented to room and equipment. Instructed to use call bell for assistance, and call bell left within reach.  Pt currently using CPM without issues.  Will continue to monitor and treat pt per MD orders.

## 2021-09-29 NOTE — Anesthesia Procedure Notes (Signed)
Spinal  Patient location during procedure: OR Start time: 09/29/2021 3:32 PM End time: 09/29/2021 3:35 PM Reason for block: surgical anesthesia Staffing Performed: anesthesiologist  Anesthesiologist: Pervis Hocking, DO Preanesthetic Checklist Completed: patient identified, IV checked, risks and benefits discussed, surgical consent, monitors and equipment checked, pre-op evaluation and timeout performed Spinal Block Patient position: sitting Prep: DuraPrep and site prepped and draped Patient monitoring: cardiac monitor, continuous pulse ox and blood pressure Approach: midline Location: L3-4 Injection technique: single-shot Needle Needle type: Pencan  Needle gauge: 24 G Needle length: 9 cm Assessment Sensory level: T6 Events: CSF return Additional Notes Functioning IV was confirmed and monitors were applied. Sterile prep and drape, including hand hygiene and sterile gloves were used. The patient was positioned and the spine was prepped. The skin was anesthetized with lidocaine.  Free flow of clear CSF was obtained prior to injecting local anesthetic into the CSF.  The spinal needle aspirated freely following injection.  The needle was carefully withdrawn.  The patient tolerated the procedure well.

## 2021-09-29 NOTE — Transfer of Care (Deleted)
Immediate Anesthesia Transfer of Care Note  Patient: Allison Cox  Procedure(s) Performed: LEFT TOTAL KNEE ARTHROPLASTY (Left: Knee)  Patient Location: PACU  Anesthesia Type:General and Regional  Level of Consciousness: awake, drowsy, patient cooperative and responds to stimulation  Airway & Oxygen Therapy: Patient Spontanous Breathing and Patient connected to nasal cannula oxygen  Post-op Assessment: Report given to RN and Post -op Vital signs reviewed and stable  Post vital signs: Reviewed and stable  Last Vitals:  Vitals Value Taken Time  BP 121/62 09/29/21 1445  Temp    Pulse 60 09/29/21 1450  Resp 14 09/29/21 1450  SpO2 99 % 09/29/21 1450  Vitals shown include unvalidated device data.  Last Pain:  Vitals:   09/29/21 1342  TempSrc:   PainSc: 3       Patients Stated Pain Goal: 1 (19/50/93 2671)  Complications: No notable events documented.

## 2021-09-29 NOTE — H&P (Signed)
TOTAL KNEE ADMISSION H&P  Patient is being admitted for left total knee arthroplasty.  Subjective:  Chief Complaint:left knee pain.  HPI: Allison Cox, 76 y.o. female, has a history of pain and functional disability in the left knee due to arthritis and has failed non-surgical conservative treatments for greater than 12 weeks to includeNSAID's and/or analgesics, corticosteriod injections, flexibility and strengthening excercises, and activity modification.  Onset of symptoms was gradual, starting 8 years ago with gradually worsening course since that time. The patient noted no past surgery on the left knee(s).  Patient currently rates pain in the left knee(s) at 8 out of 10 with activity. Patient has night pain, worsening of pain with activity and weight bearing, pain that interferes with activities of daily living, pain with passive range of motion, crepitus, and joint swelling.  Patient has evidence of subchondral sclerosis and joint space narrowing by imaging studies. This patient has had  a long course of nonoperative treatment and really does not want to do serial injections.  No personal or family history of DVT or pulmonary embolism. . There is no active infection.  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   LOW BACK PAIN, CHRONIC 08/11/2009   COLONIC POLYPS, HYPERPLASTIC, HX OF 08/17/2008   Acute upper respiratory infection 04/17/2008   SINUSITIS- ACUTE-NOS 03/30/2008   Hyperlipidemia 09/26/2007   Anxiety state 01/01/2007   CYST, SEBACEOUS 01/01/2007   OSTEOPENIA  01/01/2007   ARTHROSCOPY, RIGHT KNEE, HX OF 01/01/2007   Past Medical History:  Diagnosis Date   Anxiety    Arthritis    knee   Colon polyps    hyperplastic and Adenomatous   Hyperlipidemia    Osteopenia     Past Surgical History:  Procedure Laterality Date   CARPAL TUNNEL RELEASE Left    COLONOSCOPY  07/2012   Henrene Pastor - polyps   KNEE ARTHROSCOPY Right    WISDOM TOOTH EXTRACTION      Current Facility-Administered Medications  Medication Dose Route Frequency Provider Last Rate Last Admin   ceFAZolin (ANCEF) 2-4 GM/100ML-% IVPB            [START ON 09/30/2021] ceFAZolin (ANCEF) IVPB 2g/100 mL premix  2 g Intravenous On Call to OR Magnant, Charles L, PA-C       chlorhexidine (PERIDEX) 0.12 % solution 15 mL  15 mL Mouth/Throat Once Finucane, Elizabeth M, DO       Or   MEDLINE mouth rinse  15 mL Mouth Rinse Once Finucane, Elizabeth M, DO       chlorhexidine (PERIDEX) 0.12 % solution            lactated ringers infusion   Intravenous Continuous Finucane, Elizabeth M, DO       povidone-iodine (BETADINE) 7.5 % scrub   Topical Once Magnant, Charles L, PA-C       tranexamic acid (CYKLOKAPRON) '1000MG'$ /133m IVPB            tranexamic acid (CYKLOKAPRON) 2,000 mg in sodium chloride 0.9 % 50 mL Topical Application  27,425mg [facility]  tranexamic acid (CYKLOKAPRON) IVPB 1,000 mg  1,000 mg Intravenous To OR Magnant, Charles L, PA-C       Allergies  Allergen Reactions   Codeine Nausea And Vomiting    Social History   Tobacco Use   Smoking status: Former    Types: Cigarettes    Quit date: 05/15/1986    Years since quitting: 35.4   Smokeless tobacco: Never  Substance Use Topics   Alcohol use: Yes    Alcohol/week: 3.0 - 4.0 standard drinks    Types: 3 - 4 Glasses of wine per week    Family History  Problem Relation Age of Onset   Coronary artery disease Other    Stroke Mother    Heart attack Father    Colon cancer Neg Hx    Rectal cancer Neg Hx     Stomach cancer Neg Hx      Review of Systems  Musculoskeletal:  Positive for arthralgias.  All other systems reviewed and are negative.  Objective:  Physical Exam Vitals reviewed.  HENT:     Head: Normocephalic.     Nose: Nose normal.     Mouth/Throat:     Mouth: Mucous membranes are moist.  Eyes:     Pupils: Pupils are equal, round, and reactive to light.  Cardiovascular:     Rate and Rhythm: Normal rate.     Pulses: Normal pulses.  Pulmonary:     Effort: Pulmonary effort is normal.  Abdominal:     General: Abdomen is flat.  Musculoskeletal:     Cervical back: Normal range of motion.  Skin:    General: Skin is warm.     Capillary Refill: Capillary refill takes less than 2 seconds.  Neurological:     General: No focal deficit present.     Mental Status: She is alert.  Psychiatric:        Mood and Affect: Mood normal.  Left knee has range of motion 5-1 15 with positive patellar grinding.  Collaterals are stable.  Extensor mechanism intact.  Good cap refill in both feet with ABIs preoperatively 1.2.  No groin pain with internal/external Tatian of the leg.  Vital signs in last 24 hours: Temp:  [98.4 F (36.9 C)] 98.4 F (36.9 C) (05/18 1334) Pulse Rate:  [66-77] 66 (05/18 1440) Resp:  [15-18] 15 (05/18 1440) BP: (114-146)/(63-66) 137/64 (05/18 1440) SpO2:  [97 %-99 %] 99 % (05/18 1440) Weight:  [108.9 kg] 108.9 kg (05/18 1334)  Labs:   Estimated body mass index is 37.59 kg/m as calculated from the following:   Height as of this encounter: '5\' 7"'$  (1.702 m).   Weight as of this encounter: 108.9 kg.   Imaging Review Plain radiographs demonstrate moderate degenerative joint disease of the left knee(s). The overall alignment isneutral. The bone quality appears to be good for age and reported activity level.      Assessment/Plan:  End stage arthritis, left knee   The patient history, physical examination, clinical judgment of the provider and imaging studies  are consistent with end stage degenerative joint disease of the left knee(s) and total knee arthroplasty is deemed medically necessary. The treatment options including medical management, injection therapy arthroscopy and arthroplasty were discussed at length. The risks and benefits of total knee arthroplasty were presented and reviewed. The risks due to aseptic loosening, infection, stiffness, patella tracking problems, thromboembolic complications and other imponderables were discussed. The patient acknowledged the explanation, agreed to proceed with the plan and consent was  signed. Patient is being admitted for inpatient treatment for surgery, pain control, PT, OT, prophylactic antibiotics, VTE prophylaxis, progressive ambulation and ADL's and discharge planning. The patient is planning to be discharged home with home health services     Patient's anticipated LOS is less than 2 midnights, meeting these requirements: - Younger than 36 - Lives within 1 hour of care - Has a competent adult at home to recover with post-op recover - NO history of  - Chronic pain requiring opiods  - Diabetes  - Coronary Artery Disease  - Heart failure  - Heart attack  - Stroke  - DVT/VTE  - Cardiac arrhythmia  - Respiratory Failure/COPD  - Renal failure  - Anemia  - Advanced Liver disease

## 2021-09-29 NOTE — Transfer of Care (Signed)
Immediate Anesthesia Transfer of Care Note  Patient: Allison Cox  Procedure(s) Performed: LEFT TOTAL KNEE ARTHROPLASTY (Left: Knee)  Patient Location: PACU  Anesthesia Type:Spinal  Level of Consciousness: awake, alert  and oriented  Airway & Oxygen Therapy: Patient Spontanous Breathing  Post-op Assessment: Report given to RN, Post -op Vital signs reviewed and stable and Patient able to stick tongue midline  Post vital signs: Reviewed  Last Vitals:  Vitals Value Taken Time  BP 136/67 09/29/21 1825  Temp 97.7   Pulse 79 09/29/21 1825  Resp 19 09/29/21 1825  SpO2 92 % 09/29/21 1825  Vitals shown include unvalidated device data.  Last Pain:  Vitals:   09/29/21 1342  TempSrc:   PainSc: 3       Patients Stated Pain Goal: 1 (29/29/09 0301)  Complications: No notable events documented.

## 2021-09-30 ENCOUNTER — Other Ambulatory Visit: Payer: Self-pay

## 2021-09-30 DIAGNOSIS — Z87891 Personal history of nicotine dependence: Secondary | ICD-10-CM | POA: Diagnosis not present

## 2021-09-30 DIAGNOSIS — I10 Essential (primary) hypertension: Secondary | ICD-10-CM | POA: Diagnosis not present

## 2021-09-30 DIAGNOSIS — M25562 Pain in left knee: Secondary | ICD-10-CM | POA: Diagnosis not present

## 2021-09-30 DIAGNOSIS — Z79899 Other long term (current) drug therapy: Secondary | ICD-10-CM | POA: Diagnosis not present

## 2021-09-30 DIAGNOSIS — M1712 Unilateral primary osteoarthritis, left knee: Secondary | ICD-10-CM | POA: Diagnosis not present

## 2021-09-30 LAB — CBC
HCT: 34.5 % — ABNORMAL LOW (ref 36.0–46.0)
Hemoglobin: 11.9 g/dL — ABNORMAL LOW (ref 12.0–15.0)
MCH: 29.8 pg (ref 26.0–34.0)
MCHC: 34.5 g/dL (ref 30.0–36.0)
MCV: 86.3 fL (ref 80.0–100.0)
Platelets: 197 10*3/uL (ref 150–400)
RBC: 4 MIL/uL (ref 3.87–5.11)
RDW: 13.8 % (ref 11.5–15.5)
WBC: 9.9 10*3/uL (ref 4.0–10.5)
nRBC: 0 % (ref 0.0–0.2)

## 2021-09-30 MED ORDER — VITAMIN D (ERGOCALCIFEROL) 1.25 MG (50000 UNIT) PO CAPS: 50000.0000 [IU] | ORAL_CAPSULE | ORAL | 1 refills | Status: DC

## 2021-09-30 MED ORDER — ACETAMINOPHEN 325 MG PO TABS: 325.0000 mg | ORAL_TABLET | Freq: Four times a day (QID) | ORAL | 0 refills | Status: DC | PRN

## 2021-09-30 MED ORDER — ASPIRIN 81 MG PO CHEW: 81.0000 mg | CHEWABLE_TABLET | Freq: Every day | ORAL | 0 refills | Status: DC

## 2021-09-30 MED ORDER — METHOCARBAMOL 500 MG PO TABS: 500.0000 mg | ORAL_TABLET | Freq: Four times a day (QID) | ORAL | 0 refills | Status: DC | PRN

## 2021-09-30 MED ORDER — CELECOXIB 100 MG PO CAPS: 100.0000 mg | ORAL_CAPSULE | Freq: Two times a day (BID) | ORAL | 0 refills | Status: DC

## 2021-09-30 MED ORDER — DOCUSATE SODIUM 100 MG PO CAPS: 100.0000 mg | ORAL_CAPSULE | Freq: Two times a day (BID) | ORAL | 0 refills | Status: DC

## 2021-09-30 MED ORDER — OXYCODONE HCL 5 MG PO TABS: 5.0000 mg | ORAL_TABLET | ORAL | 0 refills | Status: DC | PRN

## 2021-09-30 NOTE — Progress Notes (Signed)
  Subjective: Stable.  Pain controlled.  On CPM machine.   Objective: Vital signs in last 24 hours: Temp:  [97.4 F (36.3 C)-98.4 F (36.9 C)] 98 F (36.7 C) (05/19 0400) Pulse Rate:  [52-79] 60 (05/19 0400) Resp:  [14-20] 17 (05/19 0400) BP: (102-146)/(44-69) 107/52 (05/19 0400) SpO2:  [94 %-99 %] 98 % (05/19 0400) Weight:  [108.9 kg] 108.9 kg (05/18 1334)  Intake/Output from previous day: 05/18 0701 - 05/19 0700 In: 1280 [P.O.:480; I.V.:800] Out: 750 [Urine:600; Blood:150] Intake/Output this shift: No intake/output data recorded.  Exam:  Sensation intact distally Intact pulses distally Dorsiflexion/Plantar flexion intact  Labs: Recent Labs    09/30/21 0227  HGB 11.9*   Recent Labs    09/30/21 0227  WBC 9.9  RBC 4.00  HCT 34.5*  PLT 197   No results for input(s): NA, K, CL, CO2, BUN, CREATININE, GLUCOSE, CALCIUM in the last 72 hours. No results for input(s): LABPT, INR in the last 72 hours.  Assessment/Plan: Plan is to see how she does with physical therapy today.  Right now she looks very good in terms of her pain control and ability to do straight leg raises.  If she feels great we can aim for discharge later this afternoon.  If not okay to stay 1 more night and then discharge in a.m.  We will recheck later today to assess how she is doing with therapy and mobilization.   Allison Cox 09/30/2021, 7:57 AM

## 2021-09-30 NOTE — Progress Notes (Signed)
Physical Therapy Treatment Patient Details Name: Allison Cox MRN: [id number] DOB: 1945/12/30 Today's Date: 09/30/2021   History of Present Illness 76 yo female with end stage OA on L knee was admitted 5/18 for TKA.  Pt is WBAT with immob when OOB.    PT Comments    Pt was seen for mobility in bed, with a review of her ROM and strengthening ex's as well as to be able to remove the immobilizer on L knee.  Pt did 15 SLR's with no issue, and was instructed to use brace if needed but should be able to walk without it.  Pt voices understanding of her exercise routine, and will go home with HHPT and friend help to continue rehab.  Follow acutely for PT goals as are outlined on POC.    Recommendations for follow up therapy are one component of a multi-disciplinary discharge planning process, led by the attending physician.  Recommendations may be updated based on patient status, additional functional criteria and insurance authorization.  Follow Up Recommendations  Home health PT     Assistance Recommended at Discharge Intermittent Supervision/Assistance  Patient can return home with the following A little help with walking and/or transfers;A little help with bathing/dressing/bathroom;Assistance with cooking/housework;Assist for transportation;Help with stairs or ramp for entrance   Equipment Recommendations  None recommended by PT    Recommendations for Other Services       Precautions / Restrictions Precautions Precautions: Knee Precaution Booklet Issued: No Precaution Comments: verbally reviewed Required Braces or Orthoses: Knee Immobilizer - Left Knee Immobilizer - Left: Discontinue once straight leg raise with < 10 degree lag Restrictions Weight Bearing Restrictions: Yes Other Position/Activity Restrictions: WBAT     Mobility  Bed Mobility Overal bed mobility: Needs Assistance Bed Mobility: Supine to Sit     Supine to sit: Min guard     General bed mobility  comments: declined OOB    Transfers Overall transfer level: Needs assistance Equipment used: Rolling walker (2 wheels), 1 person hand held assist Transfers: Sit to/from Stand Sit to Stand: Min guard, Min assist           General transfer comment: declined OOB    Ambulation/Gait Ambulation/Gait assistance: Min guard Gait Distance (Feet): 30 Feet Assistive device: Rolling walker (2 wheels), 1 person hand held assist   Gait velocity: reduced Gait velocity interpretation: <1.31 ft/sec, indicative of household ambulator Pre-gait activities: standing balance ck General Gait Details: reciprocal pattern in L knee immobilizer, with pt getting up to walk and sit with cues for brace management   Stairs Stairs: Yes Stairs assistance: Min guard Stair Management: One rail Right, One rail Left, Step to pattern, Forwards Number of Stairs: 10 General stair comments: pt is able to sequence as instructed with good transfer from stair rails to walker   Wheelchair Mobility    Modified Rankin (Stroke Patients Only)       Balance Overall balance assessment: Needs assistance Sitting-balance support: Feet supported Sitting balance-Leahy Scale: Good   Postural control: Posterior lean Standing balance support: Bilateral upper extremity supported, During functional activity Standing balance-Leahy Scale: Fair Standing balance comment: fair in L knee immobilizer                            Cognition Arousal/Alertness: Awake/alert Behavior During Therapy: WFL for tasks assessed/performed Overall Cognitive Status: Within Functional Limits for tasks assessed  General Comments: Pt is motivated to get through her exercises and has already reviewed them        Exercises Total Joint Exercises Ankle Circles/Pumps: AROM, 5 reps Quad Sets: AROM, 10 reps Gluteal Sets: AROM, 10 reps Towel Squeeze: AROM, 10 reps Straight Leg Raises:  AROM, 15 reps    General Comments General comments (skin integrity, edema, etc.): pt reviewed her exercises and discussed with her the quad sets, ankle ROM and control of L hip      Pertinent Vitals/Pain Pain Assessment Pain Assessment: Faces Faces Pain Scale: Hurts little more Pain Location: L knee with ROM exercises    Home Living Family/patient expects to be discharged to:: Private residence Living Arrangements: Alone Available Help at Discharge: Friend(s);Available 24 hours/day Type of Home: House Home Access: Stairs to enter Entrance Stairs-Rails: Psychiatric nurse of Steps: 2   Home Layout: One level Home Equipment: Conservation officer, nature (2 wheels);Rollator (4 wheels);BSC/3in1;Shower seat Additional Comments: Pt is planning to have a friend with her for 2 weeks    Prior Function            PT Goals (current goals can now be found in the care plan section) Acute Rehab PT Goals Patient Stated Goal: to get home with help from friends PT Goal Formulation: With patient Time For Goal Achievement: 10/14/21 Potential to Achieve Goals: Good    Frequency    7X/week      PT Plan      Co-evaluation              AM-PAC PT "6 Clicks" Mobility   Outcome Measure  Help needed turning from your back to your side while in a flat bed without using bedrails?: A Little Help needed moving from lying on your back to sitting on the side of a flat bed without using bedrails?: A Little Help needed moving to and from a bed to a chair (including a wheelchair)?: A Little Help needed standing up from a chair using your arms (e.g., wheelchair or bedside chair)?: A Little Help needed to walk in hospital room?: A Little Help needed climbing 3-5 steps with a railing? : A Little 6 Click Score: 18    End of Session   Activity Tolerance: Patient limited by fatigue;Patient limited by pain Patient left: with call bell/phone within reach;in bed;with bed alarm set Nurse  Communication: Mobility status PT Visit Diagnosis: Unsteadiness on feet (R26.81);Muscle weakness (generalized) (M62.81);Pain Pain - Right/Left: Left Pain - part of body: Knee     Time: 1410-1420 PT Time Calculation (min) (ACUTE ONLY): 10 min  Charges:  $Therapeutic Exercise: 8-22 mins     Ramond Dial 09/30/2021, 4:10 PM  Mee Hives, PT PhD Acute Rehab Dept. Number: [place] and [place]

## 2021-09-30 NOTE — Progress Notes (Signed)
Physician Discharge Summary      Patient ID: Allison Cox MRN: [id number] DOB/AGE: 1945/09/11 76 y.o.  Admit date: 09/29/2021 Discharge date: 09/30/2021  Admission Diagnoses:  Principal Problem:   S/P total knee arthroplasty, left   Discharge Diagnoses:  Same  Surgeries: Procedure(s): LEFT TOTAL KNEE ARTHROPLASTY on 09/29/2021   Consultants:   Discharged Condition: Stable  Hospital Course: Allison Cox is an 76 y.o. 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  They were brought to the operating room on 09/29/2021 and underwent the above named procedures.  Patient did very well with physical therapy and was ambulating on postop day #1 with pain controlled.  She is discharged home in good condition.  She will follow-up with [place] in 2 weeks.  Aspirin for DVT prophylaxis and Percocet for pain along with Robaxin for muscle relaxer.  Antibiotics given:  Anti-infectives (From admission, onward)    Start     Dose/Rate Route Frequency Ordered Stop   09/30/21 0600  ceFAZolin (ANCEF) IVPB 2g/100 mL premix        2 g 200 mL/hr over 30 Minutes Intravenous On call to O.R. 09/29/21 1330 09/30/21 0630   09/29/21 2200  ceFAZolin (ANCEF) IVPB 2g/100 mL premix        2 g 200 mL/hr over 30 Minutes Intravenous Every 8 hours 09/29/21 2000 09/30/21 0705   09/29/21 1334  ceFAZolin (ANCEF) 2-4 GM/100ML-% IVPB  Status:  Discontinued       Note to Pharmacy: Mendel Corning B: cabinet override      09/29/21 1334 09/29/21 1338   09/29/21 1333  ceFAZolin (ANCEF) 2-4 GM/100ML-% IVPB       Note to Pharmacy: Mendel Corning B: cabinet override      09/29/21 1333 09/29/21 1534     .  Recent vital signs:  Vitals:   09/30/21 0808 09/30/21 1532  BP: (!) 100/52 (!) 118/47  Pulse: (!) 52 (!) 57  Resp: 18 18  Temp: 98 F (36.7 C) 97.8 F (36.6 C)  SpO2: 98% 97%    Recent laboratory studies:  Results for orders  placed or performed during the hospital encounter of 09/29/21  CBC  Result Value Ref Range   WBC 9.9 4.0 - 10.5 K/uL   RBC 4.00 3.87 - 5.11 MIL/uL   Hemoglobin 11.9 (L) 12.0 - 15.0 g/dL   HCT 34.5 (L) 36.0 - 46.0 %   MCV 86.3 80.0 - 100.0 fL   MCH 29.8 26.0 - 34.0 pg   MCHC 34.5 30.0 - 36.0 g/dL   RDW 13.8 11.5 - 15.5 %   Platelets 197 150 - 400 K/uL   nRBC 0.0 0.0 - 0.2 %    Discharge Medications:   Allergies as of 09/30/2021       Reactions   Codeine Nausea And Vomiting        Medication List     TAKE these medications    acetaminophen 325 MG tablet Commonly known as: TYLENOL Take 1-2 tablets (325-650 mg total) by mouth every 6 (six) hours as needed for mild pain (pain score 1-3 or temp > 100.5).   aspirin 81 MG chewable tablet Chew 1 tablet (81 mg total) by mouth daily.   atorvastatin 20 MG tablet Commonly known as: LIPITOR Take 1 tablet (20 mg total) by mouth daily.   celecoxib 100 MG capsule Commonly known as: CELEBREX Take 1 capsule (100 mg  total) by mouth 2 (two) times daily.   docusate sodium 100 MG capsule Commonly known as: COLACE Take 1 capsule (100 mg total) by mouth 2 (two) times daily.   LORazepam 0.5 MG tablet Commonly known as: ATIVAN Take 1 tablet (0.5 mg total) by mouth every 8 (eight) hours as needed for anxiety.   methocarbamol 500 MG tablet Commonly known as: ROBAXIN Take 1 tablet (500 mg total) by mouth every 6 (six) hours as needed for muscle spasms.   oxyCODONE 5 MG immediate release tablet Commonly known as: Oxy IR/ROXICODONE Take 1-2 tablets (5-10 mg total) by mouth every 4 (four) hours as needed for moderate pain (pain score 4-6).   sertraline 100 MG tablet Commonly known as: ZOLOFT TAKE 1 & 1/2 (ONE & ONE-HALF) TABLETS BY MOUTH ONCE DAILY   Vitamin D (Ergocalciferol) 1.25 MG (50000 UNIT) Caps capsule Commonly known as: DRISDOL Take 1 capsule (50,000 Units total) by mouth every 7 (seven) days.        Diagnostic Studies:  No results found.  Disposition: Discharge disposition: 01-Home or Self Care       Discharge Instructions     Call MD / Call 911   Complete by: As directed    If you experience chest pain or shortness of breath, CALL 911 and be transported to the hospital emergency room.  If you develope a fever above 101 F, pus (white drainage) or increased drainage or redness at the wound, or calf pain, call your surgeon's office.   Constipation Prevention   Complete by: As directed    Drink plenty of fluids.  Prune juice may be helpful.  You may use a stool softener, such as Colace (over the counter) 100 mg twice a day.  Use MiraLax (over the counter) for constipation as needed.   Diet - low sodium heart healthy   Complete by: As directed    Discharge instructions   Complete by: As directed    CPM machine 1 hour 3 times a day increase degrees daily Okay to shower dressing is waterproof Work on straightening the leg and bending the knee is much as possible Follow-up in clinic 2 weeks   Face-to-face encounter (required for Medicare/Medicaid patients)   Complete by: As directed    I Allison Cox certify that this patient is under my care and that I, or a nurse practitioner or physician's assistant working with me, had a face-to-face encounter that meets the physician face-to-face encounter requirements with this patient on 09/30/2021. The encounter with the patient was in whole, or in part for the following medical condition(s) which is the primary reason for home health care (List medical condition): Left total knee replacement   The encounter with the patient was in whole, or in part, for the following medical condition, which is the primary reason for home health care: Total knee replacement   I certify that, based on my findings, the following services are medically necessary home health services: Physical therapy   Reason for Medically Necessary Home Health Services: Therapy- Therapeutic Exercises to  Increase Strength and Endurance   My clinical findings support the need for the above services: Pain interferes with ambulation/mobility   Further, I certify that my clinical findings support that this patient is homebound due to: Pain interferes with ambulation/mobility   Home Health   Complete by: As directed    To provide the following care/treatments: PT   Increase activity slowly as tolerated   Complete by: As directed  Post-operative opioid taper instructions:   Complete by: As directed    POST-OPERATIVE OPIOID TAPER INSTRUCTIONS: It is important to wean off of your opioid medication as soon as possible. If you do not need pain medication after your surgery it is ok to stop day one. Opioids include: Codeine, Hydrocodone(Norco, Vicodin), Oxycodone(Percocet, oxycontin) and hydromorphone amongst others.  Long term and even short term use of opiods can cause: Increased pain response Dependence Constipation Depression Respiratory depression And more.  Withdrawal symptoms can include Flu like symptoms Nausea, vomiting And more Techniques to manage these symptoms Hydrate well Eat regular healthy meals Stay active Use relaxation techniques(deep breathing, meditating, yoga) Do Not substitute Alcohol to help with tapering If you have been on opioids for less than two weeks and do not have pain than it is ok to stop all together.  Plan to wean off of opioids This plan should start within one week post op of your joint replacement. Maintain the same interval or time between taking each dose and first decrease the dose.  Cut the total daily intake of opioids by one tablet each day Next start to increase the time between doses. The last dose that should be eliminated is the evening dose.           Follow-up Information     [facility] [place] [facility] up.   Specialty: [place] Why: home health PT services will be provided by [facility]  information: [address] [place] [address] [phone]                  Signed: Anderson Cox 09/30/2021, 5:23 PM

## 2021-09-30 NOTE — Progress Notes (Signed)
Pt was seen for evaluation, noted her effort to get through gait and stairs in immobilizer, and was afterward given permission to remove brace if she can do 15 SLR's.  Pt was given a handout of the exercises for LE"s and agreed to meet the PT in PM for review of this information.  Acute PT goals are outlined below, and are prepared with expectation that pt is going home with friends to assist her for safety and basic care.  Continue rehab BID as tolerated, with pt possibly going home later today.  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  Pt is WBAT with immob when OOB.  Precautions  Precautions Knee  Precaution Booklet Issued No  Precaution Comments verbally reviewed  Required Braces or Orthoses Knee Immobilizer - Left  Knee Immobilizer - Left On when out of bed or walking  Restrictions  Weight Bearing Restrictions Yes  Other Position/Activity Restrictions WBAT  Home Living  Family/patient expects to be discharged to: Private residence  Living Arrangements Alone  Available Help at Discharge Friend(s);Available 24 hours/day  Type of Home House  Home Access Stairs to enter  Entrance Stairs-Number of Steps 2  Entrance Stairs-Rails Right;Left  Home Layout One level  Bathroom Shower/Tub Walk-in [employer] forces operational officer (2 wheels);Rollator (4 wheels);BSC/3in1;Shower seat  Additional Comments Pt is planning to have a friend with her for 2 weeks  Prior Function  Prior Level of Function  Independent/Modified Independent  Mobility Comments independent gait on RW at times  ADLs Comments I for all  Communication  Communication No difficulties  Pain Assessment  Pain Assessment Faces  Faces Pain Scale 6  Pain Location L knee on descending steps  Cognition  Arousal/Alertness Awake/alert  Behavior During Therapy  [facility] for tasks assessed/performed  Overall Cognitive Status Within Functional Limits for tasks assessed  General Comments answering history questions accurately  Bed Mobility  Overal bed mobility Needs Assistance  Bed Mobility Supine to Sit  Supine to sit Min guard  General bed mobility comments minor help with linens and lines  Transfers  Overall transfer level Needs assistance  Equipment used Rolling walker (2 wheels);1 person hand held assist  Transfers Sit to/from Stand  Sit to Stand Min guard;Min assist  General transfer comment initially needed more help then became more independent  Ambulation/Gait  Ambulation/Gait assistance Min guard  Gait Distance (Feet) 30 Feet  Assistive device Rolling walker (2 wheels);1 person hand held assist  General Gait Details reciprocal pattern in L knee immobilizer, with pt getting up to walk and sit with cues for brace management  Gait velocity reduced  Gait velocity interpretation <1.31 ft/sec, indicative of household ambulator  Pre-gait activities standing balance ck  Stairs Yes  Stairs assistance Min guard  Stair Management One rail Right;One rail Left;Step to pattern;Forwards  Number of Stairs 10  General stair comments pt is able to sequence as instructed with good transfer from stair rails to walker  Balance  Overall balance assessment Needs assistance  Sitting-balance support Feet supported  Sitting balance-Leahy Scale Good  Postural control Posterior lean  Standing balance support Bilateral upper extremity supported;During functional activity  Standing balance-Leahy Scale Fair  Standing balance comment fair in L knee immobilizer  General Comments  General comments (skin integrity, edema, etc.) pt was seen for control  of balance in standing with walker, cues for direction and then she was able to maneuver with very minor instruction  PT - End of Session  Equipment Utilized During Treatment Gait belt  Activity Tolerance Patient  limited by fatigue;Patient limited by pain  Patient left in chair;with call bell/phone within reach  Nurse Communication Mobility status  CPM Left Knee  CPM Left Knee Off  PT Assessment  PT Recommendation/Assessment Patient needs continued PT services  PT Visit Diagnosis Unsteadiness on feet (R26.81);Muscle weakness (generalized) (M62.81);Pain  Pain - Right/Left Left  Pain - part of body Knee  PT Problem List Decreased strength;Decreased range of motion;Decreased activity tolerance;Decreased balance;Decreased mobility;Decreased skin integrity;Pain  PT Plan  PT Frequency (ACUTE ONLY) 7X/week  PT Treatment/Interventions (ACUTE ONLY) DME instruction;Gait training;Stair training;Functional mobility training;Therapeutic activities;Therapeutic exercise;Balance training;Neuromuscular re-education;Patient/family education  AM-PAC PT "6 Clicks" Mobility Outcome Measure (Version 2)  Help needed turning from your back to your side while in a flat bed without using bedrails? 3  Help needed moving from lying on your back to sitting on the side of a flat bed without using bedrails? 3  Help needed moving to and from a bed to a chair (including a wheelchair)? 3  Help needed standing up from a chair using your arms (e.g., wheelchair or bedside chair)? 3  Help needed to walk in hospital room? 3  Help needed climbing 3-5 steps with a railing?  3  6 Click Score 18  Consider Recommendation of Discharge To: Home with [facility]  Progressive Mobility  What is the highest level of mobility based on the progressive mobility assessment? Level 4 (Walks with assist in room) - Balance while marching in place and cannot step forward and back - Complete  Activity Ambulated with assistance in room  PT Recommendation  Follow Up Recommendations Home health PT  Assistance recommended at discharge Intermittent Supervision/Assistance  Patient can return home with the following A little help with walking and/or transfers;A little  help with bathing/dressing/bathroom;Assistance with cooking/housework;Assist for transportation;Help with stairs or ramp for entrance  Functional Status Assessment Patient has had a recent decline in their functional status and demonstrates the ability to make significant improvements in function in a reasonable and predictable amount of time.  PT equipment None recommended by PT  Individuals Consulted  Consulted and Agree with Results and Recommendations Patient  Acute Rehab PT Goals  Patient Stated Goal to get home with help from friends  PT Goal Formulation With patient  Time For Goal Achievement 10/14/21  Potential to Achieve Goals Good  PT Time Calculation  PT Start Time (ACUTE ONLY) 1135  PT Stop Time (ACUTE ONLY) 1219  PT Time Calculation (min) (ACUTE ONLY) 44 min  PT General Charges  $$ ACUTE PT VISIT 1 Visit  PT Evaluation  $PT Eval Moderate Complexity 1 Mod  PT Treatments  $Gait Training 8-22 mins  $Therapeutic Activity 8-22 mins  Written Expression  Dominant Hand Right   Mee Hives, PT PhD Acute Rehab Dept. Number: [place] [facility] [place]

## 2021-10-01 DIAGNOSIS — M1712 Unilateral primary osteoarthritis, left knee: Secondary | ICD-10-CM | POA: Diagnosis not present

## 2021-10-01 DIAGNOSIS — Z96652 Presence of left artificial knee joint: Secondary | ICD-10-CM | POA: Diagnosis not present

## 2021-10-01 DIAGNOSIS — Z87891 Personal history of nicotine dependence: Secondary | ICD-10-CM | POA: Diagnosis not present

## 2021-10-01 DIAGNOSIS — Z79899 Other long term (current) drug therapy: Secondary | ICD-10-CM | POA: Diagnosis not present

## 2021-10-01 DIAGNOSIS — I10 Essential (primary) hypertension: Secondary | ICD-10-CM | POA: Diagnosis not present

## 2021-10-01 DIAGNOSIS — M25562 Pain in left knee: Secondary | ICD-10-CM | POA: Diagnosis not present

## 2021-10-01 LAB — CBC
HCT: 35.1 % — ABNORMAL LOW (ref 36.0–46.0)
Hemoglobin: 12 g/dL (ref 12.0–15.0)
MCH: 30.2 pg (ref 26.0–34.0)
MCHC: 34.2 g/dL (ref 30.0–36.0)
MCV: 88.2 fL (ref 80.0–100.0)
Platelets: 191 10*3/uL (ref 150–400)
RBC: 3.98 MIL/uL (ref 3.87–5.11)
RDW: 14.1 % (ref 11.5–15.5)
WBC: 8.1 10*3/uL (ref 4.0–10.5)
nRBC: 0 % (ref 0.0–0.2)

## 2021-10-01 MED ORDER — HYDROMORPHONE HCL 2 MG PO TABS: 1.0000 mg | ORAL_TABLET | ORAL | 0 refills | Status: AC | PRN

## 2021-10-01 NOTE — Discharge Summary (Signed)
Physician Discharge Summary      Patient ID: Allison Cox MRN: [id number] DOB/AGE: [birth year] 76 y.o.  Admit date: 09/29/2021 Discharge date: 10/01/2021  Admission Diagnoses:  Principal Problem:   S/P total knee arthroplasty, left   Discharge Diagnoses:  Same  Surgeries: Procedure(s): LEFT TOTAL KNEE ARTHROPLASTY on 09/29/2021   Consultants:   Discharged Condition: Stable  Hospital Course: Allison Cox is an 76 y.o. 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  They were brought to the operating room on 09/29/2021 and underwent the above named procedures.  Patient tolerated the procedure well.  Was seen by physical therapy and she mobilized well on postop day #1 and 2.  She was ambulating in the hall and was independent.  With pain controlled.  She was discharged home in good condition on pain medicine aspirin for DVT prophylaxis and muscle relaxers.  She will follow-up with [place] in 2 weeks  Antibiotics given:  Anti-infectives (From admission, onward)    Start     Dose/Rate Route Frequency Ordered Stop   09/30/21 0600  ceFAZolin (ANCEF) IVPB 2g/100 mL premix        2 g 200 mL/hr over 30 Minutes Intravenous On call to O.R. 09/29/21 1330 09/30/21 0630   09/29/21 2200  ceFAZolin (ANCEF) IVPB 2g/100 mL premix        2 g 200 mL/hr over 30 Minutes Intravenous Every 8 hours 09/29/21 2000 09/30/21 1805   09/29/21 1334  ceFAZolin (ANCEF) 2-4 GM/100ML-% IVPB  Status:  Discontinued       Note to Pharmacy: Mendel Corning B: cabinet override      09/29/21 1334 09/29/21 1338   09/29/21 1333  ceFAZolin (ANCEF) 2-4 GM/100ML-% IVPB       Note to Pharmacy: Mendel Corning B: cabinet override      09/29/21 1333 09/29/21 1534     .  Recent vital signs:  Vitals:   10/01/21 0425 10/01/21 0815  BP: (!) 109/51 (!) 86/51  Pulse: (!) 57 64  Resp: 16 15  Temp: 97.9 F (36.6 C) 97.7 F (36.5 C)  SpO2:  93% 96%    Recent laboratory studies:  Results for orders placed or performed during the hospital encounter of 09/29/21  CBC  Result Value Ref Range   WBC 9.9 4.0 - 10.5 K/uL   RBC 4.00 3.87 - 5.11 MIL/uL   Hemoglobin 11.9 (L) 12.0 - 15.0 g/dL   HCT 34.5 (L) 36.0 - 46.0 %   MCV 86.3 80.0 - 100.0 fL   MCH 29.8 26.0 - 34.0 pg   MCHC 34.5 30.0 - 36.0 g/dL   RDW 13.8 11.5 - 15.5 %   Platelets 197 150 - 400 K/uL   nRBC 0.0 0.0 - 0.2 %  CBC  Result Value Ref Range   WBC 8.1 4.0 - 10.5 K/uL   RBC 3.98 3.87 - 5.11 MIL/uL   Hemoglobin 12.0 12.0 - 15.0 g/dL   HCT 35.1 (L) 36.0 - 46.0 %   MCV 88.2 80.0 - 100.0 fL   MCH 30.2 26.0 - 34.0 pg   MCHC 34.2 30.0 - 36.0 g/dL   RDW 14.1 11.5 - 15.5 %   Platelets 191 150 - 400 K/uL   nRBC 0.0 0.0 - 0.2 %    Discharge Medications:   Allergies as of 10/01/2021       Reactions   Codeine Nausea And Vomiting  Medication List     TAKE these medications    acetaminophen 325 MG tablet Commonly known as: TYLENOL Take 1-2 tablets (325-650 mg total) by mouth every 6 (six) hours as needed for mild pain (pain score 1-3 or temp > 100.5).   aspirin 81 MG chewable tablet Chew 1 tablet (81 mg total) by mouth daily.   atorvastatin 20 MG tablet Commonly known as: LIPITOR Take 1 tablet (20 mg total) by mouth daily.   celecoxib 100 MG capsule Commonly known as: CELEBREX Take 1 capsule (100 mg total) by mouth 2 (two) times daily.   docusate sodium 100 MG capsule Commonly known as: COLACE Take 1 capsule (100 mg total) by mouth 2 (two) times daily.   HYDROmorphone 2 MG tablet Commonly known as: Dilaudid Take 0.5 tablets (1 mg total) by mouth every 4 (four) hours as needed for up to 10 days for severe pain.   LORazepam 0.5 MG tablet Commonly known as: ATIVAN Take 1 tablet (0.5 mg total) by mouth every 8 (eight) hours as needed for anxiety.   methocarbamol 500 MG tablet Commonly known as: ROBAXIN Take 1 tablet (500 mg total) by mouth  every 6 (six) hours as needed for muscle spasms.   sertraline 100 MG tablet Commonly known as: ZOLOFT TAKE 1 & 1/2 (ONE & ONE-HALF) TABLETS BY MOUTH ONCE DAILY   Vitamin D (Ergocalciferol) 1.25 MG (50000 UNIT) Caps capsule Commonly known as: DRISDOL Take 1 capsule (50,000 Units total) by mouth every 7 (seven) days.        Diagnostic Studies: No results found.  Disposition: Discharge disposition: 01-Home or Self Care       Discharge Instructions     Call MD / Call 911   Complete by: As directed    If you experience chest pain or shortness of breath, CALL 911 and be transported to the hospital emergency room.  If you develope a fever above 101 F, pus (white drainage) or increased drainage or redness at the wound, or calf pain, call your surgeon's office.   Call MD / Call 911   Complete by: As directed    If you experience chest pain or shortness of breath, CALL 911 and be transported to the hospital emergency room.  If you develope a fever above 101 F, pus (white drainage) or increased drainage or redness at the wound, or calf pain, call your surgeon's office.   Constipation Prevention   Complete by: As directed    Drink plenty of fluids.  Prune juice may be helpful.  You may use a stool softener, such as Colace (over the counter) 100 mg twice a day.  Use MiraLax (over the counter) for constipation as needed.   Constipation Prevention   Complete by: As directed    Drink plenty of fluids.  Prune juice may be helpful.  You may use a stool softener, such as Colace (over the counter) 100 mg twice a day.  Use MiraLax (over the counter) for constipation as needed.   Diet - low sodium heart healthy   Complete by: As directed    Diet - low sodium heart healthy   Complete by: As directed    Discharge instructions   Complete by: As directed    CPM machine 1 hour 3 times a day increase degrees daily Okay to shower dressing is waterproof Work on straightening the leg and bending the  knee is much as possible Follow-up in clinic 2 weeks   Discharge instructions  Complete by: As directed    You may shower, dressing is waterproof.  Do not remove the dressing, we will remove it at your first post-op appointment.  Do not take a bath or soak the knee in a tub or pool.  You may weightbear as you can tolerate on the operative leg with a walker.  Continue using the CPM machine 3 times per day for one hour each time, increasing the degrees of range of motion daily.  Use the blue cradle boot under your heel to work on getting your leg straight.  Do NOT put a pillow under your knee.  You will follow-up with Dr. Marlou Sa in the clinic in 2 weeks at your given appointment date.    INSTRUCTIONS AFTER JOINT REPLACEMENT   Remove items at home which could result in a fall. This includes throw rugs or furniture in walking pathways ICE to the affected joint every three hours while awake for 30 minutes at a time, for at least the first 3-5 days, and then as needed for pain and swelling.  Continue to use ice for pain and swelling. You may notice swelling that will progress down to the foot and ankle.  This is normal after surgery.  Elevate your leg when you are not up walking on it.   Continue to use the breathing machine you got in the hospital (incentive spirometer) which will help keep your temperature down.  It is common for your temperature to cycle up and down following surgery, especially at night when you are not up moving around and exerting yourself.  The breathing machine keeps your lungs expanded and your temperature down.   DIET:  As you were doing prior to hospitalization, we recommend a well-balanced diet.  DRESSING / WOUND CARE / SHOWERING  Keep the surgical dressing until follow up.  The dressing is water proof, so you can shower without any extra covering.  IF THE DRESSING FALLS OFF or the wound gets wet inside, change the dressing with sterile gauze.  Please use good hand washing  techniques before changing the dressing.  Do not use any lotions or creams on the incision until instructed by your surgeon.    ACTIVITY  Increase activity slowly as tolerated, but follow the weight bearing instructions below.   No driving for 6 weeks or until further direction given by your physician.  You cannot drive while taking narcotics.  No lifting or carrying greater than 10 lbs. until further directed by your surgeon. Avoid periods of inactivity such as sitting longer than an hour when not asleep. This helps prevent blood clots.  You may return to work once you are authorized by your doctor.     WEIGHT BEARING   Weight bearing as tolerated with assist device (walker, cane, etc) as directed, use it as long as suggested by your surgeon or therapist, typically at least 4-6 weeks.   EXERCISES  Results after joint replacement surgery are often greatly improved when you follow the exercise, range of motion and muscle strengthening exercises prescribed by your doctor. Safety measures are also important to protect the joint from further injury. Any time any of these exercises cause you to have increased pain or swelling, decrease what you are doing until you are comfortable again and then slowly increase them. If you have problems or questions, call your caregiver or physical therapist for advice.   Rehabilitation is important following a joint replacement. After just a few days of immobilization, the muscles of  the leg can become weakened and shrink (atrophy).  These exercises are designed to build up the tone and strength of the thigh and leg muscles and to improve motion. Often times heat used for twenty to thirty minutes before working out will loosen up your tissues and help with improving the range of motion but do not use heat for the first two weeks following surgery (sometimes heat can increase post-operative swelling).   These exercises can be done on a training (exercise) mat, on  the floor, on a table or on a bed. Use whatever works the best and is most comfortable for you.    Use music or television while you are exercising so that the exercises are a pleasant break in your day. This will make your life better with the exercises acting as a break in your routine that you can look forward to.   Perform all exercises about fifteen times, three times per day or as directed.  You should exercise both the operative leg and the other leg as well.  Exercises include:   Quad Sets - Tighten up the muscle on the front of the thigh (Quad) and hold for 5-10 seconds.   Straight Leg Raises - With your knee straight (if you were given a brace, keep it on), lift the leg to 60 degrees, hold for 3 seconds, and slowly lower the leg.  Perform this exercise against resistance later as your leg gets stronger.  Leg Slides: Lying on your back, slowly slide your foot toward your buttocks, bending your knee up off the floor (only go as far as is comfortable). Then slowly slide your foot back down until your leg is flat on the floor again.  Angel Wings: Lying on your back spread your legs to the side as far apart as you can without causing discomfort.  Hamstring Strength:  Lying on your back, push your heel against the floor with your leg straight by tightening up the muscles of your buttocks.  Repeat, but this time bend your knee to a comfortable angle, and push your heel against the floor.  You may put a pillow under the heel to make it more comfortable if necessary.   A rehabilitation program following joint replacement surgery can speed recovery and prevent re-injury in the future due to weakened muscles. Contact your doctor or a physical therapist for more information on knee rehabilitation.    CONSTIPATION  Constipation is defined medically as fewer than three stools per week and severe constipation as less than one stool per week.  Even if you have a regular bowel pattern at home, your normal  regimen is likely to be disrupted due to multiple reasons following surgery.  Combination of anesthesia, postoperative narcotics, change in appetite and fluid intake all can affect your bowels.   YOU MUST use at least one of the following options; they are listed in order of increasing strength to get the job done.  They are all available over the counter, and you may need to use some, POSSIBLY even all of these options:    Drink plenty of fluids (prune juice may be helpful) and high fiber foods Colace 100 mg by mouth twice a day  Senokot for constipation as directed and as needed Dulcolax (bisacodyl), take with full glass of water  Miralax (polyethylene glycol) once or twice a day as needed.  If you have tried all these things and are unable to have a bowel movement in the first 3-4 days  after surgery call either your surgeon or your primary doctor.    If you experience loose stools or diarrhea, hold the medications until you stool forms back up.  If your symptoms do not get better within 1 week or if they get worse, check with your doctor.  If you experience "the worst abdominal pain ever" or develop nausea or vomiting, please contact the office immediately for further recommendations for treatment.   ITCHING:  If you experience itching with your medications, try taking only a single pain pill, or even half a pain pill at a time.  You can also use Benadryl over the counter for itching or also to help with sleep.   TED HOSE STOCKINGS:  Use stockings on both legs until for at least 2 weeks or as directed by physician office. They may be removed at night for sleeping.  MEDICATIONS:  See your medication summary on the "After Visit Summary" that nursing will review with you.  You may have some home medications which will be placed on hold until you complete the course of blood thinner medication.  It is important for you to complete the blood thinner medication as prescribed.  PRECAUTIONS:  If you  experience chest pain or shortness of breath - call 911 immediately for transfer to the hospital emergency department.   If you develop a fever greater that 101 F, purulent drainage from wound, increased redness or drainage from wound, foul odor from the wound/dressing, or calf pain - CONTACT YOUR SURGEON.                                                   FOLLOW-UP APPOINTMENTS:  If you do not already have a post-op appointment, please call the office for an appointment to be seen by your surgeon.  Guidelines for how soon to be seen are listed in your "After Visit Summary", but are typically between 1-4 weeks after surgery.  OTHER INSTRUCTIONS:   Knee Replacement:  Do not place pillow under knee, focus on keeping the knee straight while resting. CPM instructions: 0-90 degrees, 2 hours in the morning, 2 hours in the afternoon, and 2 hours in the evening. Place foam block, curve side up under heel at all times except when in CPM or when walking.  DO NOT modify, tear, cut, or change the foam block in any way.  POST-OPERATIVE OPIOID TAPER INSTRUCTIONS: It is important to wean off of your opioid medication as soon as possible. If you do not need pain medication after your surgery it is ok to stop day one. Opioids include: Codeine, Hydrocodone(Norco, Vicodin), Oxycodone(Percocet, oxycontin) and hydromorphone amongst others.  Long term and even short term use of opiods can cause: Increased pain response Dependence Constipation Depression Respiratory depression And more.  Withdrawal symptoms can include Flu like symptoms Nausea, vomiting And more Techniques to manage these symptoms Hydrate well Eat regular healthy meals Stay active Use relaxation techniques(deep breathing, meditating, yoga) Do Not substitute Alcohol to help with tapering If you have been on opioids for less than two weeks and do not have pain than it is ok to stop all together.  Plan to wean off of opioids This plan should  start within one week post op of your joint replacement. Maintain the same interval or time between taking each dose and first decrease the dose.  Cut the total daily intake of opioids by one tablet each day Next start to increase the time between doses. The last dose that should be eliminated is the evening dose.   MAKE SURE YOU:  Understand these instructions.  Get help right away if you are not doing well or get worse.    Thank you for letting us be a part of your medical care team.  It is a privilege we respect greatly.  We hope these instructions will help you stay on track for a fast and full recovery!    Dental Antibiotics:  In most cases prophylactic antibiotics for Dental procdeures after total joint surgery are not necessary.  Exceptions are as follows:  1. History of prior total joint infection  2. Severely immunocompromised (Organ Transplant, cancer chemotherapy, Rheumatoid biologic meds such as Brigantine)  3. Poorly controlled diabetes (A1C &gt; 8.0, blood glucose over 200)  If you have one of these conditions, contact your surgeon for an antibiotic prescription, prior to your dental procedure.   Face-to-face encounter (required for Medicare/Medicaid patients)   Complete by: As directed    I Allison Cox certify that this patient is under my care and that I, or a nurse practitioner or physician's assistant working with me, had a face-to-face encounter that meets the physician face-to-face encounter requirements with this patient on 09/30/2021. The encounter with the patient was in whole, or in part for the following medical condition(s) which is the primary reason for home health care (List medical condition): Left total knee replacement   The encounter with the patient was in whole, or in part, for the following medical condition, which is the primary reason for home health care: Total knee replacement   I certify that, based on my findings, the following services are  medically necessary home health services: Physical therapy   Reason for Medically Necessary Home Health Services: Therapy- Therapeutic Exercises to Increase Strength and Endurance   My clinical findings support the need for the above services: Pain interferes with ambulation/mobility   Further, I certify that my clinical findings support that this patient is homebound due to: Pain interferes with ambulation/mobility   Home Health   Complete by: As directed    To provide the following care/treatments: PT   Increase activity slowly as tolerated   Complete by: As directed    Increase activity slowly as tolerated   Complete by: As directed    Post-operative opioid taper instructions:   Complete by: As directed    POST-OPERATIVE OPIOID TAPER INSTRUCTIONS: It is important to wean off of your opioid medication as soon as possible. If you do not need pain medication after your surgery it is ok to stop day one. Opioids include: Codeine, Hydrocodone(Norco, Vicodin), Oxycodone(Percocet, oxycontin) and hydromorphone amongst others.  Long term and even short term use of opiods can cause: Increased pain response Dependence Constipation Depression Respiratory depression And more.  Withdrawal symptoms can include Flu like symptoms Nausea, vomiting And more Techniques to manage these symptoms Hydrate well Eat regular healthy meals Stay active Use relaxation techniques(deep breathing, meditating, yoga) Do Not substitute Alcohol to help with tapering If you have been on opioids for less than two weeks and do not have pain than it is ok to stop all together.  Plan to wean off of opioids This plan should start within one week post op of your joint replacement. Maintain the same interval or time between taking each dose and first decrease the dose.  Cut  the total daily intake of opioids by one tablet each day Next start to increase the time between doses. The last dose that should be eliminated is  the evening dose.      Post-operative opioid taper instructions:   Complete by: As directed    POST-OPERATIVE OPIOID TAPER INSTRUCTIONS: It is important to wean off of your opioid medication as soon as possible. If you do not need pain medication after your surgery it is ok to stop day one. Opioids include: Codeine, Hydrocodone(Norco, Vicodin), Oxycodone(Percocet, oxycontin) and hydromorphone amongst others.  Long term and even short term use of opiods can cause: Increased pain response Dependence Constipation Depression Respiratory depression And more.  Withdrawal symptoms can include Flu like symptoms Nausea, vomiting And more Techniques to manage these symptoms Hydrate well Eat regular healthy meals Stay active Use relaxation techniques(deep breathing, meditating, yoga) Do Not substitute Alcohol to help with tapering If you have been on opioids for less than two weeks and do not have pain than it is ok to stop all together.  Plan to wean off of opioids This plan should start within one week post op of your joint replacement. Maintain the same interval or time between taking each dose and first decrease the dose.  Cut the total daily intake of opioids by one tablet each day Next start to increase the time between doses. The last dose that should be eliminated is the evening dose.           Follow-up Information     [facility] [place] Follow up.   Specialty: [place] Why: home health PT services will be provided by [facility] information: [address] [place] [address] [phone]                  Signed: Anderson Cox 10/01/2021, 7:49 PM

## 2021-10-01 NOTE — Care Management Obs Status (Signed)
[place] NOTIFICATION   Patient Details  Name: Allison Cox MRN: [id number] Date of Birth: [birth year]   Medicare Observation Status Notification Given:  Yes    Bartholomew Crews, RN 10/01/2021, 8:09 AM

## 2021-10-01 NOTE — TOC Transition Note (Signed)
Transition of Care [facility]) - CM/SW Discharge Note   Patient Details  Name: Allison Cox MRN: [id number] Date of Birth: 1946/04/06  [facility] CM/SW Contact:  Bartholomew Crews, RN Phone Number: [phone] 10/01/2021, 8:10 AM   Clinical Narrative:     Spoke with patient on her mobile phone to discuss post acute transition. Patient reported that pain management is more difficult this morning. PT to work with patient prior to discharge. HH PT arranged by outpatient provider with [facility] - liaison notified of anticipated transition home today. Patient has RW at home, and has arranged for someone to stay with her during recovery. No further TOC needs identified at this time.   Final next level of care: [place] Barriers to Discharge: No Barriers Identified   Patient Goals and CMS Choice Patient states their goals for this hospitalization and ongoing recovery are:: return home CMS Medicare.gov Compare Post Acute Care list provided to:: Patient Choice offered to / list presented to : Patient  Discharge Placement                       Discharge Plan and Services                DME Arranged: N/A DME Agency: NA       HH Arranged: PT HH Agency: [place] Date [place]: 10/01/21 Time [place]: [phone] Representative spoke with at [place]: [place] Determinants of Health ([place]) Interventions     Readmission Risk Interventions     View : No data to display.

## 2021-10-01 NOTE — Progress Notes (Signed)
  Subjective: Allison Cox is a 76 y.o. female s/p left TKA.  They are POD 2.  Pt's pain is controlled.  Pt denies numbness/tingling/weakness.  Pt has ambulated with some difficulty.  Performed well with physical therapy yesterday.  She had no dizziness or lightheadedness with ambulation.  No complaint of chest pain, shortness of breath, abdominal pain.  She has a healthy appetite and and is eating well.  Objective: Vital signs in last 24 hours: Temp:  [97.8 F (36.6 C)-98.2 F (36.8 C)] 97.9 F (36.6 C) (05/20 0425) Pulse Rate:  [52-62] 57 (05/20 0425) Resp:  [16-18] 16 (05/20 0425) BP: (100-122)/(47-52) 109/51 (05/20 0425) SpO2:  [93 %-98 %] 93 % (05/20 0425)  Intake/Output from previous day: 05/19 0701 - 05/20 0700 In: 2.2 [I.V.:2.2] Out: -  Intake/Output this shift: No intake/output data recorded.  Exam:  No gross blood or drainage overlying the dressing 2+ DP pulse Sensation intact distally in the left foot Able to dorsiflex and plantarflex the left foot Able to perform straight leg raise without extensor lag.  No calf tenderness.  Negative Homans' sign.   Labs: Recent Labs    09/30/21 0227 10/01/21 0231  HGB 11.9* 12.0   Recent Labs    09/30/21 0227 10/01/21 0231  WBC 9.9 8.1  RBC 4.00 3.98  HCT 34.5* 35.1*  PLT 197 191   No results for input(s): NA, K, CL, CO2, BUN, CREATININE, GLUCOSE, CALCIUM in the last 72 hours. No results for input(s): LABPT, INR in the last 72 hours.  Assessment/Plan: Pt is POD 2 s/p left TKA.    -Plan to discharge to home today pending PT eval.  She performed well with physical therapy yesterday but just want to make sure she does not have any setbacks today.  She was able to ambulate to the bathroom several times throughout the night without much difficulty.  Able to perform straight leg raise on exam today.  Follow-up with Dr. Marlou Sa in 2 weeks.  -WBAT with a walker   Lynasia Meloche L Shayonna Ocampo 10/01/2021, 7:56 AM

## 2021-10-01 NOTE — Progress Notes (Signed)
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    PT Comments    Pt able to perform bed mobility modI with HOB elevated and using bed rails. Pt requiring min guard for transfers and ambulation. Pt requested to sit back down after ambulating to the door d/t nausea and fatigue. Pt and caregiver feel comfortable returning home with pts current level of function and required assistance level. Pt has no further questions at this time. Pt would benefit from HHPT to continue to address strength, ROM, and functional mobility.    Recommendations for follow up therapy are one component of a multi-disciplinary discharge planning process, led by the attending physician.  Recommendations may be updated based on patient status, additional functional criteria and insurance authorization.  Follow Up Recommendations  Follow physician's recommendations for discharge plan and follow up therapies     Assistance Recommended at Discharge Intermittent Supervision/Assistance  Patient can return home with the following A little help with walking and/or transfers;A little help with bathing/dressing/bathroom;Assistance with cooking/housework;Assist for transportation;Help with stairs or ramp for entrance   Equipment Recommendations  None recommended by PT    Recommendations for Other Services       Precautions / Restrictions Precautions Precautions: Knee Required Braces or Orthoses: Knee Immobilizer - Left Knee Immobilizer - Left:  (as needed) Restrictions Weight Bearing Restrictions: Yes LLE Weight Bearing: Weight bearing as tolerated     Mobility  Bed Mobility Overal bed mobility: Modified Independent             General bed mobility comments: HOB elevated    Transfers Overall transfer level: Needs assistance Equipment  used: Rolling walker (2 wheels)   Sit to Stand: Min guard           General transfer comment: Pt required multiple attempts to stand from bed . Able to perform toilet transfers min guard with commode chair    Ambulation/Gait Ambulation/Gait assistance: Min guard Gait Distance (Feet): 30 Feet Assistive device: Rolling walker (2 wheels) Gait Pattern/deviations: Step-to pattern, Decreased stance time - left, Decreased step length - right Gait velocity: reduced     General Gait Details: Verbal cues for pointing L toes forward, L heel strike, and step through pattern   Stairs             Wheelchair Mobility    Modified Rankin (Stroke Patients Only)       Balance Overall balance assessment: Modified Independent                                          Cognition Arousal/Alertness: Awake/alert Behavior During Therapy: WFL for tasks assessed/performed Overall Cognitive Status: Within Functional Limits for tasks assessed                                          Exercises Total Joint Exercises Goniometric ROM: 12-64    General Comments        Pertinent Vitals/Pain Pain Assessment Pain Assessment: Faces Faces Pain Scale: Hurts a little bit Pain Location: L knee Pain Descriptors / Indicators: Discomfort, Grimacing Pain Intervention(s): Ice applied, Premedicated before session  Home Living                          Prior Function            PT Goals (current goals can now be found in the care plan section) Acute Rehab PT Goals Patient Stated Goal: to get home with help from friends PT Goal Formulation: With patient Time For Goal Achievement: 10/14/21 Potential to Achieve Goals: Good Progress towards PT goals: Progressing toward goals    Frequency    7X/week      PT Plan Current plan remains appropriate    Co-evaluation              AM-PAC PT "6 Clicks" Mobility   Outcome Measure  Help  needed turning from your back to your side while in a flat bed without using bedrails?: None Help needed moving from lying on your back to sitting on the side of a flat bed without using bedrails?: A Little Help needed moving to and from a bed to a chair (including a wheelchair)?: A Little Help needed standing up from a chair using your arms (e.g., wheelchair or bedside chair)?: A Little Help needed to walk in hospital room?: A Little Help needed climbing 3-5 steps with a railing? : A Little 6 Click Score: 19    End of Session   Activity Tolerance: Patient limited by fatigue (nausea) Patient left: in bed;with call bell/phone within reach;with family/visitor present Nurse Communication: Mobility status PT Visit Diagnosis: Unsteadiness on feet (R26.81);Muscle weakness (generalized) (M62.81);Pain Pain - Right/Left: Left Pain - part of body: Knee     Time: 1607-3710 PT Time Calculation (min) (ACUTE ONLY): 18 min  Charges:  $Gait Training: 8-22 mins                     Mackie Pai, SPT Acute Rehabilitation Services     Mackie Pai 10/01/2021, 10:55 AM

## 2021-10-02 ENCOUNTER — Encounter (HOSPITAL_COMMUNITY): Payer: Self-pay | Admitting: Orthopedic Surgery

## 2021-10-02 DIAGNOSIS — Z791 Long term (current) use of non-steroidal anti-inflammatories (NSAID): Secondary | ICD-10-CM | POA: Diagnosis not present

## 2021-10-02 DIAGNOSIS — Z87891 Personal history of nicotine dependence: Secondary | ICD-10-CM | POA: Diagnosis not present

## 2021-10-02 DIAGNOSIS — L723 Sebaceous cyst: Secondary | ICD-10-CM | POA: Diagnosis not present

## 2021-10-02 DIAGNOSIS — M545 Low back pain, unspecified: Secondary | ICD-10-CM | POA: Diagnosis not present

## 2021-10-02 DIAGNOSIS — Z471 Aftercare following joint replacement surgery: Secondary | ICD-10-CM | POA: Diagnosis not present

## 2021-10-02 DIAGNOSIS — E785 Hyperlipidemia, unspecified: Secondary | ICD-10-CM | POA: Diagnosis not present

## 2021-10-02 DIAGNOSIS — Z86010 Personal history of colonic polyps: Secondary | ICD-10-CM | POA: Diagnosis not present

## 2021-10-02 DIAGNOSIS — I10 Essential (primary) hypertension: Secondary | ICD-10-CM | POA: Diagnosis not present

## 2021-10-02 DIAGNOSIS — M8580 Other specified disorders of bone density and structure, unspecified site: Secondary | ICD-10-CM | POA: Diagnosis not present

## 2021-10-02 DIAGNOSIS — F411 Generalized anxiety disorder: Secondary | ICD-10-CM | POA: Diagnosis not present

## 2021-10-02 DIAGNOSIS — G8929 Other chronic pain: Secondary | ICD-10-CM | POA: Diagnosis not present

## 2021-10-02 DIAGNOSIS — Z6837 Body mass index (BMI) 37.0-37.9, adult: Secondary | ICD-10-CM | POA: Diagnosis not present

## 2021-10-02 DIAGNOSIS — J302 Other seasonal allergic rhinitis: Secondary | ICD-10-CM | POA: Diagnosis not present

## 2021-10-02 DIAGNOSIS — E669 Obesity, unspecified: Secondary | ICD-10-CM | POA: Diagnosis not present

## 2021-10-02 DIAGNOSIS — Z96652 Presence of left artificial knee joint: Secondary | ICD-10-CM | POA: Diagnosis not present

## 2021-10-02 DIAGNOSIS — E2839 Other primary ovarian failure: Secondary | ICD-10-CM | POA: Diagnosis not present

## 2021-10-03 IMAGING — MR MR KNEE*L* W/O CM
4 of 6 series · 23 of 40 positions shown · non-contrast
Comparison: None.

CLINICAL DATA: Chronic knee pain. Pain for 4-6 months

EXAM:
MRI OF THE LEFT KNEE WITHOUT CONTRAST
TECHNIQUE: Multiplanar, multisequence MR imaging of the knee was performed. No
intravenous contrast was administered.

[Series 4: T2 fat-sat · coronal · 4.0mm · 0.59mm/px · 6 of 23 slices shown (1 of 2)]
[im 1/23]
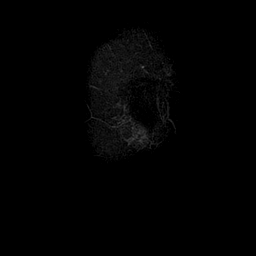
[im 5/23]
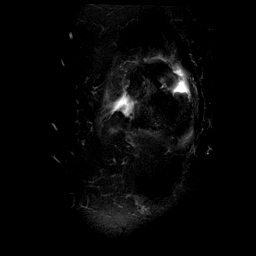
[im 9/23]
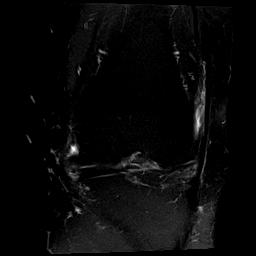
[im 14/23]
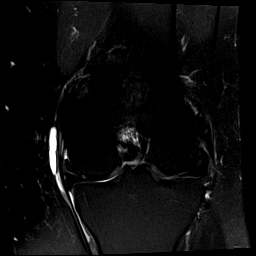
[im 18/23]
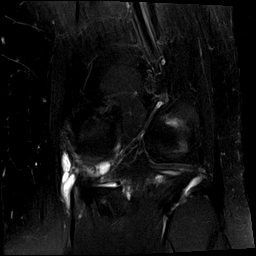
[im 23/23]
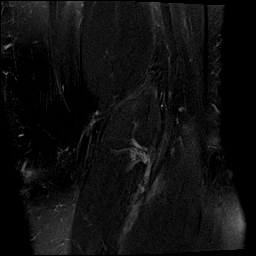

[Series 5: T1 · coronal · 4.0mm · 0.29mm/px · 3 of 23 slices shown]
[im 5/23]
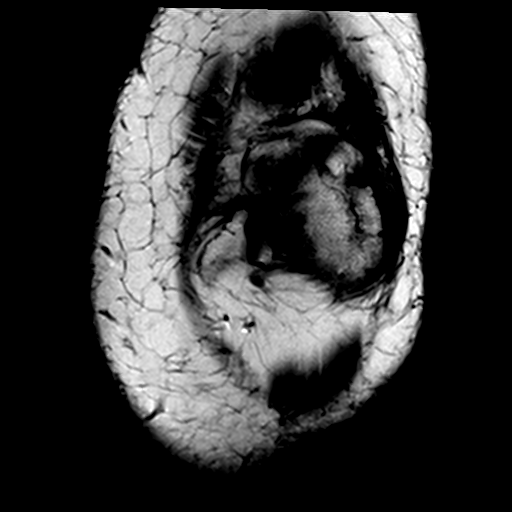
[im 14/23]
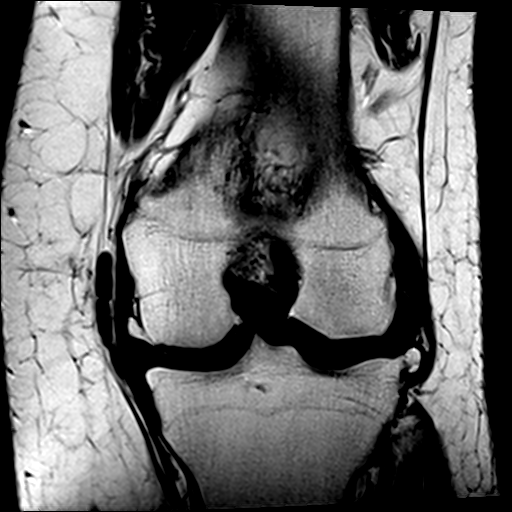
[im 23/23]
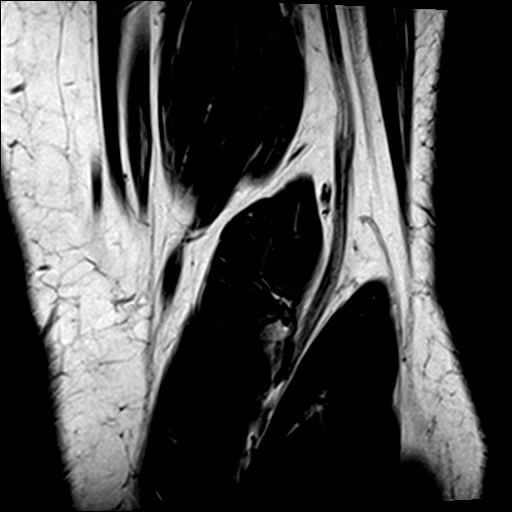

[Series 7: PD fat-sat · sagittal · 3.0mm · 0.29mm/px · 8 of 30 slices shown]
[im 1/30]
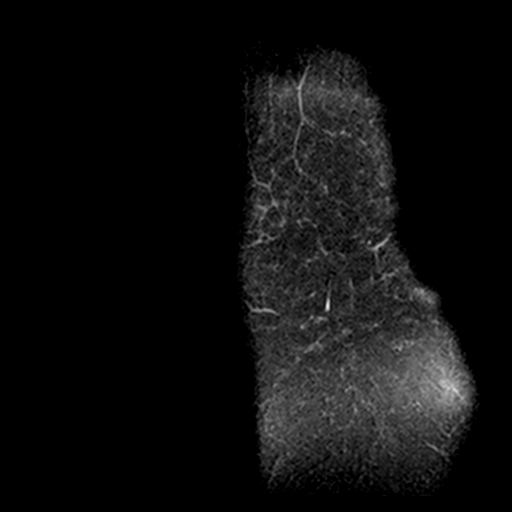
[im 5/30]
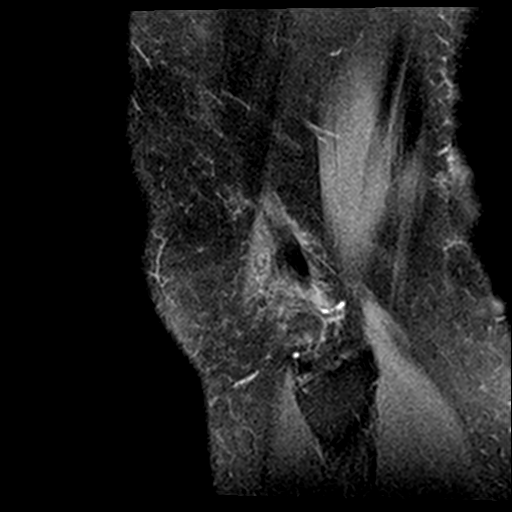
[im 9/30]
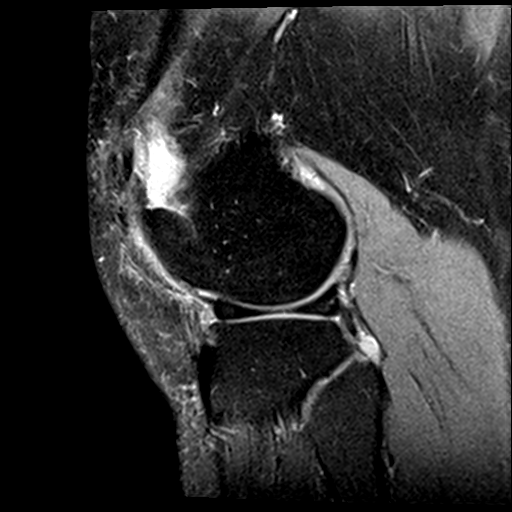
[im 13/30]
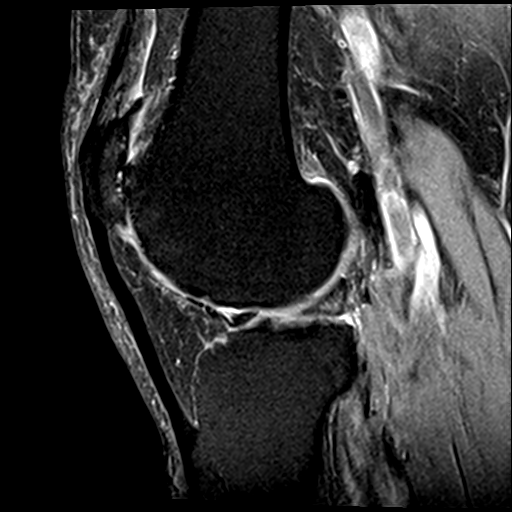
[im 17/30]
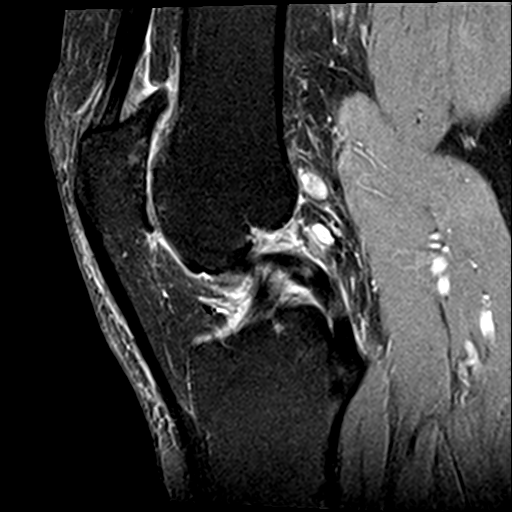
[im 21/30]
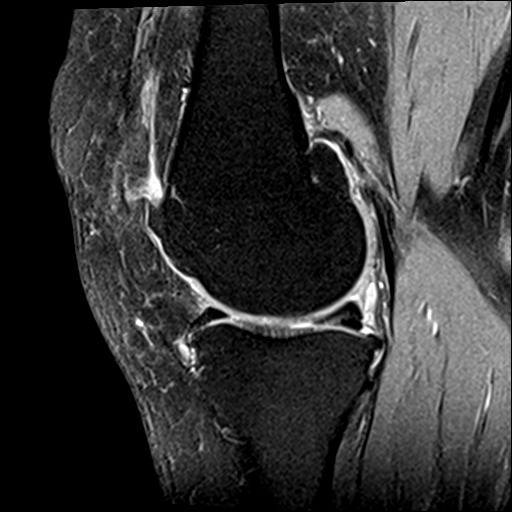
[im 25/30]
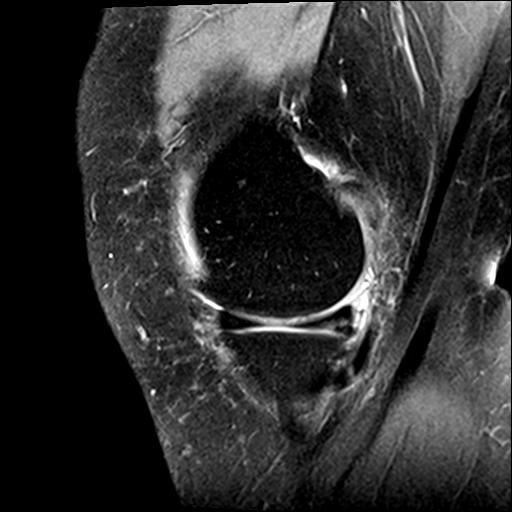
[im 30/30]
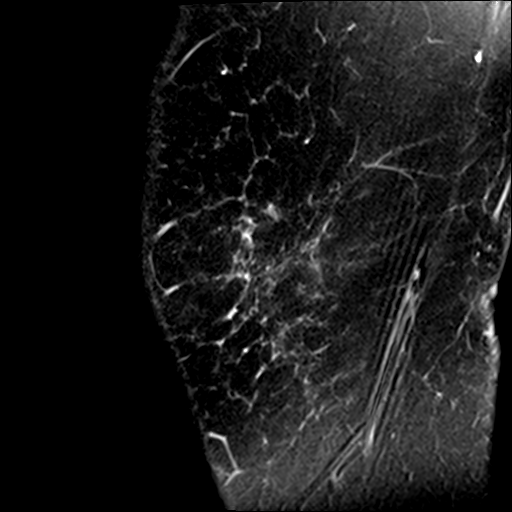

[Series 8: T2 fat-sat · sagittal · 3.0mm · 0.29mm/px · 6 of 30 slices shown (2 of 2)]
[im 1/30]
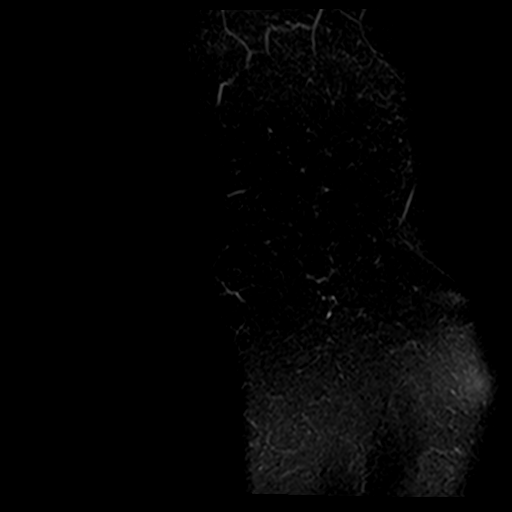
[im 5/30]
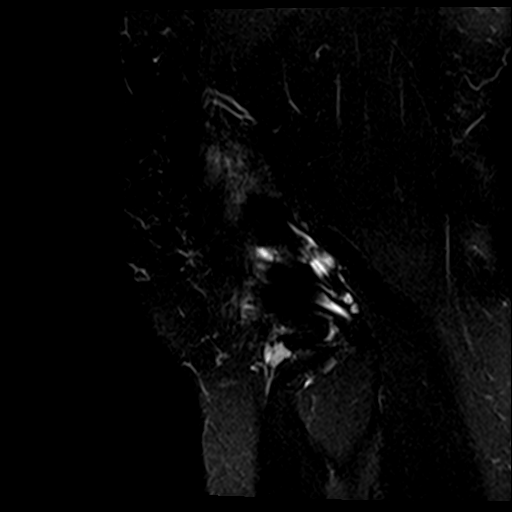
[im 9/30]
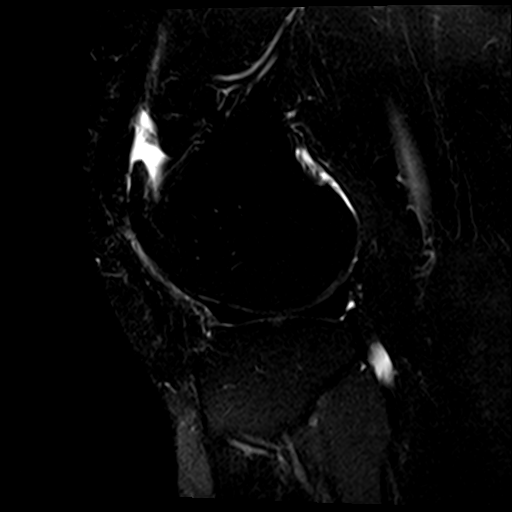
[im 13/30]
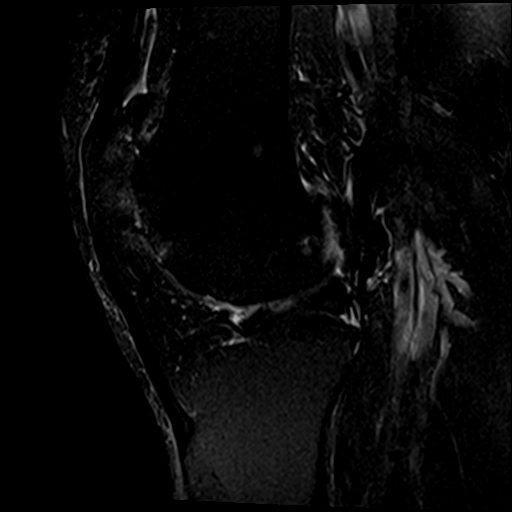
[im 17/30]
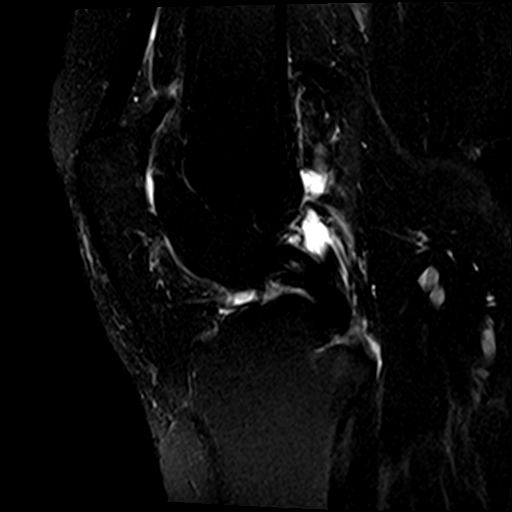
[im 25/30]
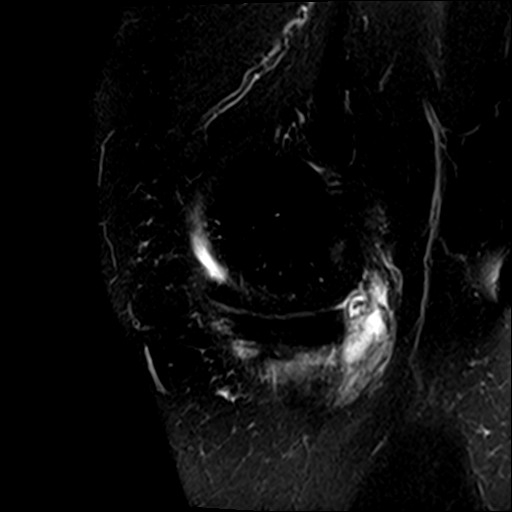

[23 of 40 positions shown; findings below may reference images not displayed]

FINDINGS: MENISCI

Medial: Oblique tear of the posterior horn of the medial meniscus
extending to the inferior articular surface. Degeneration of the
medial meniscus. Peripheral meniscal extrusion.

Lateral: Degeneration of the root of the posterior horn of the
lateral meniscus. No discrete tear.

LIGAMENTS

Cruciates: ACL and PCL are intact.

Collaterals: Medial collateral ligament is intact. Lateral
collateral ligament complex is intact.

CARTILAGE

Patellofemoral: Extensive full-thickness cartilage loss of the
patellofemoral compartment subchondral reactive marrow edema.

Medial: Partial-thickness cartilage loss of the medial femorotibial
compartment.

Lateral: Mild partial-thickness cartilage loss of the lateral
femorotibial compartment.

JOINT: No joint effusion. Normal Myrtha Jong. No plical
thickening.

POPLITEAL FOSSA: Popliteus tendon is intact. No Baker's cyst.

EXTENSOR MECHANISM: Intact quadriceps tendon. Intact patellar
tendon. Intact lateral patellar retinaculum. Intact medial patellar
retinaculum. Intact MPFL.

BONES: No aggressive osseous lesion. No fracture or dislocation.

Other: No fluid collection or hematoma. Muscles are normal. Small
amount of fluid in the pes

Bursa.
IMPRESSION: 1. Oblique tear of the posterior horn of the medial meniscus
extending to the inferior articular surface. Degeneration of the
medial meniscus. Peripheral meniscal extrusion.
2. Degeneration of the root of the posterior horn of the lateral
meniscus.
3. Tricompartmental cartilage abnormalities as described above most
severe in the patellofemoral compartment.

## 2021-10-04 NOTE — Op Note (Unsigned)
Allison Cox, Allison Cox MEDICAL RECORD NO: [id number] ACCOUNT NO: [id number] DATE OF BIRTH: Oct 12, 1945 FACILITY: [facility] LOCATION: [id number] PHYSICIAN: Yetta Barre. Marlou Sa, MD  Operative Report   DATE OF PROCEDURE: 09/29/2021  PREOPERATIVE DIAGNOSIS:  Left knee arthritis.  POSTOPERATIVE DIAGNOSIS:  Left knee arthritis.  PROCEDURE:  Left total knee replacement utilizing press-fit components, [employer] knee 8 narrow femur, size D tibia, 32 mm press-fit patella, 10 mm medial congruent poly.  SURGEON:  Meredith Pel, MD  ASSISTANT:  Annie Main, PA.  INDICATIONS:  The patient is a 76 year old patient with end-stage left knee arthritis, who presents for operative management after explanation of risks and benefits.  She has failed conservative measures.  DESCRIPTION OF PROCEDURE:  The patient was brought to the operating room where spinal anesthetic was induced.  Preoperative antibiotics administered.  Timeout was called.  Left leg was pre-scrubbed with alcohol, Betadine, allowed to air dry, prepped with  DuraPrep solution and draped in a sterile manner.  Ioban used to cover the operative field.  The leg was elevated and exsanguinated with the Esmarch wrap.  Tourniquet was inflated.  Anterior approach to the knee was made.  Skin and subcutaneous tissue  sharply divided.  Irrisept solution utilized. Median parapatellar arthrotomy was made and marked with #1 Vicryl suture. Irrisept solution also utilized inside the knee.  Severe tricompartmental arthritis was present, worse in the patellofemoral  compartment, but also present in both the medial and lateral compartments.  Fat pad was partially excised.  Lateral patellofemoral ligament was released.  Minimal medial and lateral soft tissue dissection was performed, but it was enough in order to  facilitate retractor placement.  ACL released.  Anterior horn lateral meniscus released.  A cut of 10 mm was made off the least affected medial tibial plateau.   A 10 mm cut made off the distal femur, cut in 5 degrees of valgus.  This allowed for a 10 mm  spacer to be placed with full extension and good stability to varus and valgus stress at 0 degrees.  Next, the femur was sized to a size 8, cut in 3 degrees of external rotation. Anterior, posterior and chamfer cuts.  The trial tibia was placed in the  appropriate rotation.  Femoral trial was placed.  Bone quality was excellent.  Patella then cut down from 22 to 12 mm.  Severe wear present on the lateral patellar facet.  With trial components in position, the patient had full extension, full flexion,  and no liftoff.  Very good stability to varus and valgus stress at 0, 30 and 90 degrees with patellar tracking using no thumbs technique.  Trial components were removed.  Tibia was keel punched prior to removing the tibial tray.  Thorough irrigation was  performed.  Capsule anesthetized using a combination of Marcaine, Exparel and saline.  Tranexamic acid sponge was allowed to sit in irrigated knee joint for 3 minutes.  The knee was irrigated with 3 liters of irrigating solution.  Next, the true  components were placed with very good press fit obtained.  Same stability parameters were maintained.  Tourniquet was released.  Bleeding points encountered were controlled using electrocautery.  Thorough irrigation was performed with pouring irrigation.   The arthrotomy was then closed over a bolster using #1 Vicryl suture.  Prior to final closure, knee joint was filled with Irrisept solution, which was removed and then filled with vancomycin powder.  Arthrotomy closed at that time, Irrisept solution  then used on top of  the arthrotomy.  The knee joint was then injected with Marcaine, morphine, clonidine.  Skin closed using 0 Vicryl suture, 2-0 Vicryl suture, and 3-0 Monocryl with Steri-Strips applied.  Impervious dressing, knee immobilizer and  Iceman/ice pack applied.  The patient tolerated the procedure well without  immediate complication.  Luke's assistance was required at all times for opening, closing, drilling, sawing, limb positioning, implant positioning.  His assistance was a medical  necessity for the entire case.     PAA D: 10/04/2021 10:34:13 am T: 10/04/2021 12:08:00 pm  JOB: [id number]/ [id number]

## 2021-10-07 DIAGNOSIS — M1712 Unilateral primary osteoarthritis, left knee: Secondary | ICD-10-CM

## 2021-10-11 ENCOUNTER — Telehealth: Payer: Self-pay | Admitting: Orthopedic Surgery

## 2021-10-11 ENCOUNTER — Other Ambulatory Visit: Payer: Self-pay | Admitting: Family Medicine

## 2021-10-11 DIAGNOSIS — F411 Generalized anxiety disorder: Secondary | ICD-10-CM

## 2021-10-11 DIAGNOSIS — Z0000 Encounter for general adult medical examination without abnormal findings: Secondary | ICD-10-CM

## 2021-10-11 NOTE — Telephone Encounter (Signed)
Pt called requesting a call about a out pt referral for pt. Please call pt at [phone].

## 2021-10-11 NOTE — Telephone Encounter (Signed)
Requesting: lorazepam 0.'5mg'$   Contract:11/10/19 UDS:11/10/19 Last Visit: 09/22/21 Next Visit: None Last Refill: 04/18/21 #90 and 1RF  Please Advise

## 2021-10-12 NOTE — Telephone Encounter (Signed)
totally

## 2021-10-13 ENCOUNTER — Ambulatory Visit (INDEPENDENT_AMBULATORY_CARE_PROVIDER_SITE_OTHER): Payer: Medicare Other

## 2021-10-13 ENCOUNTER — Encounter: Payer: Self-pay | Admitting: Surgical

## 2021-10-13 ENCOUNTER — Ambulatory Visit (INDEPENDENT_AMBULATORY_CARE_PROVIDER_SITE_OTHER): Payer: Medicare Other | Admitting: Surgical

## 2021-10-13 DIAGNOSIS — Z96652 Presence of left artificial knee joint: Secondary | ICD-10-CM

## 2021-10-13 NOTE — Telephone Encounter (Signed)
IC pt advised she had what sh needed and does not need a referral at this time.

## 2021-10-13 NOTE — Progress Notes (Signed)
Post-Op Visit Note   Patient: Allison Cox           Date of Birth: 02-16-1946           MRN: [id number] Visit Date: 10/13/2021 PCP: [name], DO   Assessment & Plan:  Chief Complaint:  Chief Complaint  Patient presents with   Left Knee - Routine Post Op   Visit Diagnoses:  1. S/P total knee arthroplasty, left     Plan: Liah Morr is a 76 y.o. female who presents s/p left total knee arthroplasty on 09/29/2021.  Doing well overall.  Using CPM machine up to 98  degrees.  They deny any calf pain, shortness of breath, chest pain, abdominal pain.  Pain is overall controlled and taking just Celebrex and Tylenol for pain control.  Taking aspirin for DVT prophylaxis.  Ambulating with walker.  No fevers or chills.  Pain is little worse at night.  She is completely off the opioid pain medication.  On exam, patient has range of motion 8 degrees extension to 95 degrees of knee flexion.  Incision is healing well without evidence of infection or dehiscence.  2+ DP pulse of the operative extremity.  No calf tenderness, negative Homans' sign.  Able to perform straight leg raise.  Intact ankle dorsiflexion.  Plan is continue with aspirin until she is back to her baseline level of mobility.  Start outpatient physical therapy; she has upcoming outpatient physical therapy evaluation tomorrow morning.  Radiographs of the left knee show left total knee arthroplasty in good position and alignment without any complicating features.  Continue with therapy to focus on gait training and quadricep strengthening and passive/active range of motion.  Follow-up with the office for clinical recheck with Dr. Marlou Sa in 4 weeks..    Follow-Up Instructions: No follow-ups on file.   Orders:  Orders Placed This Encounter  Procedures   XR Knee 1-2 Views Left   No orders of the defined types were placed in this encounter.   Imaging: No results found.  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   LOW BACK PAIN, CHRONIC 08/11/2009   COLONIC POLYPS, HYPERPLASTIC, HX OF 08/17/2008   Acute upper respiratory infection 04/17/2008   SINUSITIS- ACUTE-NOS 03/30/2008   Hyperlipidemia 09/26/2007   Anxiety state 01/01/2007   CYST, SEBACEOUS 01/01/2007   OSTEOPENIA 01/01/2007   ARTHROSCOPY, RIGHT KNEE, HX OF 01/01/2007   Past Medical History:  Diagnosis Date   Anxiety    Arthritis    knee   Colon polyps    hyperplastic and Adenomatous   Hyperlipidemia    Osteopenia     Family History  Problem Relation Age of Onset   Coronary artery disease Other    Stroke Mother    Heart attack Father    Colon cancer Neg Hx    Rectal cancer Neg Hx    Stomach cancer Neg Hx     Past Surgical History:  Procedure Laterality Date   CARPAL TUNNEL  RELEASE Left    COLONOSCOPY  07/2012   Henrene Pastor - polyps   KNEE ARTHROSCOPY Right    TOTAL KNEE ARTHROPLASTY Left 09/29/2021   Procedure: LEFT TOTAL KNEE ARTHROPLASTY;  Surgeon: Meredith Pel, MD;  Location: [place];  Service: Orthopedics;  Laterality: Left;   WISDOM TOOTH EXTRACTION     Social History   Occupational History   Not on file  Tobacco Use   Smoking status: Former    Types: Cigarettes    Quit date: 05/15/1986    Years since quitting: 35.4   Smokeless tobacco: Never  Vaping Use   Vaping Use: Never used  Substance and Sexual Activity   Alcohol use: Yes    Alcohol/week: 3.0 - 4.0 standard drinks    Types: 3 - 4 Glasses of wine per  week   Drug use: No   Sexual activity: Yes    Partners: Male    Birth control/protection: Post-menopausal

## 2021-10-14 ENCOUNTER — Telehealth: Payer: Self-pay | Admitting: Orthopedic Surgery

## 2021-10-14 ENCOUNTER — Other Ambulatory Visit: Payer: Self-pay

## 2021-10-14 DIAGNOSIS — Z96652 Presence of left artificial knee joint: Secondary | ICD-10-CM

## 2021-10-14 NOTE — Telephone Encounter (Signed)
Patient called, she would like a referral for out patient PT.

## 2021-10-19 ENCOUNTER — Encounter: Payer: Self-pay | Admitting: Physical Therapy

## 2021-10-19 ENCOUNTER — Ambulatory Visit: Payer: Medicare Other | Admitting: Physical Therapy

## 2021-10-19 ENCOUNTER — Other Ambulatory Visit: Payer: Self-pay

## 2021-10-19 DIAGNOSIS — M6281 Muscle weakness (generalized): Secondary | ICD-10-CM

## 2021-10-19 DIAGNOSIS — R2689 Other abnormalities of gait and mobility: Secondary | ICD-10-CM

## 2021-10-19 DIAGNOSIS — R600 Localized edema: Secondary | ICD-10-CM | POA: Diagnosis not present

## 2021-10-19 DIAGNOSIS — M25662 Stiffness of left knee, not elsewhere classified: Secondary | ICD-10-CM

## 2021-10-19 DIAGNOSIS — M25562 Pain in left knee: Secondary | ICD-10-CM | POA: Diagnosis not present

## 2021-10-19 NOTE — Therapy (Signed)
OUTPATIENT PHYSICAL THERAPY LOWER EXTREMITY EVALUATION   Patient Name: Allison Cox MRN: [id number] DOB:[birth year], 76 y.o., female Today's Date: 10/19/2021   PT End of Session - 10/19/21 0924     Visit Number 1    Number of Visits 15    Date for PT Re-Evaluation 12/14/21    Authorization Type BCBS MCR    Progress Note Due on Visit 10    PT Start Time 0845    PT Stop Time 0927    PT Time Calculation (min) 42 min    Behavior During Therapy [facility] for tasks assessed/performed             Past Medical History:  Diagnosis Date   Anxiety    Arthritis    knee   Colon polyps    hyperplastic and Adenomatous   Hyperlipidemia    Osteopenia    Past Surgical History:  Procedure Laterality Date   CARPAL TUNNEL RELEASE Left    COLONOSCOPY  07/2012   Henrene Pastor - polyps   KNEE ARTHROSCOPY Right    TOTAL KNEE ARTHROPLASTY Left 09/29/2021   Procedure: LEFT TOTAL KNEE ARTHROPLASTY;  Surgeon: Meredith Pel, MD;  Location: [place];  Service: Orthopedics;  Laterality: Left;   WISDOM TOOTH EXTRACTION     Patient Active Problem List   Diagnosis Date Noted   Arthritis of left knee    S/P total knee arthroplasty, left 09/29/2021   Preop examination 09/22/2021   Chills (without fever) 07/05/2021   Other fatigue 07/05/2021   Preventative health care 10/21/2020   Primary hypertension 10/21/2020   Estrogen deficiency 10/21/2020   Right hip pain 04/27/2020   Generalized anxiety disorder 08/10/2017   Seasonal allergies 03/08/2017   Abdominal pain 02/19/2014   Obesity (BMI 30-39.9) 10/02/2013   INCONTINENCE, 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 08/11/2009   LOW BACK PAIN, CHRONIC 08/11/2009   COLONIC POLYPS, HYPERPLASTIC, HX OF 08/17/2008   Acute upper respiratory infection 04/17/2008   SINUSITIS- ACUTE-NOS 03/30/2008   Hyperlipidemia 09/26/2007   Anxiety state 01/01/2007   CYST, SEBACEOUS 01/01/2007   OSTEOPENIA 01/01/2007   ARTHROSCOPY, RIGHT KNEE, HX OF 01/01/2007    PCP: [name],  DO  REFERRING PROVIDER: Meredith Pel, MD  REFERRING DIAG: [phone] (ICD-10-CM) - S/P total knee arthroplasty, left  THERAPY DIAG:  Acute pain of left knee  Stiffness of left knee, not elsewhere classified  Muscle weakness (generalized)  Localized edema  Other abnormalities of gait and mobility  Rationale for Evaluation and Treatment Rehabilitation  ONSET DATE: Lt TKA 09/29/21  SUBJECTIVE:   SUBJECTIVE STATEMENT: She had TKA and had HHPT that finished on Monday.   PERTINENT HISTORY: PMH:Lt TKA,osteopenia,anx,chronic LBP  PAIN:  Are you having pain? Yes: NPRS scale: 3-5/10 Pain location: anterior knee Pain description: tightness, constant ache Aggravating factors: privot, sleeping Relieving factors: movement  PRECAUTIONS: None  WEIGHT BEARING RESTRICTIONS No  FALLS:  Has patient fallen in last 6 months? No  LIVING ENVIRONMENT: 2 steps to enter with handrail on left  OCCUPATION: retired  PLOF: Independent  PATIENT GOALS , get her knee better   OBJECTIVE:   DIAGNOSTIC FINDINGS: AP and lateral views of left knee reviewed.  Left total knee prosthesis in  good position and alignment without any complicating features.  No  evidence of loosening.  No periprosthetic fracture.  PATIENT SURVEYS:  FOTO 41% functional intake at [facility]  COGNITION:  Overall cognitive status: Within functional limits for tasks assessed     SENSATION:   EDEMA:  Moderate edema noted in Lt knee  MUSCLE LENGTH:   POSTURE:   PALPATION: TTP anterior knee  LOWER EXTREMITY ROM:  AROM/PROM Right eval Left eval  Hip flexion    Hip extension    Hip abduction    Hip adduction    Hip internal rotation    Hip external rotation    Knee flexion  100/106  Knee extension  7/5 from 0  Ankle dorsiflexion    Ankle plantarflexion    Ankle inversion    Ankle eversion     (Blank rows = not tested)  LOWER EXTREMITY MMT:  MMT Right eval Left eval  Hip flexion 4+ 4  Hip  extension    Hip abduction 4+ 4+  Hip adduction    Hip internal rotation    Hip external rotation    Knee flexion 5 4  Knee extension 5 4  Ankle dorsiflexion    Ankle plantarflexion    Ankle inversion    Ankle eversion     (Blank rows = not tested)  LOWER EXTREMITY SPECIAL TESTS:    FUNCTIONAL TESTS:  10/19/21  5 times sit to stand: 13.4 seconds and must use UE support  GAIT: Distance walked: 100 Assistive device utilized: Environmental consultant - 2 wheeled Level of assistance: Modified independence Comments: slower velocity, decreased hip/knee flexion    TODAY'S TREATMENT: Reviewed and performed 10 reps of HEP listed below Nu step X 8 min L5 UE/LE Manual therapy for Left knee PROM to tolerance   PATIENT EDUCATION:  Education details: HEP, PT plan of care Person educated: Patient Education method: Explanation, Demonstration, Verbal cues, and Handouts Education comprehension: verbalized understanding   HOME EXERCISE PROGRAM: Access Code: [email or web address] URL: [email or web address] Date: 10/19/2021 Prepared by: Elsie Ra  Exercises - Supine Heel Slide with Strap  - 2 x daily - 6 x weekly - 1-2 sets - 10 reps - 5 hold - Seated Knee Flexion Stretch  - 2 x daily - 6 x weekly - 1-2 sets - 10 reps - 5 sec hold - Seated Hamstring Stretch  - 2 x daily - 6 x weekly - 1 sets - 3 reps - 30 hold - Seated Straight Leg Heel Taps  - 2 x daily - 6 x weekly - 2-3 sets - 10 reps - Mini Squat with Counter Support  - 2 x daily - 6 x weekly - 1-2 sets - 10 reps - Heel Prop  - 2 x daily - 6 x weekly - 1 sets - 1 reps - 5 min hold   ASSESSMENT:  CLINICAL IMPRESSION: Patient referred to PT S/P Lt TKA on 09/24/21. She is overall doing very well up to this point. Patient will benefit from skilled PT to address below impairments, limitations and improve overall function.  OBJECTIVE IMPAIRMENTS: decreased activity tolerance, difficulty walking, decreased balance, decreased endurance, decreased  mobility, decreased ROM, decreased strength, impaired flexibility, impaired LE use, postural dysfunction, and pain.  ACTIVITY LIMITATIONS: bending, lifting, carry, locomotion, cleaning, community activity, driving  PERSONAL FACTORS: Lt TKA,osteopenia,anx,chronic LBP are also affecting patient's functional outcome.  REHAB POTENTIAL: Excellent  CLINICAL DECISION MAKING: Stable/uncomplicated  EVALUATION COMPLEXITY: Low    GOALS: Short term PT Goals Target date: 11/16/2021 Pt will be I and compliant with HEP. Baseline:  Goal status: New Pt will decrease pain by 50% overall Baseline: Goal status: New  Long term PT goals Target date: 12/14/2021 Pt will improve Lt knee PROM 0-120 deg to improve functional mobility Baseline: Goal status: New Pt will  improve  Lt hip/knee strength to at least 5-/5 MMT to improve functional strength Baseline: Goal status: New Pt will improve FOTO to at least 59% functional to show improved function Baseline: Goal status: New Pt will reduce pain to overall less than 2-3/10 with usual activity and work activity. Baseline: Goal status: New Pt will be able to improve 5 times sit to stand test to 12 seconds or less with UE support or less than 13 seconds without UE support to show improved functional leg strength, endurance, and balance  PLAN: PT FREQUENCY: 1-3 times per week   PT DURATION: 6-8 weeks  PLANNED INTERVENTIONS (unless contraindicated): aquatic PT, Canalith repositioning, cryotherapy, Electrical stimulation, Iontophoresis with 4 mg/ml dexamethasome, Moist heat, traction, Ultrasound, gait training, Therapeutic exercise, balance training, neuromuscular re-education, patient/family education, prosthetic training, manual techniques, passive ROM, dry needling, taping, vasopnuematic device, vestibular, spinal manipulations, joint manipulations  PLAN FOR NEXT SESSION: review HEP and progress ROM with extension focus and quad strengthening to  tolerance.    Debbe Odea, PT,DPT 10/19/2021, 9:30 AM

## 2021-10-25 ENCOUNTER — Encounter: Payer: Self-pay | Admitting: Physical Therapy

## 2021-10-25 ENCOUNTER — Ambulatory Visit: Payer: Medicare Other | Admitting: Physical Therapy

## 2021-10-25 DIAGNOSIS — M6281 Muscle weakness (generalized): Secondary | ICD-10-CM | POA: Diagnosis not present

## 2021-10-25 DIAGNOSIS — R600 Localized edema: Secondary | ICD-10-CM

## 2021-10-25 DIAGNOSIS — M25562 Pain in left knee: Secondary | ICD-10-CM

## 2021-10-25 DIAGNOSIS — M25662 Stiffness of left knee, not elsewhere classified: Secondary | ICD-10-CM | POA: Diagnosis not present

## 2021-10-25 DIAGNOSIS — R2689 Other abnormalities of gait and mobility: Secondary | ICD-10-CM

## 2021-10-25 NOTE — Therapy (Signed)
OUTPATIENT PHYSICAL THERAPY TREATMENT NOTE   Patient Name: Kaelea Gathright MRN: [id number] DOB:[birth year], 76 y.o., female Today's Date: 10/25/2021  PCP: [name], DO REFERRING PROVIDER: Meredith Pel, MD  END OF SESSION:   PT End of Session - 10/25/21 1149     Visit Number 2    Number of Visits 15    Date for PT Re-Evaluation 12/14/21    Authorization Type BCBS MCR    Progress Note Due on Visit 10    PT Start Time 7622    PT Stop Time 1223    PT Time Calculation (min) 39 min    Activity Tolerance Patient tolerated treatment well    Behavior During Therapy WFL for tasks assessed/performed             Past Medical History:  Diagnosis Date   Anxiety    Arthritis    knee   Colon polyps    hyperplastic and Adenomatous   Hyperlipidemia    Osteopenia    Past Surgical History:  Procedure Laterality Date   CARPAL TUNNEL RELEASE Left    COLONOSCOPY  07/2012   Henrene Pastor - polyps   KNEE ARTHROSCOPY Right    TOTAL KNEE ARTHROPLASTY Left 09/29/2021   Procedure: LEFT TOTAL KNEE ARTHROPLASTY;  Surgeon: Meredith Pel, MD;  Location: [place];  Service: Orthopedics;  Laterality: Left;   WISDOM TOOTH EXTRACTION     Patient Active Problem List   Diagnosis Date Noted   Arthritis of left knee    S/P total knee arthroplasty, left 09/29/2021   Preop examination 09/22/2021   Chills (without fever) 07/05/2021   Other fatigue 07/05/2021   Preventative health care 10/21/2020   Primary hypertension 10/21/2020   Estrogen deficiency 10/21/2020   Right hip pain 04/27/2020   Generalized anxiety disorder 08/10/2017   Seasonal allergies 03/08/2017   Abdominal pain 02/19/2014   Obesity (BMI 30-39.9) 10/02/2013   INCONTINENCE, 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 08/11/2009   LOW BACK PAIN, CHRONIC 08/11/2009   COLONIC POLYPS, HYPERPLASTIC, HX OF 08/17/2008   Acute upper respiratory infection 04/17/2008   SINUSITIS- ACUTE-NOS 03/30/2008   Hyperlipidemia 09/26/2007   Anxiety state  01/01/2007   CYST, SEBACEOUS 01/01/2007   OSTEOPENIA 01/01/2007   ARTHROSCOPY, RIGHT KNEE, HX OF 01/01/2007    REFERRING DIAG: Q33.354 (ICD-10-CM) - S/P total knee arthroplasty, left  THERAPY DIAG:  Acute pain of left knee  Stiffness of left knee, not elsewhere classified  Muscle weakness (generalized)  Localized edema  Other abnormalities of gait and mobility  Rationale for Evaluation and Treatment Rehabilitation  PERTINENT HISTORY: Lt TKA,osteopenia,anx,chronic LBP  PRECAUTIONS: None  SUBJECTIVE: "I was doing better last week.  It just hurts so much."  PAIN:  Are you having pain? Yes: NPRS scale: 5 currently, was up to 7/10 Pain location: Lt knee Pain description: aching Aggravating factors: increased activity, bending knee Relieving factors: tylenol   OBJECTIVE: (objective measures completed at initial evaluation unless otherwise dated)   PATIENT SURVEYS:  FOTO 41% functional intake at [place] ROM:   AROM/PROM Right eval Left eval Left 10/25/21  Hip flexion       Hip extension       Hip abduction       Hip adduction       Hip internal rotation       Hip external rotation       Knee flexion   100/106 117  Knee extension   7/5 from 0 -3 supine -  8 seated LAQ  Ankle dorsiflexion       Ankle plantarflexion       Ankle inversion       Ankle eversion        (Blank rows = not tested)   LOWER EXTREMITY MMT:   MMT Right eval Left eval  Hip flexion 4+ 4  Hip extension      Hip abduction 4+ 4+  Hip adduction      Hip internal rotation      Hip external rotation      Knee flexion 5 4  Knee extension 5 4  Ankle dorsiflexion      Ankle plantarflexion      Ankle inversion      Ankle eversion       (Blank rows = not tested)   LOWER EXTREMITY SPECIAL TESTS:      FUNCTIONAL TESTS:  10/19/21  5 times sit to stand: 13.4 seconds and must use UE support   GAIT: Distance walked: 100 Assistive device utilized: Environmental consultant - 2 wheeled Level  of assistance: Modified independence Comments: slower velocity, decreased hip/knee flexion       TODAY'S TREATMENT: 10/25/21 Therex:      Aerobic: Recumbent bike partial to full revolutions x 8 min, seat 7     Sitting: AA knee flexion 10 x 5 sec hold; RLE providing overpressure to LLE LLE SLR x 10 reps Lt hamstring stretch 3x30 sec - gentle overpressure at distal thigh Lt LAQ with 4# 3x10     Supine: AA heel slides x 10 reps with strap  10/19/21 Reviewed and performed 10 reps of HEP listed below Nu step X 8 min L5 UE/LE Manual therapy for Left knee PROM to tolerance     PATIENT EDUCATION:  Education details: HEP, PT plan of care Person educated: Patient Education method: Explanation, Demonstration, Verbal cues, and Handouts Education comprehension: verbalized understanding     HOME EXERCISE PROGRAM: Access Code: [email or web address] URL: [email or web address] Date: 10/19/2021 Prepared by: Elsie Ra   Exercises - Supine Heel Slide with Strap  - 2 x daily - 6 x weekly - 1-2 sets - 10 reps - 5 hold - Seated Knee Flexion Stretch  - 2 x daily - 6 x weekly - 1-2 sets - 10 reps - 5 sec hold - Seated Hamstring Stretch  - 2 x daily - 6 x weekly - 1 sets - 3 reps - 30 hold - Seated Straight Leg Heel Taps  - 2 x daily - 6 x weekly - 2-3 sets - 10 reps - Mini Squat with Counter Support  - 2 x daily - 6 x weekly - 1-2 sets - 10 reps - Heel Prop  - 2 x daily - 6 x weekly - 1 sets - 1 reps - 5 min hold     ASSESSMENT:   CLINICAL IMPRESSION: Excellent progress in AROM noted today especially flexion.  Still has some difficulty with quad strength and amb with RW so will continue to work on strengthening and balance.  She prefers to amb with either RW or no device at this time.  Will continue to benefit from PT to maximize function.    OBJECTIVE IMPAIRMENTS: decreased activity tolerance, difficulty walking, decreased balance, decreased endurance, decreased mobility, decreased ROM,  decreased strength, impaired flexibility, impaired LE use, postural dysfunction, and pain.   ACTIVITY LIMITATIONS: bending, lifting, carry, locomotion, cleaning, community activity, driving   PERSONAL FACTORS: Lt TKA,osteopenia,anx,chronic LBP are also affecting patient's functional outcome.  REHAB POTENTIAL: Excellent   CLINICAL DECISION MAKING: Stable/uncomplicated   EVALUATION COMPLEXITY: Low       GOALS: Short term PT Goals Target date: 11/16/2021 Pt will be I and compliant with HEP. Baseline:  Goal status: MET 10/25/21 Pt will decrease pain by 50% overall Baseline: Goal status: New   Long term PT goals Target date: 12/14/2021 Pt will improve Lt knee PROM 0-120 deg to improve functional mobility Baseline: Goal status: New Pt will improve  Lt hip/knee strength to at least 5-/5 MMT to improve functional strength Baseline: Goal status: New Pt will improve FOTO to at least 59% functional to show improved function Baseline: Goal status: New Pt will reduce pain to overall less than 2-3/10 with usual activity and work activity. Baseline: Goal status: New Pt will be able to improve 5 times sit to stand test to 12 seconds or less with UE support or less than 13 seconds without UE support to show improved functional leg strength, endurance, and balance   PLAN: PT FREQUENCY: 1-3 times per week    PT DURATION: 6-8 weeks   PLANNED INTERVENTIONS (unless contraindicated): aquatic PT, Canalith repositioning, cryotherapy, Electrical stimulation, Iontophoresis with 4 mg/ml dexamethasome, Moist heat, traction, Ultrasound, gait training, Therapeutic exercise, balance training, neuromuscular re-education, patient/family education, prosthetic training, manual techniques, passive ROM, dry needling, taping, vasopnuematic device, vestibular, spinal manipulations, joint manipulations   PLAN FOR NEXT SESSION: maintain ROM, quad strengthening to tolerance, balance, gait without  device     Laureen Abrahams, PT, DPT 10/25/21 12:29 PM

## 2021-10-27 ENCOUNTER — Encounter: Payer: Self-pay | Admitting: Physical Therapy

## 2021-10-27 ENCOUNTER — Ambulatory Visit: Payer: Medicare Other | Admitting: Physical Therapy

## 2021-10-27 DIAGNOSIS — R600 Localized edema: Secondary | ICD-10-CM

## 2021-10-27 DIAGNOSIS — M25662 Stiffness of left knee, not elsewhere classified: Secondary | ICD-10-CM | POA: Diagnosis not present

## 2021-10-27 DIAGNOSIS — M25562 Pain in left knee: Secondary | ICD-10-CM | POA: Diagnosis not present

## 2021-10-27 DIAGNOSIS — M6281 Muscle weakness (generalized): Secondary | ICD-10-CM | POA: Diagnosis not present

## 2021-10-27 DIAGNOSIS — R2689 Other abnormalities of gait and mobility: Secondary | ICD-10-CM

## 2021-10-27 NOTE — Therapy (Signed)
OUTPATIENT PHYSICAL THERAPY TREATMENT NOTE   Patient Name: Allison Cox MRN: [id number] DOB:Mar 12, 1946, 76 y.o., female Today's Date: 10/27/2021  PCP: [name], DO REFERRING PROVIDER: Meredith Pel, MD  END OF SESSION:   PT End of Session - 10/27/21 1145     Visit Number 3    Number of Visits 15    Date for PT Re-Evaluation 12/14/21    Authorization Type BCBS MCR    Progress Note Due on Visit 10    PT Start Time 1142    PT Stop Time 1220    PT Time Calculation (min) 38 min    Activity Tolerance Patient tolerated treatment well    Behavior During Therapy WFL for tasks assessed/performed              Past Medical History:  Diagnosis Date   Anxiety    Arthritis    knee   Colon polyps    hyperplastic and Adenomatous   Hyperlipidemia    Osteopenia    Past Surgical History:  Procedure Laterality Date   CARPAL TUNNEL RELEASE Left    COLONOSCOPY  07/2012   Henrene Pastor - polyps   KNEE ARTHROSCOPY Right    TOTAL KNEE ARTHROPLASTY Left 09/29/2021   Procedure: LEFT TOTAL KNEE ARTHROPLASTY;  Surgeon: Meredith Pel, MD;  Location: [place];  Service: Orthopedics;  Laterality: Left;   WISDOM TOOTH EXTRACTION     Patient Active Problem List   Diagnosis Date Noted   Arthritis of left knee    S/P total knee arthroplasty, left 09/29/2021   Preop examination 09/22/2021   Chills (without fever) 07/05/2021   Other fatigue 07/05/2021   Preventative health care 10/21/2020   Primary hypertension 10/21/2020   Estrogen deficiency 10/21/2020   Right hip pain 04/27/2020   Generalized anxiety disorder 08/10/2017   Seasonal allergies 03/08/2017   Abdominal pain 02/19/2014   Obesity (BMI 30-39.9) 10/02/2013   INCONTINENCE, 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 08/11/2009   LOW BACK PAIN, CHRONIC 08/11/2009   COLONIC POLYPS, HYPERPLASTIC, HX OF 08/17/2008   Acute upper respiratory infection 04/17/2008   SINUSITIS- ACUTE-NOS 03/30/2008   Hyperlipidemia 09/26/2007   Anxiety state  01/01/2007   CYST, SEBACEOUS 01/01/2007   OSTEOPENIA 01/01/2007   ARTHROSCOPY, RIGHT KNEE, HX OF 01/01/2007    REFERRING DIAG: I71.855 (ICD-10-CM) - S/P total knee arthroplasty, left  THERAPY DIAG:  Acute pain of left knee  Stiffness of left knee, not elsewhere classified  Muscle weakness (generalized)  Localized edema  Other abnormalities of gait and mobility  Rationale for Evaluation and Treatment Rehabilitation  PERTINENT HISTORY: Lt TKA,osteopenia,anx,chronic LBP  PRECAUTIONS: None  SUBJECTIVE: Feels like her knee is doing better today; did get on her exercise bike at home  PAIN:  Are you having pain? Yes: NPRS scale: 3-4/10 Pain location: Lt knee Pain description: aching Aggravating factors: increased activity, bending knee Relieving factors: tylenol   OBJECTIVE: (objective measures completed at initial evaluation unless otherwise dated)   PATIENT SURVEYS:  FOTO 41% functional intake at [place] ROM:   AROM/PROM Right eval Left eval Left 10/25/21  Hip flexion       Hip extension       Hip abduction       Hip adduction       Hip internal rotation       Hip external rotation       Knee flexion   100/106 117  Knee extension   7/5 from 0 -3 supine -  8 seated LAQ  Ankle dorsiflexion       Ankle plantarflexion       Ankle inversion       Ankle eversion        (Blank rows = not tested)   LOWER EXTREMITY MMT:   MMT Right eval Left eval  Hip flexion 4+ 4  Hip extension      Hip abduction 4+ 4+  Hip adduction      Hip internal rotation      Hip external rotation      Knee flexion 5 4  Knee extension 5 4  Ankle dorsiflexion      Ankle plantarflexion      Ankle inversion      Ankle eversion       (Blank rows = not tested)    FUNCTIONAL TESTS:  10/19/21  5 times sit to stand: 13.4 seconds and must use UE support   GAIT: 10/19/21: Distance walked: 100 Assistive device utilized: Environmental consultant - 2 wheeled Level of assistance: Modified  independence Comments: slower velocity, decreased hip/knee flexion       TODAY'S TREATMENT: 10/27/21 Therex:      Aerobic: NuStep L5 x 8 min     Machines: Leg press bil 100# 3x10; LLE only 37#      Standing: Slantboard stretch 3x30 sec Forward step ups onto 4" step (1 UE support) 2x10 Lateral step ups onto 4" step (2 UE support) 2x10      Sitting: LAQ 4# 3x10; LLE Lt hamstring stretch 3x30 sec - gentle overpressure at distal thigh Sit to/from stand x10 reps without UE support  10/25/21 Therex:      Aerobic: Recumbent bike partial to full revolutions x 8 min, seat 7     Sitting: AA knee flexion 10 x 5 sec hold; RLE providing overpressure to LLE LLE SLR x 10 reps Lt hamstring stretch 3x30 sec - gentle overpressure at distal thigh Lt LAQ with 4# 3x10     Supine: AA heel slides x 10 reps with strap  10/19/21 Reviewed and performed 10 reps of HEP listed below Nu step X 8 min L5 UE/LE Manual therapy for Left knee PROM to tolerance     PATIENT EDUCATION:  Education details: HEP, PT plan of care Person educated: Patient Education method: Explanation, Demonstration, Verbal cues, and Handouts Education comprehension: verbalized understanding     HOME EXERCISE PROGRAM: Access Code: [email or web address] URL: [email or web address] Date: 10/19/2021 Prepared by: Elsie Ra   Exercises - Supine Heel Slide with Strap  - 2 x daily - 6 x weekly - 1-2 sets - 10 reps - 5 hold - Seated Knee Flexion Stretch  - 2 x daily - 6 x weekly - 1-2 sets - 10 reps - 5 sec hold - Seated Hamstring Stretch  - 2 x daily - 6 x weekly - 1 sets - 3 reps - 30 hold - Seated Straight Leg Heel Taps  - 2 x daily - 6 x weekly - 2-3 sets - 10 reps - Mini Squat with Counter Support  - 2 x daily - 6 x weekly - 1-2 sets - 10 reps - Heel Prop  - 2 x daily - 6 x weekly - 1 sets - 1 reps - 5 min hold     ASSESSMENT:   CLINICAL IMPRESSION:  Pt tolerated progression of strengthening and standing exercises  today with expected fatigue.  Amb in clinic without RW today without significant instability.  Will continue to benefit from PT  to maximize function.    OBJECTIVE IMPAIRMENTS: decreased activity tolerance, difficulty walking, decreased balance, decreased endurance, decreased mobility, decreased ROM, decreased strength, impaired flexibility, impaired LE use, postural dysfunction, and pain.   ACTIVITY LIMITATIONS: bending, lifting, carry, locomotion, cleaning, community activity, driving   PERSONAL FACTORS: Lt TKA,osteopenia,anx,chronic LBP are also affecting patient's functional outcome.   REHAB POTENTIAL: Excellent   CLINICAL DECISION MAKING: Stable/uncomplicated   EVALUATION COMPLEXITY: Low       GOALS: Short term PT Goals Target date: 11/16/2021 Pt will be I and compliant with HEP. Baseline:  Goal status: MET 10/25/21 Pt will decrease pain by 50% overall Baseline: Goal status: New   Long term PT goals Target date: 12/14/2021 Pt will improve Lt knee PROM 0-120 deg to improve functional mobility Baseline: Goal status: New Pt will improve  Lt hip/knee strength to at least 5-/5 MMT to improve functional strength Baseline: Goal status: New Pt will improve FOTO to at least 59% functional to show improved function Baseline: Goal status: New Pt will reduce pain to overall less than 2-3/10 with usual activity and work activity. Baseline: Goal status: New Pt will be able to improve 5 times sit to stand test to 12 seconds or less with UE support or less than 13 seconds without UE support to show improved functional leg strength, endurance, and balance   PLAN: PT FREQUENCY: 1-3 times per week    PT DURATION: 6-8 weeks   PLANNED INTERVENTIONS (unless contraindicated): aquatic PT, Canalith repositioning, cryotherapy, Electrical stimulation, Iontophoresis with 4 mg/ml dexamethasome, Moist heat, traction, Ultrasound, gait training, Therapeutic exercise, balance training, neuromuscular  re-education, patient/family education, prosthetic training, manual techniques, passive ROM, dry needling, taping, vasopnuematic device, vestibular, spinal manipulations, joint manipulations   PLAN FOR NEXT SESSION: maintain ROM, quad strengthening to tolerance, balance, gait without device     Laureen Abrahams, PT, DPT 10/27/21 12:21 PM

## 2021-10-31 ENCOUNTER — Ambulatory Visit: Payer: Medicare Other | Admitting: Physical Therapy

## 2021-10-31 ENCOUNTER — Encounter: Payer: Self-pay | Admitting: Physical Therapy

## 2021-10-31 DIAGNOSIS — M6281 Muscle weakness (generalized): Secondary | ICD-10-CM | POA: Diagnosis not present

## 2021-10-31 DIAGNOSIS — M25662 Stiffness of left knee, not elsewhere classified: Secondary | ICD-10-CM

## 2021-10-31 DIAGNOSIS — M25562 Pain in left knee: Secondary | ICD-10-CM | POA: Diagnosis not present

## 2021-10-31 DIAGNOSIS — R600 Localized edema: Secondary | ICD-10-CM

## 2021-10-31 DIAGNOSIS — R2689 Other abnormalities of gait and mobility: Secondary | ICD-10-CM

## 2021-10-31 NOTE — Therapy (Signed)
OUTPATIENT PHYSICAL THERAPY TREATMENT NOTE   Patient Name: Allison Cox MRN: [id number] DOB:1945/09/25, 76 y.o., female Today's Date: 10/31/2021  PCP: [name], DO REFERRING PROVIDER: Meredith Pel, MD  END OF SESSION:   PT End of Session - 10/31/21 1026     Visit Number 4    Number of Visits 15    Date for PT Re-Evaluation 12/14/21    Authorization Type BCBS MCR    Progress Note Due on Visit 10    PT Start Time 1020    PT Stop Time 1100    PT Time Calculation (min) 40 min    Activity Tolerance Patient tolerated treatment well    Behavior During Therapy WFL for tasks assessed/performed              Past Medical History:  Diagnosis Date   Anxiety    Arthritis    knee   Colon polyps    hyperplastic and Adenomatous   Hyperlipidemia    Osteopenia    Past Surgical History:  Procedure Laterality Date   CARPAL TUNNEL RELEASE Left    COLONOSCOPY  07/2012   Henrene Pastor - polyps   KNEE ARTHROSCOPY Right    TOTAL KNEE ARTHROPLASTY Left 09/29/2021   Procedure: LEFT TOTAL KNEE ARTHROPLASTY;  Surgeon: Meredith Pel, MD;  Location: [place];  Service: Orthopedics;  Laterality: Left;   WISDOM TOOTH EXTRACTION     Patient Active Problem List   Diagnosis Date Noted   Arthritis of left knee    S/P total knee arthroplasty, left 09/29/2021   Preop examination 09/22/2021   Chills (without fever) 07/05/2021   Other fatigue 07/05/2021   Preventative health care 10/21/2020   Primary hypertension 10/21/2020   Estrogen deficiency 10/21/2020   Right hip pain 04/27/2020   Generalized anxiety disorder 08/10/2017   Seasonal allergies 03/08/2017   Abdominal pain 02/19/2014   Obesity (BMI 30-39.9) 10/02/2013   INCONTINENCE, 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 08/11/2009   LOW BACK PAIN, CHRONIC 08/11/2009   COLONIC POLYPS, HYPERPLASTIC, HX OF 08/17/2008   Acute upper respiratory infection 04/17/2008   SINUSITIS- ACUTE-NOS 03/30/2008   Hyperlipidemia 09/26/2007   Anxiety state  01/01/2007   CYST, SEBACEOUS 01/01/2007   OSTEOPENIA 01/01/2007   ARTHROSCOPY, RIGHT KNEE, HX OF 01/01/2007    REFERRING DIAG: Q68.341 (ICD-10-CM) - S/P total knee arthroplasty, left  THERAPY DIAG:  Acute pain of left knee  Stiffness of left knee, not elsewhere classified  Muscle weakness (generalized)  Localized edema  Other abnormalities of gait and mobility  Rationale for Evaluation and Treatment Rehabilitation  PERTINENT HISTORY: Lt TKA,osteopenia,anx,chronic LBP  PRECAUTIONS: None  SUBJECTIVE: Knee is doing ok PAIN:  Are you having pain? Yes: NPRS scale: 4/10 Pain location: Lt knee Pain description: aching Aggravating factors: increased activity, bending knee Relieving factors: tylenol   OBJECTIVE: (objective measures completed at initial evaluation unless otherwise dated)   PATIENT SURVEYS:  FOTO 41% functional intake at [place] ROM:   AROM/PROM Right eval Left eval Left 10/25/21  Hip flexion       Hip extension       Hip abduction       Hip adduction       Hip internal rotation       Hip external rotation       Knee flexion   100/106 117  Knee extension   7/5 from 0 -3 supine -8 seated LAQ  Ankle dorsiflexion       Ankle  plantarflexion       Ankle inversion       Ankle eversion        (Blank rows = not tested)   LOWER EXTREMITY MMT:   MMT Right eval Left eval  Hip flexion 4+ 4  Hip extension      Hip abduction 4+ 4+  Hip adduction      Hip internal rotation      Hip external rotation      Knee flexion 5 4  Knee extension 5 4  Ankle dorsiflexion      Ankle plantarflexion      Ankle inversion      Ankle eversion       (Blank rows = not tested)    FUNCTIONAL TESTS:  10/19/21  5 times sit to stand: 13.4 seconds and must use UE support   GAIT: 10/19/21: Distance walked: 100 Assistive device utilized: Environmental consultant - 2 wheeled Level of assistance: Modified independence Comments: slower velocity, decreased hip/knee  flexion       TODAY'S TREATMENT: 10/31/21 Therex:      Aerobic: NuStep L5 x 8 min     Machines: Leg press bil 100# 3x10; LLE only 37# 3X10     Standing: Slantboard stretch 3x30 sec Forward step ups onto 6" step (1 UE support) x10 Lateral step ups onto 6" step (2 UE support) x10      Sitting: LAQ 5# 3x10; LLE Hamstring curl green 2X15 on left Sit to/from stand x10 reps without UE support Manual therapy  Lt knee PROM with emphasis on extension, extension mobs grade 2, and manual hamstring stretching  10/27/21 Therex:      Aerobic: NuStep L5 x 8 min     Machines: Leg press bil 100# 3x10; LLE only 37#      Standing: Slantboard stretch 3x30 sec Forward step ups onto 4" step (1 UE support) 2x10 Lateral step ups onto 4" step (2 UE support) 2x10      Sitting: LAQ 4# 3x10; LLE Lt hamstring stretch 3x30 sec - gentle overpressure at distal thigh Sit to/from stand x10 reps without UE support   PATIENT EDUCATION:  Education details: HEP, PT plan of care Person educated: Patient Education method: Explanation, Demonstration, Verbal cues, and Handouts Education comprehension: verbalized understanding     HOME EXERCISE PROGRAM: Access Code: [email or web address] URL: [email or web address] Date: 10/19/2021 Prepared by: Elsie Ra   Exercises - Supine Heel Slide with Strap  - 2 x daily - 6 x weekly - 1-2 sets - 10 reps - 5 hold - Seated Knee Flexion Stretch  - 2 x daily - 6 x weekly - 1-2 sets - 10 reps - 5 sec hold - Seated Hamstring Stretch  - 2 x daily - 6 x weekly - 1 sets - 3 reps - 30 hold - Seated Straight Leg Heel Taps  - 2 x daily - 6 x weekly - 2-3 sets - 10 reps - Mini Squat with Counter Support  - 2 x daily - 6 x weekly - 1-2 sets - 10 reps - Heel Prop  - 2 x daily - 6 x weekly - 1 sets - 1 reps - 5 min hold     ASSESSMENT:   CLINICAL IMPRESSION:  She has met Short term goals and is progressing well, now has met her knee flexion ROM goal but still missing some  extension ROM and knee strength that we will continue to work to progress in PT as tolerated.    OBJECTIVE  IMPAIRMENTS: decreased activity tolerance, difficulty walking, decreased balance, decreased endurance, decreased mobility, decreased ROM, decreased strength, impaired flexibility, impaired LE use, postural dysfunction, and pain.   ACTIVITY LIMITATIONS: bending, lifting, carry, locomotion, cleaning, community activity, driving   PERSONAL FACTORS: Lt TKA,osteopenia,anx,chronic LBP are also affecting patient's functional outcome.   REHAB POTENTIAL: Excellent   CLINICAL DECISION MAKING: Stable/uncomplicated   EVALUATION COMPLEXITY: Low       GOALS: Short term PT Goals Target date: 11/16/2021 Pt will be I and compliant with HEP. Baseline:  Goal status: MET 10/25/21 Pt will decrease pain by 50% overall Baseline: Goal status: MET   Long term PT goals Target date: 12/14/2021 Pt will improve Lt knee PROM 0-120 deg to improve functional mobility Baseline: Goal status: ongoing now 5-120 deg 6/19 Pt will improve  Lt hip/knee strength to at least 5-/5 MMT to improve functional strength Baseline: Goal status: ongoing  Pt will improve FOTO to at least 59% functional to show improved function Baseline: Goal status: ongoing Pt will reduce pain to overall less than 2-3/10 with usual activity and work activity. Baseline: Goal status: ongoing 4 overall now 6/19 Pt will be able to improve 5 times sit to stand test to 12 seconds or less with UE support or less than 13 seconds without UE support to show improved functional leg strength, endurance, and balance Goal status: ongoing   PLAN: PT FREQUENCY: 1-3 times per week    PT DURATION: 6-8 weeks   PLANNED INTERVENTIONS (unless contraindicated): aquatic PT, Canalith repositioning, cryotherapy, Electrical stimulation, Iontophoresis with 4 mg/ml dexamethasome, Moist heat, traction, Ultrasound, gait training, Therapeutic exercise, balance  training, neuromuscular re-education, patient/family education, prosthetic training, manual techniques, passive ROM, dry needling, taping, vasopnuematic device, vestibular, spinal manipulations, joint manipulations   PLAN FOR NEXT SESSION: extension ROM, quad strengthening to tolerance, balance, gait without device    Elsie Ra, PT, DPT 10/31/21 10:59 AM

## 2021-11-03 ENCOUNTER — Ambulatory Visit: Payer: Medicare Other | Admitting: Physical Therapy

## 2021-11-03 ENCOUNTER — Encounter: Payer: Self-pay | Admitting: Physical Therapy

## 2021-11-03 DIAGNOSIS — M25662 Stiffness of left knee, not elsewhere classified: Secondary | ICD-10-CM

## 2021-11-03 DIAGNOSIS — R600 Localized edema: Secondary | ICD-10-CM

## 2021-11-03 DIAGNOSIS — M6281 Muscle weakness (generalized): Secondary | ICD-10-CM

## 2021-11-03 DIAGNOSIS — M25562 Pain in left knee: Secondary | ICD-10-CM

## 2021-11-03 DIAGNOSIS — R2689 Other abnormalities of gait and mobility: Secondary | ICD-10-CM

## 2021-11-03 NOTE — Therapy (Signed)
OUTPATIENT PHYSICAL THERAPY TREATMENT NOTE   Patient Name: Allison Cox MRN: [id number] DOB:11/25/1945, 76 y.o., female Today's Date: 11/03/2021  PCP: [name], DO REFERRING PROVIDER: Meredith Pel, MD  END OF SESSION:   PT End of Session - 11/03/21 1019     Visit Number 5    Number of Visits 15    Date for PT Re-Evaluation 12/14/21    Authorization Type BCBS MCR    Progress Note Due on Visit 10    PT Start Time 1014    PT Stop Time 1055    PT Time Calculation (min) 41 min    Activity Tolerance Patient tolerated treatment well    Behavior During Therapy WFL for tasks assessed/performed              Past Medical History:  Diagnosis Date   Anxiety    Arthritis    knee   Colon polyps    hyperplastic and Adenomatous   Hyperlipidemia    Osteopenia    Past Surgical History:  Procedure Laterality Date   CARPAL TUNNEL RELEASE Left    COLONOSCOPY  07/2012   Henrene Pastor - polyps   KNEE ARTHROSCOPY Right    TOTAL KNEE ARTHROPLASTY Left 09/29/2021   Procedure: LEFT TOTAL KNEE ARTHROPLASTY;  Surgeon: Meredith Pel, MD;  Location: [place];  Service: Orthopedics;  Laterality: Left;   WISDOM TOOTH EXTRACTION     Patient Active Problem List   Diagnosis Date Noted   Arthritis of left knee    S/P total knee arthroplasty, left 09/29/2021   Preop examination 09/22/2021   Chills (without fever) 07/05/2021   Other fatigue 07/05/2021   Preventative health care 10/21/2020   Primary hypertension 10/21/2020   Estrogen deficiency 10/21/2020   Right hip pain 04/27/2020   Generalized anxiety disorder 08/10/2017   Seasonal allergies 03/08/2017   Abdominal pain 02/19/2014   Obesity (BMI 30-39.9) 10/02/2013   INCONTINENCE, 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 08/11/2009   LOW BACK PAIN, CHRONIC 08/11/2009   COLONIC POLYPS, HYPERPLASTIC, HX OF 08/17/2008   Acute upper respiratory infection 04/17/2008   SINUSITIS- ACUTE-NOS 03/30/2008   Hyperlipidemia 09/26/2007   Anxiety state  01/01/2007   CYST, SEBACEOUS 01/01/2007   OSTEOPENIA 01/01/2007   ARTHROSCOPY, RIGHT KNEE, HX OF 01/01/2007    REFERRING DIAG: D53.299 (ICD-10-CM) - S/P total knee arthroplasty, left  THERAPY DIAG:  Acute pain of left knee  Stiffness of left knee, not elsewhere classified  Muscle weakness (generalized)  Localized edema  Other abnormalities of gait and mobility  Rationale for Evaluation and Treatment Rehabilitation  PERTINENT HISTORY: Lt TKA,osteopenia,anx,chronic LBP  PRECAUTIONS: None  SUBJECTIVE: Knee is doing ok PAIN:  Are you having pain? Yes: NPRS scale: 4/10 Pain location: Lt knee Pain description: aching Aggravating factors: increased activity, bending knee Relieving factors: tylenol   OBJECTIVE: (objective measures completed at initial evaluation unless otherwise dated)   PATIENT SURVEYS:  FOTO 41% functional intake at [place] ROM:   AROM/PROM Left eval Left 10/25/21 Left 11/03/21  Hip flexion      Hip extension      Hip abduction      Hip adduction      Hip internal rotation      Hip external rotation      Knee flexion 100/106 117 110/120  Knee extension 7/5 from 0 -3 supine -8 seated LAQ -2/0  Ankle dorsiflexion      Ankle plantarflexion      Ankle inversion  Ankle eversion       (Blank rows = not tested)   LOWER EXTREMITY MMT:   MMT in sitting Right eval Left eval Left 11/03/21  Hip flexion 4+ 4 5  Hip extension       Hip abduction 4+ 4+ 5  Hip adduction       Hip internal rotation       Hip external rotation       Knee flexion _0 Knee extension _1 Ankle dorsiflexion       Ankle plantarflexion       Ankle inversion       Ankle eversion        (Blank rows = not tested)    FUNCTIONAL TESTS:  10/19/21  5 times sit to stand: 13.4 seconds and must use UE support 11/03/21 5TSTS test seconds 9.5 with UE support and 13.4 seconds without UE support   GAIT: 10/19/21: Distance walked: 100 Assistive device  utilized: Environmental consultant - 2 wheeled Level of assistance: Modified independence Comments: slower velocity, decreased hip/knee flexion  11/03/21 Can ambulate without AD now but does use SPC at times in community        TODAY'S TREATMENT:  11/03/21 Therex:      Aerobic: NuStep L5 x 8 min     Machines: Leg press bil 100# 3x10; LLE only 37# 3X10     Standing: Forward step ups onto 6" step (1 UE support) x10 Lateral step ups onto 6" step (2 UE support) x10      Sitting: LAQ machine 5# 2X10 left leg only Sit to/from stand x10 reps without UE support Manual therapy  Lt knee PROM with emphasis on extension, extension mobs grade 2, and manual hamstring stretching   PATIENT EDUCATION:  Education details: HEP, PT plan of care Person educated: Patient Education method: Explanation, Demonstration, Verbal cues, and Handouts Education comprehension: verbalized understanding     HOME EXERCISE PROGRAM: Access Code: [email or web address] URL: [email or web address] Date: 10/19/2021 Prepared by: Elsie Ra   Exercises - Supine Heel Slide with Strap  - 2 x daily - 6 x weekly - 1-2 sets - 10 reps - 5 hold - Seated Knee Flexion Stretch  - 2 x daily - 6 x weekly - 1-2 sets - 10 reps - 5 sec hold - Seated Hamstring Stretch  - 2 x daily - 6 x weekly - 1 sets - 3 reps - 30 hold - Seated Straight Leg Heel Taps  - 2 x daily - 6 x weekly - 2-3 sets - 10 reps - Mini Squat with Counter Support  - 2 x daily - 6 x weekly - 1-2 sets - 10 reps - Heel Prop  - 2 x daily - 6 x weekly - 1 sets - 1 reps - 5 min hold     ASSESSMENT:   CLINICAL IMPRESSION:  She has now met PT goals. She would like to come one more session then feels confident to discharge after that.    OBJECTIVE IMPAIRMENTS: decreased activity tolerance, difficulty walking, decreased balance, decreased endurance, decreased mobility, decreased ROM, decreased strength, impaired flexibility, impaired LE use, postural dysfunction, and pain.    ACTIVITY LIMITATIONS: bending, lifting, carry, locomotion, cleaning, community activity, driving   PERSONAL FACTORS: Lt TKA,osteopenia,anx,chronic LBP are also affecting patient's functional outcome.   REHAB POTENTIAL: Excellent   CLINICAL DECISION MAKING: Stable/uncomplicated   EVALUATION COMPLEXITY: Low       GOALS: Short term PT Goals Target date: 11/16/2021  Pt will be I and compliant with HEP. Baseline:  Goal status: MET 10/25/21 Pt will decrease pain by 50% overall Baseline: Goal status: MET   Long term PT goals Target date: 12/14/2021 Pt will improve Lt knee PROM 0-120 deg to improve functional mobility Baseline: Goal status: MET 6/22 Pt will improve  Lt hip/knee strength to at least 5-/5 MMT to improve functional strength Baseline: Goal status: MET 6/22 Pt will improve FOTO to at least 59% functional to show improved function Baseline: Goal status: MET 6/22 Pt will reduce pain to overall less than 2-3/10 with usual activity and work activity. Baseline: Goal status: ongoing 4 overall now 6/19 Pt will be able to improve 5 times sit to stand test to 12 seconds or less with UE support or less than 13 seconds without UE support to show improved functional leg strength, endurance, and balance Goal status: MET 6/22   PLAN: PT FREQUENCY: 1-3 times per week    PT DURATION: 6-8 weeks   PLANNED INTERVENTIONS (unless contraindicated): aquatic PT, Canalith repositioning, cryotherapy, Electrical stimulation, Iontophoresis with 4 mg/ml dexamethasome, Moist heat, traction, Ultrasound, gait training, Therapeutic exercise, balance training, neuromuscular re-education, patient/family education, prosthetic training, manual techniques, passive ROM, dry needling, taping, vasopnuematic device, vestibular, spinal manipulations, joint manipulations   PLAN FOR NEXT SESSION: final HEP review DC next visit and send DC note to MD, give T shirt    Elsie Ra, PT, DPT 11/03/21 10:23  AM

## 2021-11-08 ENCOUNTER — Encounter: Payer: Medicare Other | Admitting: Physical Therapy

## 2021-11-10 ENCOUNTER — Ambulatory Visit: Payer: Medicare Other | Admitting: Physical Therapy

## 2021-11-10 ENCOUNTER — Encounter: Payer: Self-pay | Admitting: Physical Therapy

## 2021-11-10 DIAGNOSIS — R600 Localized edema: Secondary | ICD-10-CM

## 2021-11-10 DIAGNOSIS — M25562 Pain in left knee: Secondary | ICD-10-CM | POA: Diagnosis not present

## 2021-11-10 DIAGNOSIS — R2689 Other abnormalities of gait and mobility: Secondary | ICD-10-CM

## 2021-11-10 DIAGNOSIS — M25662 Stiffness of left knee, not elsewhere classified: Secondary | ICD-10-CM

## 2021-11-10 DIAGNOSIS — M6281 Muscle weakness (generalized): Secondary | ICD-10-CM

## 2021-11-10 NOTE — Therapy (Signed)
OUTPATIENT PHYSICAL THERAPY TREATMENT NOTE/Discharge PHYSICAL THERAPY DISCHARGE SUMMARY  Visits from Start of Care: 6  Current functional level related to goals / functional outcomes: See below   Remaining deficits: See below   Education / Equipment: HEP  Plan: Patient agrees to discharge.  Patient goals were  met. Patient is being discharged due to meeting the stated rehab goals and she is pleased with her overall progress        Patient Name: Allison Cox MRN: [id number] DOB:08-30-1945, 76 y.o., female, female Today's Date: 11/10/2021  PCP: [name], DO REFERRING PROVIDER: Meredith Pel, MD  END OF SESSION:   PT End of Session - 11/10/21 1014     Visit Number 6    Number of Visits 15    Date for PT Re-Evaluation 12/14/21    Authorization Type BCBS MCR    Progress Note Due on Visit 10    PT Start Time 1012    PT Stop Time 1050    PT Time Calculation (min) 38 min    Activity Tolerance Patient tolerated treatment well    Behavior During Therapy WFL for tasks assessed/performed              Past Medical History:  Diagnosis Date   Anxiety    Arthritis    knee   Colon polyps    hyperplastic and Adenomatous   Hyperlipidemia    Osteopenia    Past Surgical History:  Procedure Laterality Date   CARPAL TUNNEL RELEASE Left    COLONOSCOPY  07/2012   Henrene Pastor - polyps   KNEE ARTHROSCOPY Right    TOTAL KNEE ARTHROPLASTY Left 09/29/2021   Procedure: LEFT TOTAL KNEE ARTHROPLASTY;  Surgeon: Meredith Pel, MD;  Location: [place];  Service: Orthopedics;  Laterality: Left;   WISDOM TOOTH EXTRACTION     Patient Active Problem List   Diagnosis Date Noted   Arthritis of left knee    S/P total knee arthroplasty, left 09/29/2021   Preop examination 09/22/2021   Chills (without fever) 07/05/2021   Other fatigue 07/05/2021   Preventative health care 10/21/2020   Primary hypertension 10/21/2020   Estrogen deficiency 10/21/2020   Right hip pain  04/27/2020   Generalized anxiety disorder 08/10/2017   Seasonal allergies 03/08/2017   Abdominal pain 02/19/2014   Obesity (BMI 30-39.9) 10/02/2013   INCONTINENCE, 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 08/11/2009   LOW BACK PAIN, CHRONIC 08/11/2009   COLONIC POLYPS, HYPERPLASTIC, HX OF 08/17/2008   Acute upper respiratory infection 04/17/2008   SINUSITIS- ACUTE-NOS 03/30/2008   Hyperlipidemia 09/26/2007   Anxiety state 01/01/2007   CYST, SEBACEOUS 01/01/2007   OSTEOPENIA 01/01/2007   ARTHROSCOPY, RIGHT KNEE, HX OF 01/01/2007    REFERRING DIAG: O16.073 (ICD-10-CM) - S/P total knee arthroplasty, left  THERAPY DIAG:  Acute pain of left knee  Stiffness of left knee, not elsewhere classified  Muscle weakness (generalized)  Localized edema  Other abnormalities of gait and mobility  Rationale for Evaluation and Treatment Rehabilitation  PERTINENT HISTORY: Lt TKA,osteopenia,anx,chronic LBP  PRECAUTIONS: None  SUBJECTIVE: Knee is doing, good she feels ready to discharge today. PAIN:  Are you having pain? Yes: NPRS scale: 2/10 Pain location: Lt knee Pain description: aching Aggravating factors: increased activity, bending knee Relieving factors: tylenol   OBJECTIVE: (objective measures completed at initial evaluation unless otherwise dated)   PATIENT SURVEYS:  FOTO 41% functional intake at [place] ROM:   AROM/PROM Left eval Left 10/25/21 Left 11/03/21  Hip flexion  Hip extension      Hip abduction      Hip adduction      Hip internal rotation      Hip external rotation      Knee flexion 100/106 117 110/120  Knee extension 7/5 from 0 -3 supine -8 seated LAQ -2/0  Ankle dorsiflexion      Ankle plantarflexion      Ankle inversion      Ankle eversion       (Blank rows = not tested)   LOWER EXTREMITY MMT:   MMT in sitting Right eval Left eval Left 11/03/21  Hip flexion 4+ 4 5  Hip extension       Hip abduction 4+ 4+ 5  Hip adduction       Hip internal  rotation       Hip external rotation       Knee flexion _0 Knee extension _1 Ankle dorsiflexion       Ankle plantarflexion       Ankle inversion       Ankle eversion        (Blank rows = not tested)    FUNCTIONAL TESTS:  10/19/21  5 times sit to stand: 13.4 seconds and must use UE support 11/03/21 5TSTS test seconds 9.5 with UE support and 13.4 seconds without UE support   GAIT: 10/19/21: Distance walked: 100 Assistive device utilized: Environmental consultant - 2 wheeled Level of assistance: Modified independence Comments: slower velocity, decreased hip/knee flexion  11/03/21 Can ambulate without AD now but does use SPC at times in community        TODAY'S TREATMENT: 11/10/21 Therex:      Aerobic: Bike X 8 min L3     Machines: Leg press bil 100# 3x10; LLE only 50# 3X10 LAQ machine 10# up with both down with left. Then 5# X10 left leg only     Standing: Forward step ups onto 6" step (1 UE support) x15 Lateral step ups onto 6" step (2 UE support) x10 bilat Seated knee extension stretch heel prop in other chair 3 min X2  11/03/21 Therex:      Aerobic: NuStep L5 x 8 min     Machines: Leg press bil 100# 3x10; LLE only 37# 3X10     Standing: Forward step ups onto 6" step (1 UE support) x10 Lateral step ups onto 6" step (2 UE support) x10      Sitting: LAQ machine 5# 2X10 left leg only Sit to/from stand x10 reps without UE support Manual therapy  Lt knee PROM with emphasis on extension, extension mobs grade 2, and manual hamstring stretching   PATIENT EDUCATION:  Education details: HEP, PT plan of care Person educated: Patient Education method: Explanation, Demonstration, Verbal cues, and Handouts Education comprehension: verbalized understanding     HOME EXERCISE PROGRAM: Access Code: [email or web address] URL: [email or web address] Date: 10/19/2021 Prepared by: Elsie Ra   Exercises - Supine Heel Slide with Strap  - 2 x daily - 6 x weekly - 1-2 sets - 10 reps - 5  hold - Seated Knee Flexion Stretch  - 2 x daily - 6 x weekly - 1-2 sets - 10 reps - 5 sec hold - Seated Hamstring Stretch  - 2 x daily - 6 x weekly - 1 sets - 3 reps - 30 hold - Seated Straight Leg Heel Taps  - 2 x daily - 6 x weekly - 2-3 sets - 10 reps - Mini  Squat with Counter Support  - 2 x daily - 6 x weekly - 1-2 sets - 10 reps - Heel Prop  - 2 x daily - 6 x weekly - 1 sets - 1 reps - 5 min hold     ASSESSMENT:   CLINICAL IMPRESSION:  She has done excellent with PT, has met all goals and feels ready to discharge.     OBJECTIVE IMPAIRMENTS: decreased activity tolerance, difficulty walking, decreased balance, decreased endurance, decreased mobility, decreased ROM, decreased strength, impaired flexibility, impaired LE use, postural dysfunction, and pain.   ACTIVITY LIMITATIONS: bending, lifting, carry, locomotion, cleaning, community activity, driving   PERSONAL FACTORS: Lt TKA,osteopenia,anx,chronic LBP are also affecting patient's functional outcome.   REHAB POTENTIAL: Excellent   CLINICAL DECISION MAKING: Stable/uncomplicated   EVALUATION COMPLEXITY: Low       GOALS: Short term PT Goals Target date: 11/16/2021 Pt will be I and compliant with HEP. Baseline:  Goal status: MET 10/25/21 Pt will decrease pain by 50% overall Baseline: Goal status: MET   Long term PT goals Target date: 12/14/2021 Pt will improve Lt knee PROM 0-120 deg to improve functional mobility Baseline: Goal status: MET 6/22 Pt will improve  Lt hip/knee strength to at least 5-/5 MMT to improve functional strength Baseline: Goal status: MET 6/22 Pt will improve FOTO to at least 59% functional to show improved function Baseline: Goal status: MET 6/22 Pt will reduce pain to overall less than 2-3/10 with usual activity and work activity. Baseline: Goal status: ongoing 4 overall now 6/19 Pt will be able to improve 5 times sit to stand test to 12 seconds or less with UE support or less than 13 seconds  without UE support to show improved functional leg strength, endurance, and balance Goal status: MET 6/22   PLAN: PT FREQUENCY: 1-3 times per week    PT DURATION: 6-8 weeks   PLANNED INTERVENTIONS (unless contraindicated): aquatic PT, Canalith repositioning, cryotherapy, Electrical stimulation, Iontophoresis with 4 mg/ml dexamethasome, Moist heat, traction, Ultrasound, gait training, Therapeutic exercise, balance training, neuromuscular re-education, patient/family education, prosthetic training, manual techniques, passive ROM, dry needling, taping, vasopnuematic device, vestibular, spinal manipulations, joint manipulations   PLAN FOR NEXT SESSION: DC today    Elsie Ra, PT, DPT 11/10/21 10:56 AM

## 2021-11-11 ENCOUNTER — Ambulatory Visit (INDEPENDENT_AMBULATORY_CARE_PROVIDER_SITE_OTHER): Payer: Medicare Other | Admitting: Surgical

## 2021-11-11 DIAGNOSIS — Z96652 Presence of left artificial knee joint: Secondary | ICD-10-CM

## 2021-11-12 ENCOUNTER — Encounter: Payer: Self-pay | Admitting: Orthopedic Surgery

## 2021-11-12 NOTE — Progress Notes (Signed)
Post-Op Visit Note   Patient: Allison Cox           Date of Birth: [birth year]           MRN: [id number] Visit Date: 11/11/2021 PCP: [name], DO   Assessment & Plan:  Chief Complaint:  Chief Complaint  Patient presents with   Left Knee - Routine Post Op    left total knee arthroplasty on 09/29/2021   Visit Diagnoses:  1. S/P total knee arthroplasty, left     Plan: Patient is a 76 year old female who presents 6 weeks out from left total knee arthroplasty performed on 09/29/2021.  She is doing well and progressing without difficulty.  She finished physical therapy yesterday.  Ambulating with a cane.  No buckling of the knee.  No fevers, chills, night sweats, drainage from her incision.  No chest pain or shortness of breath.  She just notes "normal pain and stiffness".  Most of the pain bothers her at night.  She has reached 120 degrees in physical therapy.  On exam, she has 2 degrees extension and 105 degrees of knee flexion.  She has no calf tenderness.  Negative Homans' sign.  Able to perform straight leg raise without extensor lag.  Incision is healing well without any evidence of infection or dehiscence.  Trace effusion noted.  No significant pain with passive motion of the joint.  She ambulates with minimal antalgia.  There is no buckling of the knee with ambulation.  No gross mid flexion instability.  Plan is continue with home exercise program and follow-up with Dr. Marlou Sa for final check in 6 weeks.  Discussed dental antibiotic prophylaxis  Follow-Up Instructions: No follow-ups on file.   Orders:  No orders of the defined types were placed in this encounter.  No orders of the defined types were placed in this encounter.   Imaging: No results found.  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   LOW BACK PAIN, CHRONIC 08/11/2009   COLONIC POLYPS, HYPERPLASTIC, HX OF 08/17/2008   Acute upper respiratory infection 04/17/2008   SINUSITIS- ACUTE-NOS 03/30/2008   Hyperlipidemia 09/26/2007   Anxiety state 01/01/2007   CYST, SEBACEOUS 01/01/2007   OSTEOPENIA 01/01/2007   ARTHROSCOPY, RIGHT KNEE, HX OF 01/01/2007   Past Medical History:  Diagnosis Date   Anxiety    Arthritis    knee   Colon polyps    hyperplastic and Adenomatous   Hyperlipidemia    Osteopenia     Family History  Problem Relation Age of Onset   Coronary artery disease Other    Stroke Mother    Heart attack Father    Colon cancer Neg Hx    Rectal cancer Neg Hx    Stomach cancer Neg Hx     Past Surgical History:  Procedure Laterality Date   CARPAL TUNNEL RELEASE Left    COLONOSCOPY  07/2012   Henrene Pastor - polyps   KNEE ARTHROSCOPY Right    TOTAL KNEE ARTHROPLASTY Left  09/29/2021   Procedure: LEFT TOTAL KNEE ARTHROPLASTY;  Surgeon: Meredith Pel, MD;  Location: [facility];  Service: Orthopedics;  Laterality: Left;   WISDOM TOOTH EXTRACTION     Social History   Occupational History   Not on file  Tobacco Use   Smoking status: Former    Types: Cigarettes    Quit date: 05/15/1986    Years since quitting: 35.5   Smokeless tobacco: Never  Vaping Use   Vaping Use: Never used  Substance and Sexual Activity   Alcohol use: Yes    Alcohol/week: 3.0 - 4.0 standard drinks of alcohol    Types: 3 - 4 Glasses of wine per week   Drug use: No   Sexual activity: Yes    Partners: Male    Birth control/protection: Post-menopausal

## 2021-11-14 ENCOUNTER — Encounter: Payer: Medicare Other | Admitting: Physical Therapy

## 2021-11-16 ENCOUNTER — Encounter: Payer: Medicare Other | Admitting: Physical Therapy

## 2021-12-23 ENCOUNTER — Ambulatory Visit (INDEPENDENT_AMBULATORY_CARE_PROVIDER_SITE_OTHER): Payer: Medicare Other | Admitting: Orthopedic Surgery

## 2021-12-23 ENCOUNTER — Encounter: Payer: Self-pay | Admitting: Orthopedic Surgery

## 2021-12-23 DIAGNOSIS — Z96652 Presence of left artificial knee joint: Secondary | ICD-10-CM

## 2021-12-23 NOTE — Progress Notes (Signed)
Post-Op Visit Note   Patient: Allison Cox           Date of Birth: March 11, 1946           MRN: [id number] Visit Date: 12/23/2021 PCP: [name], DO   Assessment & Plan:  Chief Complaint:  Chief Complaint  Patient presents with   Left Knee - Follow-up    left total knee arthroplasty on 09/29/2021      Visit Diagnoses:  1. S/P total knee arthroplasty, left     Plan: Patient is a 76 year old female who presents s/p left total knee arthroplasty on 09/29/2021.  She states that she is doing very well.  She has been working on Dietitian and finished physical therapy at the end of June.  She still feels that she is making progress and pain is significantly improved compared with several months ago.  Only takes occasional ibuprofen for pain control.  She is back on the stationary bike after taking a break from exercising and feels that the bike is been very helpful.  Denies any fevers, chills, drainage from her incision.  Swelling has been improving.  On exam, patient has 0 degrees extension and 120 degrees of knee flexion.  No calf tenderness.  Negative Homans' sign.  Able to perform straight leg raise without extensor lag.  5/5 quad strength.  Incision is well-healed without evidence of infection or dehiscence and there is no sinus tract noted.  No effusion noted.  Plan is to have patient continue with home exercise program.  A lot of her residual pain is more lateral along the lateral femoral condyle and may be related to tightness of her IT band and some bursitis in this region.  Recommended she try IT band stretching which was demonstrated for her.  She will follow-up with the office as needed.  She is aware of the need for dental antibiotic prophylaxis.   From Dr. Marlou Sa: I agree with Luke's note.  sHe is doing very well with her knee replacement.  She has excellent range of motion and is improving strength in terms of getting up and down stairs.  She does not  put too much demand on the knees in terms of active athletic type activities.  She will follow-up with [place] as needed.  Continue with quad strengthening exercises. Follow-Up Instructions: No follow-ups on file.   Orders:  No orders of the defined types were placed in this encounter.  No orders of the defined types were placed in this encounter.   Imaging: No results found.  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 08/11/2009   LOW BACK PAIN, CHRONIC 08/11/2009   COLONIC POLYPS, HYPERPLASTIC, HX OF 08/17/2008   Acute upper respiratory infection 04/17/2008   SINUSITIS- ACUTE-NOS 03/30/2008   Hyperlipidemia 09/26/2007   Anxiety state 01/01/2007   CYST, SEBACEOUS 01/01/2007   OSTEOPENIA 01/01/2007   ARTHROSCOPY, RIGHT KNEE, HX OF 01/01/2007   Past Medical History:  Diagnosis Date   Anxiety  Arthritis    knee   Colon polyps    hyperplastic and Adenomatous   Hyperlipidemia    Osteopenia     Family History  Problem Relation Age of Onset   Coronary artery disease Other    Stroke Mother    Heart attack Father    Colon cancer Neg Hx    Rectal cancer Neg Hx    Stomach cancer Neg Hx     Past Surgical History:  Procedure Laterality Date   CARPAL TUNNEL RELEASE Left    COLONOSCOPY  07/2012   Henrene Pastor - polyps   KNEE ARTHROSCOPY Right    TOTAL KNEE ARTHROPLASTY Left 09/29/2021   Procedure: LEFT TOTAL KNEE ARTHROPLASTY;  Surgeon: Meredith Pel, MD;  Location: [facility];  Service:  Orthopedics;  Laterality: Left;   WISDOM TOOTH EXTRACTION     Social History   Occupational History   Not on file  Tobacco Use   Smoking status: Former    Types: Cigarettes    Quit date: 05/15/1986    Years since quitting: 35.6   Smokeless tobacco: Never  Vaping Use   Vaping Use: Never used  Substance and Sexual Activity   Alcohol use: Yes    Alcohol/week: 3.0 - 4.0 standard drinks of alcohol    Types: 3 - 4 Glasses of wine per week   Drug use: No   Sexual activity: Yes    Partners: Male    Birth control/protection: Post-menopausal

## 2022-01-04 ENCOUNTER — Other Ambulatory Visit: Payer: Self-pay | Admitting: Family Medicine

## 2022-01-04 DIAGNOSIS — Z0000 Encounter for general adult medical examination without abnormal findings: Secondary | ICD-10-CM

## 2022-01-04 DIAGNOSIS — F411 Generalized anxiety disorder: Secondary | ICD-10-CM

## 2022-01-04 NOTE — Telephone Encounter (Signed)
Requesting: ativan Contract: N/A UDS: N/A Last Visit:09/22/21 Next Visit: N/A Last Refill:10/11/21  Please Advise

## 2022-02-06 DIAGNOSIS — Z23 Encounter for immunization: Secondary | ICD-10-CM | POA: Diagnosis not present

## 2022-03-16 ENCOUNTER — Encounter: Payer: Self-pay | Admitting: Family Medicine

## 2022-03-16 ENCOUNTER — Ambulatory Visit: Payer: Medicare Other | Admitting: Family Medicine

## 2022-03-16 VITALS — BP 118/78 | HR 63 | Temp 98.5°F | Resp 18 | Ht 67.0 in | Wt 239.6 lb

## 2022-03-16 DIAGNOSIS — F411 Generalized anxiety disorder: Secondary | ICD-10-CM

## 2022-03-16 DIAGNOSIS — E559 Vitamin D deficiency, unspecified: Secondary | ICD-10-CM | POA: Diagnosis not present

## 2022-03-16 DIAGNOSIS — E782 Mixed hyperlipidemia: Secondary | ICD-10-CM | POA: Diagnosis not present

## 2022-03-16 LAB — CBC WITH DIFFERENTIAL/PLATELET
Basophils Absolute: 0 10*3/uL (ref 0.0–0.1)
Basophils Relative: 0.9 % (ref 0.0–3.0)
Eosinophils Absolute: 0.1 10*3/uL (ref 0.0–0.7)
Eosinophils Relative: 1.8 % (ref 0.0–5.0)
HCT: 37.8 % (ref 36.0–46.0)
Hemoglobin: 12.8 g/dL (ref 12.0–15.0)
Lymphocytes Relative: 12.5 % (ref 12.0–46.0)
Lymphs Abs: 0.6 10*3/uL — ABNORMAL LOW (ref 0.7–4.0)
MCHC: 33.7 g/dL (ref 30.0–36.0)
MCV: 86.9 fl (ref 78.0–100.0)
Monocytes Absolute: 0.4 10*3/uL (ref 0.1–1.0)
Monocytes Relative: 8.8 % (ref 3.0–12.0)
Neutro Abs: 3.7 10*3/uL (ref 1.4–7.7)
Neutrophils Relative %: 76 % (ref 43.0–77.0)
Platelets: 207 10*3/uL (ref 150.0–400.0)
RBC: 4.35 Mil/uL (ref 3.87–5.11)
RDW: 14.4 % (ref 11.5–15.5)
WBC: 4.9 10*3/uL (ref 4.0–10.5)

## 2022-03-16 LAB — COMPREHENSIVE METABOLIC PANEL
ALT: 13 U/L (ref 0–35)
AST: 13 U/L (ref 0–37)
Albumin: 4.4 g/dL (ref 3.5–5.2)
Alkaline Phosphatase: 68 U/L (ref 39–117)
BUN: 14 mg/dL (ref 6–23)
CO2: 31 mEq/L (ref 19–32)
Calcium: 9.3 mg/dL (ref 8.4–10.5)
Chloride: 101 mEq/L (ref 96–112)
Creatinine, Ser: 0.72 mg/dL (ref 0.40–1.20)
GFR: 82.03 mL/min (ref >60.00)
Glucose, Bld: 95 mg/dL (ref 70–99)
Potassium: 4.4 mEq/L (ref 3.5–5.1)
Sodium: 139 mEq/L (ref 135–145)
Total Bilirubin: 0.5 mg/dL (ref 0.2–1.2)
Total Protein: 6.9 g/dL (ref 6.0–8.3)

## 2022-03-16 LAB — LIPID PANEL
Cholesterol: 184 mg/dL (ref 0–200)
HDL: 66.3 mg/dL (ref >39.00)
LDL Cholesterol: 96 mg/dL (ref 0–99)
NonHDL: 117.43
Total CHOL/HDL Ratio: 3
Triglycerides: 107 mg/dL (ref 0.0–149.0)
VLDL: 21.4 mg/dL (ref 0.0–40.0)

## 2022-03-16 MED ORDER — ATORVASTATIN CALCIUM 20 MG PO TABS: 20.0000 mg | ORAL_TABLET | Freq: Every day | ORAL | 1 refills | Status: DC

## 2022-03-16 MED ORDER — SERTRALINE HCL 100 MG PO TABS: ORAL_TABLET | ORAL | 3 refills | Status: DC

## 2022-03-16 MED ORDER — VITAMIN D (ERGOCALCIFEROL) 1.25 MG (50000 UNIT) PO CAPS: 50000.0000 [IU] | ORAL_CAPSULE | ORAL | 1 refills | Status: DC

## 2022-03-16 MED ORDER — LORAZEPAM 0.5 MG PO TABS: 0.5000 mg | ORAL_TABLET | Freq: Three times a day (TID) | ORAL | 1 refills | Status: DC | PRN

## 2022-03-16 NOTE — Assessment & Plan Note (Signed)
Tolerating statin, encouraged heart healthy diet, avoid trans fats, minimize simple carbs and saturated fats. Increase exercise as tolerated 

## 2022-03-16 NOTE — Patient Instructions (Signed)
Cholesterol Content in Foods ?Cholesterol is a waxy, fat-like substance that helps to carry fat in the blood. The body needs cholesterol in small amounts, but too much cholesterol can cause damage to the arteries and heart. ?What foods have cholesterol? ? ?Cholesterol is found in animal-based foods, such as meat, seafood, and dairy. Generally, low-fat dairy and lean meats have less cholesterol than full-fat dairy and fatty meats. The milligrams of cholesterol per serving (mg per serving) of common cholesterol-containing foods are listed below. ?Meats and other proteins ?Egg -- one large whole egg has 186 mg. ?Veal shank -- 4 oz (113 g) has 141 mg. ?Lean ground turkey (93% lean) -- 4 oz (113 g) has 118 mg. ?Fat-trimmed lamb loin -- 4 oz (113 g) has 106 mg. ?Lean ground beef (90% lean) -- 4 oz (113 g) has 100 mg. ?Lobster -- 3.5 oz (99 g) has 90 mg. ?Pork loin chops -- 4 oz (113 g) has 86 mg. ?Canned salmon -- 3.5 oz (99 g) has 83 mg. ?Fat-trimmed beef top loin -- 4 oz (113 g) has 78 mg. ?Frankfurter -- 1 frank (3.5 oz or 99 g) has 77 mg. ?Crab -- 3.5 oz (99 g) has 71 mg. ?Roasted chicken without skin, white meat -- 4 oz (113 g) has 66 mg. ?Light bologna -- 2 oz (57 g) has 45 mg. ?Deli-cut turkey -- 2 oz (57 g) has 31 mg. ?Canned tuna -- 3.5 oz (99 g) has 31 mg. ?Bacon -- 1 oz (28 g) has 29 mg. ?Oysters and mussels (raw) -- 3.5 oz (99 g) has 25 mg. ?Mackerel -- 1 oz (28 g) has 22 mg. ?Trout -- 1 oz (28 g) has 20 mg. ?Pork sausage -- 1 link (1 oz or 28 g) has 17 mg. ?Salmon -- 1 oz (28 g) has 16 mg. ?Tilapia -- 1 oz (28 g) has 14 mg. ?Dairy ?Soft-serve ice cream -- ? cup (4 oz or 86 g) has 103 mg. ?Whole-milk yogurt -- 1 cup (8 oz or 245 g) has 29 mg. ?Cheddar cheese -- 1 oz (28 g) has 28 mg. ?American cheese -- 1 oz (28 g) has 28 mg. ?Whole milk -- 1 cup (8 oz or 250 mL) has 23 mg. ?2% milk -- 1 cup (8 oz or 250 mL) has 18 mg. ?Cream cheese -- 1 tablespoon (Tbsp) (14.5 g) has 15 mg. ?Cottage cheese -- ? cup (4 oz or  113 g) has 14 mg. ?Low-fat (1%) milk -- 1 cup (8 oz or 250 mL) has 10 mg. ?Sour cream -- 1 Tbsp (12 g) has 8.5 mg. ?Low-fat yogurt -- 1 cup (8 oz or 245 g) has 8 mg. ?Nonfat Greek yogurt -- 1 cup (8 oz or 228 g) has 7 mg. ?Half-and-half cream -- 1 Tbsp (15 mL) has 5 mg. ?Fats and oils ?Cod liver oil -- 1 tablespoon (Tbsp) (13.6 g) has 82 mg. ?Butter -- 1 Tbsp (14 g) has 15 mg. ?Lard -- 1 Tbsp (12.8 g) has 14 mg. ?Bacon grease -- 1 Tbsp (12.9 g) has 14 mg. ?Mayonnaise -- 1 Tbsp (13.8 g) has 5-10 mg. ?Margarine -- 1 Tbsp (14 g) has 3-10 mg. ?The items listed above may not be a complete list of foods with cholesterol. Exact amounts of cholesterol in these foods may vary depending on specific ingredients and brands. Contact a dietitian for more information. ?What foods do not have cholesterol? ?Most plant-based foods do not have cholesterol unless you combine them with a food that has   cholesterol. Foods without cholesterol include: ?Grains and cereals. ?Vegetables. ?Fruits. ?Vegetable oils, such as olive, canola, and sunflower oil. ?Legumes, such as peas, beans, and lentils. ?Nuts and seeds. ?Egg whites. ?The items listed above may not be a complete list of foods that do not have cholesterol. Contact a dietitian for more information. ?Summary ?The body needs cholesterol in small amounts, but too much cholesterol can cause damage to the arteries and heart. ?Cholesterol is found in animal-based foods, such as meat, seafood, and dairy. Generally, low-fat dairy and lean meats have less cholesterol than full-fat dairy and fatty meats. ?This information is not intended to replace advice given to you by your health care provider. Make sure you discuss any questions you have with your health care provider. ?Document Revised: 09/10/2020 Document Reviewed: 09/10/2020 ?Elsevier Patient Education ? 2023 Elsevier Inc. ? ?

## 2022-03-16 NOTE — Progress Notes (Signed)
Subjective:   By signing my name below, I, Allison Cox, attest that this documentation has been prepared under the direction and in the presence of Allison Held, DO. 03/16/2022    Patient ID: Allison Cox, female    DOB: [birth year], 76 y.o.   MRN: [id number]  Chief Complaint  Patient presents with   Hyperlipidemia   Follow-up    Hyperlipidemia Pertinent negatives include no chest pain, myalgias or shortness of breath.   Patient is in today for a follow up visit.   She is requesting a refill for 20 mg atorvastatin, 0.5 mg Lorazepam, 100 mg sertraline, 1.25 mg Drisdol.  She is recovering well from her recent knee replacement procedure.  She reports receiving the latest moderna Covid-19 booster vaccine. She does not remember if she received both shingrix vaccines.    Past Medical History:  Diagnosis Date   Anxiety    Arthritis    knee   Colon polyps    hyperplastic and Adenomatous   Hyperlipidemia    Osteopenia     Past Surgical History:  Procedure Laterality Date   CARPAL TUNNEL RELEASE Left    COLONOSCOPY  07/2012   Henrene Pastor - polyps   KNEE ARTHROSCOPY Right    TOTAL KNEE ARTHROPLASTY Left 09/29/2021   Procedure: LEFT TOTAL KNEE ARTHROPLASTY;  Surgeon: Meredith Pel, MD;  Location: [facility];  Service: Orthopedics;  Laterality: Left;   WISDOM TOOTH EXTRACTION      Family History  Problem Relation Age of Onset   Coronary artery disease Other    Stroke Mother    Heart attack Father    Colon cancer Neg Hx    Rectal cancer Neg Hx    Stomach cancer Neg Hx     Social History   Socioeconomic History   Marital status: Single    Spouse name: Not on file   Number of children: 0   Years of education: Not on file   Highest education level: Not on file  Occupational History   Not on file  Tobacco Use   Smoking status: Former    Types: Cigarettes    Quit date: 05/15/1986    Years since quitting: 35.8   Smokeless tobacco: Never  Vaping Use    Vaping Use: Never used  Substance and Sexual Activity   Alcohol use: Yes    Alcohol/week: 3.0 - 4.0 standard drinks of alcohol    Types: 3 - 4 Glasses of wine per week   Drug use: No   Sexual activity: Yes    Partners: Male    Birth control/protection: Post-menopausal  Other Topics Concern   Not on file  Social History Narrative   Not on file   Social Determinants of Health   Financial Resource Strain: Not on file  Food Insecurity: Not on file  Transportation Needs: Not on file  Physical Activity: Not on file  Stress: Not on file  Social Connections: Not on file  Intimate Partner Violence: Not on file    Outpatient Medications Prior to Visit  Medication Sig Dispense Refill   atorvastatin (LIPITOR) 20 MG tablet Take 1 tablet (20 mg total) by mouth daily. 90 tablet 1   LORazepam (ATIVAN) 0.5 MG tablet TAKE 1 TABLET BY MOUTH EVERY 8 HOURS AS NEEDED FOR ANXIETY 90 tablet 0   sertraline (ZOLOFT) 100 MG tablet TAKE 1 & 1/2 (ONE & ONE-HALF) TABLETS BY MOUTH ONCE DAILY 135 tablet 1   Vitamin D, Ergocalciferol, (DRISDOL) 1.25 MG (  50000 UNIT) CAPS capsule Take 1 capsule (50,000 Units total) by mouth every 7 (seven) days. 12 capsule 1   acetaminophen (TYLENOL) 325 MG tablet Take 1-2 tablets (325-650 mg total) by mouth every 6 (six) hours as needed for mild pain (pain score 1-3 or temp > 100.5). (Patient not taking: Reported on 03/16/2022) 45 tablet 0   aspirin 81 MG chewable tablet Chew 1 tablet (81 mg total) by mouth daily. (Patient not taking: Reported on 03/16/2022) 30 tablet 0   celecoxib (CELEBREX) 100 MG capsule Take 1 capsule (100 mg total) by mouth 2 (two) times daily. (Patient not taking: Reported on 03/16/2022) 30 capsule 0   docusate sodium (COLACE) 100 MG capsule Take 1 capsule (100 mg total) by mouth 2 (two) times daily. (Patient not taking: Reported on 03/16/2022) 10 capsule 0   methocarbamol (ROBAXIN) 500 MG tablet Take 1 tablet (500 mg total) by mouth every 6 (six) hours as needed  for muscle spasms. (Patient not taking: Reported on 03/16/2022) 30 tablet 0   No facility-administered medications prior to visit.    Allergies  Allergen Reactions   Codeine Nausea And Vomiting    SEVERE NAUSEA AND VOMITING-- do not give at all    Review of Systems  Constitutional:  Negative for chills, fever and malaise/fatigue.  HENT:  Negative for congestion and hearing loss.   Eyes:  Negative for discharge.  Respiratory:  Negative for cough, sputum production and shortness of breath.   Cardiovascular:  Negative for chest pain, palpitations and leg swelling.  Gastrointestinal:  Negative for abdominal pain, blood in stool, constipation, diarrhea, heartburn, nausea and vomiting.  Genitourinary:  Negative for dysuria, frequency, hematuria and urgency.  Musculoskeletal:  Negative for back pain, falls and myalgias.  Skin:  Negative for rash.  Neurological:  Negative for dizziness, sensory change, loss of consciousness, weakness and headaches.  Endo/Heme/Allergies:  Negative for environmental allergies. Does not bruise/bleed easily.  Psychiatric/Behavioral:  Negative for depression and suicidal ideas. The patient is not nervous/anxious and does not have insomnia.        Objective:    Physical Exam Vitals and nursing note reviewed.  Constitutional:      General: She is not in acute distress.    Appearance: Normal appearance. She is not ill-appearing.  HENT:     Head: Normocephalic and atraumatic.     Right Ear: External ear normal.     Left Ear: External ear normal.  Eyes:     Extraocular Movements: Extraocular movements intact.     Pupils: Pupils are equal, round, and reactive to light.  Cardiovascular:     Rate and Rhythm: Normal rate and regular rhythm.     Heart sounds: Normal heart sounds. No murmur heard.    No gallop.  Pulmonary:     Effort: Pulmonary effort is normal. No respiratory distress.     Breath sounds: Normal breath sounds. No wheezing or rales.  Skin:     General: Skin is warm and dry.  Neurological:     Mental Status: She is alert and oriented to person, place, and time.  Psychiatric:        Judgment: Judgment normal.     BP 118/78 (BP Location: Left Arm, Patient Position: Sitting, Cuff Size: Large)   Pulse 63   Temp 98.5 F (36.9 C) (Oral)   Resp 18   Ht '5\' 7"'$  (1.702 m)   Wt 239 lb 9.6 oz (108.7 kg)   SpO2 97%   BMI 37.53 kg/m  Wt Readings from Last 3 Encounters:  03/16/22 239 lb 9.6 oz (108.7 kg)  09/29/21 240 lb (108.9 kg)  09/22/21 243 lb 6.4 oz (110.4 kg)    Diabetic Foot Exam - Simple   No data filed    Lab Results  Component Value Date   WBC 8.1 10/01/2021   HGB 12.0 10/01/2021   HCT 35.1 (L) 10/01/2021   PLT 191 10/01/2021   GLUCOSE 110 (H) 09/22/2021   CHOL 203 (H) 09/22/2021   TRIG 101.0 09/22/2021   HDL 73.00 09/22/2021   LDLDIRECT 125.1 10/06/2011   LDLCALC 109 (H) 09/22/2021   ALT 15 09/22/2021   AST 14 09/22/2021   NA 138 09/22/2021   K 4.6 09/22/2021   CL 101 09/22/2021   CREATININE 0.72 09/22/2021   BUN 14 09/22/2021   CO2 31 09/22/2021   TSH 3.62 07/05/2021    Lab Results  Component Value Date   TSH 3.62 07/05/2021   Lab Results  Component Value Date   WBC 8.1 10/01/2021   HGB 12.0 10/01/2021   HCT 35.1 (L) 10/01/2021   MCV 88.2 10/01/2021   PLT 191 10/01/2021   Lab Results  Component Value Date   NA 138 09/22/2021   K 4.6 09/22/2021   CO2 31 09/22/2021   GLUCOSE 110 (H) 09/22/2021   BUN 14 09/22/2021   CREATININE 0.72 09/22/2021   BILITOT 0.3 09/22/2021   ALKPHOS 68 09/22/2021   AST 14 09/22/2021   ALT 15 09/22/2021   PROT 7.4 09/22/2021   ALBUMIN 4.7 09/22/2021   CALCIUM 9.5 09/22/2021   ANIONGAP 5 09/21/2021   GFR 82.30 09/22/2021   Lab Results  Component Value Date   CHOL 203 (H) 09/22/2021   Lab Results  Component Value Date   HDL 73.00 09/22/2021   Lab Results  Component Value Date   LDLCALC 109 (H) 09/22/2021   Lab Results  Component Value Date    TRIG 101.0 09/22/2021   Lab Results  Component Value Date   CHOLHDL 3 09/22/2021   No results found for: "HGBA1C"     Assessment & Plan:   Problem List Items Addressed This Visit       Unprioritized   Vitamin D deficiency   Relevant Medications   Vitamin D, Ergocalciferol, (DRISDOL) 1.25 MG (50000 UNIT) CAPS capsule   Hyperlipidemia - Primary    Tolerating statin, encouraged heart healthy diet, avoid trans fats, minimize simple carbs and saturated fats. Increase exercise as tolerated       Relevant Medications   atorvastatin (LIPITOR) 20 MG tablet   Other Relevant Orders   CBC with Differential/Platelet   Comprehensive metabolic panel   Lipid panel   Generalized anxiety disorder   Relevant Medications   LORazepam (ATIVAN) 0.5 MG tablet   sertraline (ZOLOFT) 100 MG tablet     Meds ordered this encounter  Medications   atorvastatin (LIPITOR) 20 MG tablet    Sig: Take 1 tablet (20 mg total) by mouth daily.    Dispense:  90 tablet    Refill:  1   LORazepam (ATIVAN) 0.5 MG tablet    Sig: Take 1 tablet (0.5 mg total) by mouth every 8 (eight) hours as needed. for anxiety    Dispense:  90 tablet    Refill:  1   sertraline (ZOLOFT) 100 MG tablet    Sig: 1 1/2 po qd    Dispense:  135 tablet    Refill:  3   Vitamin D, Ergocalciferol, (DRISDOL)  1.25 MG (50000 UNIT) CAPS capsule    Sig: Take 1 capsule (50,000 Units total) by mouth every 7 (seven) days.    Dispense:  12 capsule    Refill:  1    I, Allison Held, DO, personally preformed the services described in this documentation.  All medical record entries made by the scribe were at my direction and in my presence.  I have reviewed the chart and discharge instructions (if applicable) and agree that the record reflects my personal performance and is accurate and complete. 03/16/2022   I,Allison Cox,acting as a scribe for Allison Held, DO.,have documented all relevant documentation on the behalf of Allison Held, DO,as directed by  Allison Held, DO while in the presence of Allison Held, DO.   Allison Held, DO

## 2022-08-02 DIAGNOSIS — K080 Exfoliation of teeth due to systemic causes: Secondary | ICD-10-CM | POA: Diagnosis not present

## 2022-09-03 ENCOUNTER — Other Ambulatory Visit: Payer: Self-pay | Admitting: Family Medicine

## 2022-09-03 DIAGNOSIS — E782 Mixed hyperlipidemia: Secondary | ICD-10-CM

## 2022-09-06 DIAGNOSIS — K080 Exfoliation of teeth due to systemic causes: Secondary | ICD-10-CM | POA: Diagnosis not present

## 2022-09-07 ENCOUNTER — Ambulatory Visit (HOSPITAL_BASED_OUTPATIENT_CLINIC_OR_DEPARTMENT_OTHER)
Admission: RE | Admit: 2022-09-07 | Discharge: 2022-09-07 | Disposition: A | Discharge status: Home / Self Care | Payer: Medicare Other | Source: Ambulatory Visit | Attending: Family Medicine | Admitting: Family Medicine

## 2022-09-07 ENCOUNTER — Encounter: Payer: Self-pay | Admitting: Family Medicine

## 2022-09-07 ENCOUNTER — Ambulatory Visit (INDEPENDENT_AMBULATORY_CARE_PROVIDER_SITE_OTHER): Payer: Medicare Other | Admitting: Family Medicine

## 2022-09-07 VITALS — BP 130/82 | HR 68 | Temp 98.4°F | Resp 18 | Ht 67.0 in | Wt 244.8 lb

## 2022-09-07 DIAGNOSIS — R202 Paresthesia of skin: Secondary | ICD-10-CM | POA: Diagnosis not present

## 2022-09-07 DIAGNOSIS — S161XXA Strain of muscle, fascia and tendon at neck level, initial encounter: Secondary | ICD-10-CM

## 2022-09-07 DIAGNOSIS — E782 Mixed hyperlipidemia: Secondary | ICD-10-CM

## 2022-09-07 DIAGNOSIS — Z79899 Other long term (current) drug therapy: Secondary | ICD-10-CM

## 2022-09-07 DIAGNOSIS — E559 Vitamin D deficiency, unspecified: Secondary | ICD-10-CM

## 2022-09-07 DIAGNOSIS — R200 Anesthesia of skin: Secondary | ICD-10-CM | POA: Diagnosis not present

## 2022-09-07 DIAGNOSIS — I10 Essential (primary) hypertension: Secondary | ICD-10-CM

## 2022-09-07 DIAGNOSIS — F411 Generalized anxiety disorder: Secondary | ICD-10-CM

## 2022-09-07 LAB — COMPREHENSIVE METABOLIC PANEL
ALT: 16 U/L (ref 0–35)
AST: 16 U/L (ref 0–37)
Albumin: 4.2 g/dL (ref 3.5–5.2)
Alkaline Phosphatase: 61 U/L (ref 39–117)
BUN: 16 mg/dL (ref 6–23)
CO2: 30 mEq/L (ref 19–32)
Calcium: 9.3 mg/dL (ref 8.4–10.5)
Chloride: 101 mEq/L (ref 96–112)
Creatinine, Ser: 0.77 mg/dL (ref 0.40–1.20)
GFR: 75.42 mL/min (ref >60.00)
Glucose, Bld: 96 mg/dL (ref 70–99)
Potassium: 4.7 mEq/L (ref 3.5–5.1)
Sodium: 139 mEq/L (ref 135–145)
Total Bilirubin: 0.5 mg/dL (ref 0.2–1.2)
Total Protein: 6.8 g/dL (ref 6.0–8.3)

## 2022-09-07 LAB — LIPID PANEL
Cholesterol: 188 mg/dL (ref 0–200)
HDL: 73.4 mg/dL (ref >39.00)
LDL Cholesterol: 94 mg/dL (ref 0–99)
NonHDL: 114.78
Total CHOL/HDL Ratio: 3
Triglycerides: 103 mg/dL (ref 0.0–149.0)
VLDL: 20.6 mg/dL (ref 0.0–40.0)

## 2022-09-07 LAB — VITAMIN D 25 HYDROXY (VIT D DEFICIENCY, FRACTURES): VITD: 35.74 ng/mL (ref 30.00–100.00)

## 2022-09-07 MED ORDER — ATORVASTATIN CALCIUM 20 MG PO TABS: 20.0000 mg | ORAL_TABLET | Freq: Every day | ORAL | 0 refills | Status: DC

## 2022-09-07 MED ORDER — CYCLOBENZAPRINE HCL 10 MG PO TABS: 10.0000 mg | ORAL_TABLET | Freq: Three times a day (TID) | ORAL | 0 refills | Status: DC | PRN

## 2022-09-07 MED ORDER — LORAZEPAM 0.5 MG PO TABS: 0.5000 mg | ORAL_TABLET | Freq: Three times a day (TID) | ORAL | 1 refills | Status: DC | PRN

## 2022-09-07 MED ORDER — PREDNISONE 10 MG PO TABS: ORAL_TABLET | ORAL | 0 refills | Status: DC

## 2022-09-07 MED ORDER — SERTRALINE HCL 100 MG PO TABS: ORAL_TABLET | ORAL | 3 refills | Status: DC

## 2022-09-07 MED ORDER — VITAMIN D (ERGOCALCIFEROL) 1.25 MG (50000 UNIT) PO CAPS: 50000.0000 [IU] | ORAL_CAPSULE | ORAL | 1 refills | Status: DC

## 2022-09-07 NOTE — Assessment & Plan Note (Signed)
Stable

## 2022-09-07 NOTE — Progress Notes (Addendum)
Subjective:   By signing my name below, I, Allison Cox, attest that this documentation has been prepared under the direction and in the presence of Allison Schultz, DO. 09/07/2022   Patient ID: Allison Cox, female    DOB: December 06, 1945, 77 y.o.   MRN: [id number]  Chief Complaint  Patient presents with   Hyperlipidemia   Follow-up   Shoulder Pain    Right shoulder, x2-3 months, pt has been seeing chiro and he believes she has a pinched nerve    Hyperlipidemia Pertinent negatives include no chest pain or shortness of breath.  Shoulder Pain  Pertinent negatives include no fever.   Patient is in today for a follow up visit.   She complains of right shoulder pain. She is seeing a chiropractor to help manage her pain. Her chiropractor thinks a pinched nerve may be causing her pain. He did not do X-rays. Her pain has improved while under him.  She is UTD on the latest flu and Covid-19 vaccine.   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   Procedure: LEFT TOTAL KNEE ARTHROPLASTY;  Surgeon: Allison Copa, MD;  Location: [facility] OR;  Service: Orthopedics;  Laterality: Left;   WISDOM TOOTH EXTRACTION      Family History  Problem Relation Age of Onset   Coronary artery disease Other    Stroke Mother    Heart attack Father    Colon cancer Neg Hx    Rectal cancer Neg Hx    Stomach cancer Neg Hx     Social History   Socioeconomic History   Marital status: Single    Spouse name: Not on file   Number of children: 0   Years of education: Not on file   Highest education level: Not on file  Occupational History   Not on file  Tobacco Use   Smoking status: Former     Types: Cigarettes    Quit date: 05/15/1986    Years since quitting: 36.3   Smokeless tobacco: Never  Vaping Use   Vaping Use: Never used  Substance and Sexual Activity   Alcohol use: Yes    Alcohol/week: 3.0 - 4.0 standard drinks of alcohol    Types: 3 - 4 Glasses of wine per week   Drug use: No   Sexual activity: Yes    Partners: Male    Birth control/protection: Post-menopausal  Other Topics Concern   Not on file  Social History Narrative   Not on file   Social Determinants of Health   Financial Resource Strain: Not on file  Food Insecurity: Not on file  Transportation Needs: Not on file  Physical Activity: Not on file  Stress: Not on file  Social Connections: Not on file  Intimate Partner Violence: Not on file    Outpatient Medications Prior to Visit  Medication Sig Dispense Refill   atorvastatin (LIPITOR) 20 MG tablet Take 1 tablet by mouth once daily 90 tablet 0   LORazepam (ATIVAN) 0.5 MG tablet Take 1 tablet (0.5 mg total) by mouth every 8 (eight) hours as needed. for anxiety  90 tablet 1   sertraline (ZOLOFT) 100 MG tablet 1 1/2 po qd 135 tablet 3   Vitamin D, Ergocalciferol, (DRISDOL) 1.25 MG (50000 UNIT) CAPS capsule Take 1 capsule (50,000 Units total) by mouth every 7 (seven) days. 12 capsule 1   No facility-administered medications prior to visit.    Allergies  Allergen Reactions   Codeine Nausea And Vomiting    SEVERE NAUSEA AND VOMITING-- do not give at all    Review of Systems  Constitutional:  Negative for fever and malaise/fatigue.  HENT:  Negative for congestion.   Eyes:  Negative for blurred vision.  Respiratory:  Negative for shortness of breath.   Cardiovascular:  Negative for chest pain, palpitations and leg swelling.  Gastrointestinal:  Negative for abdominal pain, blood in stool and nausea.  Genitourinary:  Negative for dysuria and frequency.  Musculoskeletal:  Negative for falls.       (+)right shoulder pain  Skin:  Negative for rash.   Neurological:  Negative for dizziness, loss of consciousness and headaches.  Endo/Heme/Allergies:  Negative for environmental allergies.  Psychiatric/Behavioral:  Negative for depression. The patient is not nervous/anxious.        Objective:    Physical Exam Vitals and nursing note reviewed.  Constitutional:      General: She is not in acute distress.    Appearance: Normal appearance. She is not ill-appearing.  HENT:     Head: Normocephalic and atraumatic.     Right Ear: External ear normal.     Left Ear: External ear normal.  Eyes:     Extraocular Movements: Extraocular movements intact.     Pupils: Pupils are equal, round, and reactive to light.  Cardiovascular:     Rate and Rhythm: Normal rate and regular rhythm.     Heart sounds: Normal heart sounds. No murmur heard.    No gallop.  Pulmonary:     Effort: Pulmonary effort is normal. No respiratory distress.     Breath sounds: Normal breath sounds. No wheezing or rales.  Musculoskeletal:        General: Normal range of motion.  Skin:    General: Skin is warm and dry.  Neurological:     General: No focal deficit present.     Mental Status: She is alert and oriented to person, place, and time.  Psychiatric:        Mood and Affect: Mood normal.        Judgment: Judgment normal.     BP 130/82 (BP Location: Left Arm, Patient Position: Sitting, Cuff Size: Large)   Pulse 68   Temp 98.4 F (36.9 C) (Oral)   Resp 18   Ht  (1.702 m)   Wt 244 lb 12.8 oz (111 kg)   SpO2 97%   BMI 38.34 kg/m  Wt Readings from Last 3 Encounters:  09/07/22 244 lb 12.8 oz (111 kg)  03/16/22 239 lb 9.6 oz (108.7 kg)  09/29/21 240 lb (108.9 kg)       Assessment & Plan:  High risk medication use -     Drug Monitoring Panel [phone] , Urine  Mixed hyperlipidemia Assessment & Plan: Encourage heart healthy diet such as MIND or DASH diet, increase exercise, avoid trans fats, simple carbohydrates and processed foods, consider a krill or  fish or flaxseed oil cap daily.    Orders: -     Atorvastatin Calcium; Take 1 tablet (20 mg total) by mouth daily.  Dispense: 90 tablet; Refill: 0 -  CBC+Platelet+Hem Review -     Comprehensive metabolic panel -     Lipid panel  Generalized anxiety disorder -     LORazepam; Take 1 tablet (0.5 mg total) by mouth every 8 (eight) hours as needed. for anxiety  Dispense: 90 tablet; Refill: 1 -     Sertraline HCl; 1 1/2 po qd  Dispense: 135 tablet; Refill: 3  Vitamin D deficiency -     Vitamin D (Ergocalciferol); Take 1 capsule (50,000 Units total) by mouth every 7 (seven) days.  Dispense: 12 capsule; Refill: 1 -     VITAMIN D 25 Hydroxy (Vit-D Deficiency, Fractures)  Strain of neck muscle, initial encounter Assessment & Plan: Muscle relaxer and pred taper  ? Pinched nerve in neck She will see ortho  Orders: -     Cyclobenzaprine HCl; Take 1 tablet (10 mg total) by mouth 3 (three) times daily as needed for muscle spasms.  Dispense: 30 tablet; Refill: 0 -     predniSONE; TAKE 3 TABLETS PO QD FOR 3 DAYS THEN TAKE 2 TABLETS PO QD FOR 3 DAYS THEN TAKE 1 TABLET PO QD FOR 3 DAYS THEN TAKE 1/2 TAB PO QD FOR 3 DAYS  Dispense: 20 tablet; Refill: 0 -     DG Cervical Spine Complete; Future  Anxiety state Assessment & Plan: Stable    Primary hypertension Assessment & Plan: Well controlled, no changes to meds. Encouraged heart healthy diet such as the DASH diet and exercise as tolerated.       IDonato Schultz, DO, personally preformed the services described in this documentation.  All medical record entries made by the scribe were at my direction and in my presence.  I have reviewed the chart and discharge instructions (if applicable) and agree that the record reflects my personal performance and is accurate and complete. 09/07/2022   I,Allison Cox,acting as a scribe for Allison Schultz, DO.,have documented all relevant documentation on the behalf of Allison Schultz,  DO,as directed by  Allison Schultz, DO while in the presence of Allison Schultz, DO.   Allison Schultz, DO

## 2022-09-07 NOTE — Assessment & Plan Note (Signed)
Encourage heart healthy diet such as MIND or DASH diet, increase exercise, avoid trans fats, simple carbohydrates and processed foods, consider a krill or fish or flaxseed oil cap daily.  °

## 2022-09-07 NOTE — Patient Instructions (Signed)
Shoulder Pain Many things can cause shoulder pain, including: An injury to the shoulder. Overuse of the shoulder. Arthritis. The source of the pain can be: Inflammation. An injury to the shoulder joint. An injury to a tendon, ligament, or bone. Follow these instructions at home: Pay attention to changes in your symptoms. Let your health care provider know about them. Follow these instructions to relieve your pain. If you have a removable sling: Wear the sling as told by your provider. Remove it only as told by your provider. Check the skin around the sling every day. Tell your provider about any concerns. Loosen the sling if your fingers tingle, become numb, or become cold. Keep the sling clean. If the sling is not waterproof: Do not let it get wet. Remove it to shower or bathe. Move your arm as little as possible, but keep your hand moving to prevent swelling. Managing pain, stiffness, and swelling  If told, put ice on the painful area. If you have a removable sling or immobilizer, remove it as told by your provider. Put ice in a plastic bag. Place a towel between your skin and the bag. Leave the ice on for 20 minutes, 2-3 times a day. If your skin turns bright red, remove the ice right away to prevent skin damage. The risk of damage is higher if you cannot feel pain, heat, or cold. Move your fingers often to reduce stiffness and swelling. Squeeze a soft ball or a foam pad as much as possible. This helps to keep the shoulder from swelling. It also helps to strengthen the arm. General instructions Take over-the-counter and prescription medicines only as told by your provider. Exercise may help with pain management. Perform exercises if told by your provider. You may be referred to a physical therapist to help in your recovery process. Keep all follow-up visits in order to avoid any type of permanent shoulder disability or chronic pain problems. Contact a health care provider  if: Your pain is not relieved with medicines. New pain develops in your arm, hand, or fingers. You loosen your sling and your arm, hand, or fingers remain tingly, numb, swollen, or painful. Get help right away if: Your arm, hand, or fingers turn white or blue. This information is not intended to replace advice given to you by your health care provider. Make sure you discuss any questions you have with your health care provider. Document Revised: 12/02/2021 Document Reviewed: 12/02/2021 Elsevier Patient Education  2023 Elsevier Inc.  

## 2022-09-08 DIAGNOSIS — K080 Exfoliation of teeth due to systemic causes: Secondary | ICD-10-CM | POA: Diagnosis not present

## 2022-09-08 NOTE — Assessment & Plan Note (Signed)
Well controlled, no changes to meds. Encouraged heart healthy diet such as the DASH diet and exercise as tolerated.  °

## 2022-09-08 NOTE — Assessment & Plan Note (Signed)
Muscle relaxer and pred taper  ? Pinched nerve in neck She will see ortho

## 2022-09-10 LAB — DRUG MONITORING PANEL 376104, URINE
Alphahydroxyalprazolam: NEGATIVE ng/mL (ref <25)
Alphahydroxymidazolam: NEGATIVE ng/mL (ref <50)
Alphahydroxytriazolam: NEGATIVE ng/mL (ref <50)
Aminoclonazepam: NEGATIVE ng/mL (ref <25)
Amphetamines: NEGATIVE ng/mL (ref <500)
Barbiturates: NEGATIVE ng/mL (ref <300)
Benzodiazepines: POSITIVE ng/mL — AB (ref <100)
Cocaine Metabolite: NEGATIVE ng/mL (ref <150)
Desmethyltramadol: NEGATIVE ng/mL (ref <100)
Hydroxyethylflurazepam: NEGATIVE ng/mL (ref <50)
Lorazepam: 550 ng/mL — ABNORMAL HIGH (ref <50)
Nordiazepam: NEGATIVE ng/mL (ref <50)
Opiates: NEGATIVE ng/mL (ref <100)
Oxazepam: NEGATIVE ng/mL (ref <50)
Oxycodone: NEGATIVE ng/mL (ref <100)
Temazepam: NEGATIVE ng/mL (ref <50)
Tramadol: NEGATIVE ng/mL (ref <100)

## 2022-09-10 LAB — DM TEMPLATE

## 2022-09-12 LAB — CBC+PLATELET+HEM REVIEW
BASO(ABSOLUTE): 0 10*3/uL (ref 0.0–0.2)
Basophils Manual: 1 %
EOS (ABSOLUTE VALUE): 0.1 10*3/uL (ref 0.0–0.4)
Eosinophils Manual: 2 %
Hematocrit: 37.9 % (ref 34.0–46.6)
Hemoglobin: 13 g/dL (ref 11.1–15.9)
Lymphocytes Absolute: 0.6 10*3/uL — ABNORMAL LOW (ref 0.7–3.1)
Lymphocytes Manual: 15 %
MCH: 30.4 pg (ref 26.6–33.0)
MCHC: 34.3 g/dL (ref 31.5–35.7)
MCV: 89 fL (ref 79–97)
Monocytes Manual: 7 %
Monocytes(Absolute): 0.3 10*3/uL (ref 0.1–0.9)
Neutrophils Absolute: 3.2 10*3/uL (ref 1.4–7.0)
Neutrophils Manual: 75 %
PLATELET COMMENT: ADEQUATE
Platelets: 204 10*3/uL (ref 150–450)
RBC COMMENT: NORMAL
RBC: 4.28 x10E6/uL (ref 3.77–5.28)
RDW: 14 % (ref 11.7–15.4)
WBC: 4.2 10*3/uL (ref 3.4–10.8)

## 2022-09-27 DIAGNOSIS — K080 Exfoliation of teeth due to systemic causes: Secondary | ICD-10-CM | POA: Diagnosis not present

## 2022-10-26 ENCOUNTER — Encounter: Payer: Self-pay | Admitting: Internal Medicine

## 2022-11-07 ENCOUNTER — Encounter: Payer: Self-pay | Admitting: Internal Medicine

## 2022-11-29 ENCOUNTER — Ambulatory Visit (AMBULATORY_SURGERY_CENTER): Payer: Medicare Other | Admitting: *Deleted

## 2022-11-29 VITALS — Ht 67.0 in | Wt 240.0 lb

## 2022-11-29 DIAGNOSIS — Z86010 Personal history of colonic polyps: Secondary | ICD-10-CM

## 2022-11-29 MED ORDER — NA SULFATE-K SULFATE-MG SULF 17.5-3.13-1.6 GM/177ML PO SOLN: 1.0000 | Freq: Once | ORAL | 0 refills | Status: AC

## 2022-11-29 NOTE — Progress Notes (Signed)
Pt's name and DOB verified at the beginning of the pre-visit.  Pt denies any difficulty with ambulating,sitting, laying down or rolling side to side Gave both [facility] main # and MD on call # prior to instructions.  No egg or soy allergy known to patient  No issues known to pt with past sedation with any surgeries or procedures Pt denies having issues being intubated Pt has no issues moving head neck or swallowing No FH of Malignant Hyperthermia Pt is not on diet pills Pt is not on home 02  Pt is not on blood thinners  Pt denies issues with constipation  Pt is not on dialysis Pt denise any abnormal heart rhythms  Pt denies any upcoming cardiac testing Pt encouraged to use to use Singlecare or Goodrx to reduce cost  Patient's chart reviewed by Cathlyn Parsons CNRA prior to pre-visit and patient appropriate for the [facility].  Pre-visit completed and red dot placed by patient's name on their procedure day (on provider's schedule).  . Visit by phone Pt states weight is 240lb Instructed pt why it is important to and  to call if they have any changes in health or new medications. Directed them to the # given and on instructions.   Pt states they will.  Instructions reviewed with pt and pt states understanding. Instructed to review again prior to procedure. Pt states they will.  Instructions sent by mail with coupon and by my chart

## 2022-12-02 ENCOUNTER — Other Ambulatory Visit: Payer: Self-pay | Admitting: Family Medicine

## 2022-12-02 DIAGNOSIS — E782 Mixed hyperlipidemia: Secondary | ICD-10-CM

## 2022-12-12 ENCOUNTER — Encounter: Payer: Self-pay | Admitting: Internal Medicine

## 2022-12-21 ENCOUNTER — Ambulatory Visit (AMBULATORY_SURGERY_CENTER): Payer: Medicare Other | Admitting: Internal Medicine

## 2022-12-21 ENCOUNTER — Encounter: Payer: Self-pay | Admitting: Internal Medicine

## 2022-12-21 VITALS — BP 117/53 | HR 57 | Temp 97.1°F | Resp 13 | Ht 67.0 in | Wt 240.0 lb

## 2022-12-21 DIAGNOSIS — Z86010 Personal history of colonic polyps: Secondary | ICD-10-CM | POA: Diagnosis not present

## 2022-12-21 DIAGNOSIS — K621 Rectal polyp: Secondary | ICD-10-CM | POA: Diagnosis not present

## 2022-12-21 DIAGNOSIS — D128 Benign neoplasm of rectum: Secondary | ICD-10-CM | POA: Diagnosis not present

## 2022-12-21 DIAGNOSIS — Z09 Encounter for follow-up examination after completed treatment for conditions other than malignant neoplasm: Secondary | ICD-10-CM

## 2022-12-21 MED ORDER — SODIUM CHLORIDE 0.9 % IV SOLN: 500.0000 mL | INTRAVENOUS | Status: DC

## 2022-12-21 NOTE — Progress Notes (Signed)
Called to room to assist during endoscopic procedure.  Patient ID and intended procedure confirmed with present staff. Received instructions for my participation in the procedure from the performing physician.  

## 2022-12-21 NOTE — Op Note (Signed)
[facility] Patient Name: Allison Cox Procedure Date: 12/21/2022 11:13 AM MRN: [id number] Endoscopist: Wilhemina Bonito. Marina Goodell , MD, [id number] Age: 77 Referring MD:  Date of Birth: 10/19/1945 Gender: Female Account #: [id number] Procedure:                Colonoscopy with cold snare polypectomy x 1 Indications:              High risk colon cancer surveillance: Personal                            history of multiple (3 or more) adenomas. Previous                            examinations 2005, 2010, 2014, 2019 Medicines:                Monitored Anesthesia Care Procedure:                Pre-Anesthesia Assessment:                           - Prior to the procedure, a History and Physical                            was performed, and patient medications and                            allergies were reviewed. The patient's tolerance of                            previous anesthesia was also reviewed. The risks                            and benefits of the procedure and the sedation                            options and risks were discussed with the patient.                            All questions were answered, and informed consent                            was obtained. Prior Anticoagulants: The patient has                            taken no anticoagulant or antiplatelet agents. ASA                            Grade Assessment: II - A patient with mild systemic                            disease. After reviewing the risks and benefits,                            the patient was deemed in satisfactory condition to  undergo the procedure.                           After obtaining informed consent, the colonoscope                            was passed under direct vision. Throughout the                            procedure, the patient's blood pressure, pulse, and                            oxygen saturations were monitored continuously. The                             CF [id number] #[id number] was introduced through the anus                            and advanced to the the cecum, identified by                            appendiceal orifice and ileocecal valve. The                            ileocecal valve, appendiceal orifice, and rectum                            were photographed. The quality of the bowel                            preparation was excellent. The colonoscopy was                            performed without difficulty. The patient tolerated                            the procedure well. The bowel preparation used was                            SUPREP via split dose instruction. Scope In: 11:28:46 AM Scope Out: 11:42:03 AM Scope Withdrawal Time: 0 hours 10 minutes 16 seconds  Total Procedure Duration: 0 hours 13 minutes 17 seconds  Findings:                 A 2 mm polyp was found in the rectum. The polyp was                            sessile. The polyp was removed with a cold snare.                            Resection and retrieval were complete.                           The exam was otherwise without abnormality on  direct and retroflexion views. Hypertrophic anal                            papilla noted. Complications:            No immediate complications. Estimated blood loss:                            None. Estimated Blood Loss:     Estimated blood loss: none. Impression:               - One 2 mm polyp in the rectum, removed with a cold                            snare. Resected and retrieved.                           - The examination was otherwise normal on direct                            and retroflexion views. Hypertrophic anal papilla Recommendation:           - Repeat colonoscopy is not recommended for                            surveillance.                           - Patient has a contact number available for                            emergencies. The signs and symptoms of potential                             delayed complications were discussed with the                            patient. Return to normal activities tomorrow.                            Written discharge instructions were provided to the                            patient.                           - Resume previous diet.                           - Continue present medications.                           - Await pathology results. Wilhemina Bonito. Marina Goodell, MD 12/21/2022 11:46:55 AM This report has been signed electronically.

## 2022-12-21 NOTE — Progress Notes (Signed)
Sedate, gd SR, tolerated procedure well, VSS, report to RN 

## 2022-12-21 NOTE — Patient Instructions (Signed)

## 2022-12-21 NOTE — Progress Notes (Signed)
HISTORY OF PRESENT ILLNESS:  Allison Cox is a 77 y.o. female with a personal history of multiple adenomatous colon polyps.  Presents today for surveillance colonoscopy.  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 Her smoking use included cigarettes. She has never used smokeless tobacco. She reports current alcohol use of about 3.0 - 4.0 standard drinks of alcohol per week. She reports that she does not use drugs.  family history includes Coronary artery disease in an other family member; Heart attack in her father; Stroke in her mother.  Allergies  Allergen Reactions   Codeine Nausea And Vomiting    SEVERE NAUSEA AND VOMITING-- do not give at all Hypotension severe       PHYSICAL EXAMINATION:  Vital signs: BP (!) 103/57   Pulse 62   Temp (!) 97.1 F (36.2 C) (Temporal)   Ht 5\' 7"  (1.702 m)   Wt 240 lb (108.9 kg)   SpO2 97%   BMI 37.59 kg/m  General: Well-developed, well-nourished, no acute distress HEENT: Sclerae are anicteric, conjunctiva pink. Oral mucosa intact Lungs: Clear Heart: Regular Abdomen: soft, nontender, nondistended, no obvious ascites, no peritoneal signs, normal bowel sounds. No organomegaly. Extremities: No edema Psychiatric: alert and oriented x3. Cooperative    ASSESSMENT:  Personal history of multiple adenomatous  colon polyps   PLAN:   Surveillance colonoscopy

## 2022-12-22 ENCOUNTER — Telehealth: Payer: Self-pay | Admitting: *Deleted

## 2022-12-22 NOTE — Telephone Encounter (Signed)
Left message on f/u call 

## 2022-12-27 ENCOUNTER — Encounter: Payer: Self-pay | Admitting: Internal Medicine

## 2023-02-01 DIAGNOSIS — H524 Presbyopia: Secondary | ICD-10-CM | POA: Diagnosis not present

## 2023-03-01 ENCOUNTER — Ambulatory Visit: Payer: Medicare Other | Admitting: Family Medicine

## 2023-03-01 ENCOUNTER — Encounter: Payer: Self-pay | Admitting: Family Medicine

## 2023-03-01 VITALS — BP 130/80 | HR 70 | Temp 97.5°F | Resp 18 | Ht 67.0 in | Wt 246.6 lb

## 2023-03-01 DIAGNOSIS — E559 Vitamin D deficiency, unspecified: Secondary | ICD-10-CM | POA: Diagnosis not present

## 2023-03-01 DIAGNOSIS — F411 Generalized anxiety disorder: Secondary | ICD-10-CM | POA: Diagnosis not present

## 2023-03-01 DIAGNOSIS — S161XXA Strain of muscle, fascia and tendon at neck level, initial encounter: Secondary | ICD-10-CM | POA: Diagnosis not present

## 2023-03-01 DIAGNOSIS — D229 Melanocytic nevi, unspecified: Secondary | ICD-10-CM

## 2023-03-01 DIAGNOSIS — E782 Mixed hyperlipidemia: Secondary | ICD-10-CM

## 2023-03-01 DIAGNOSIS — I10 Essential (primary) hypertension: Secondary | ICD-10-CM

## 2023-03-01 LAB — COMPREHENSIVE METABOLIC PANEL
ALT: 14 U/L (ref 0–35)
AST: 14 U/L (ref 0–37)
Albumin: 4.4 g/dL (ref 3.5–5.2)
Alkaline Phosphatase: 64 U/L (ref 39–117)
BUN: 15 mg/dL (ref 6–23)
CO2: 30 meq/L (ref 19–32)
Calcium: 9.3 mg/dL (ref 8.4–10.5)
Chloride: 102 meq/L (ref 96–112)
Creatinine, Ser: 0.7 mg/dL (ref 0.40–1.20)
GFR: 84.28 mL/min (ref >60.00)
Glucose, Bld: 99 mg/dL (ref 70–99)
Potassium: 4.2 meq/L (ref 3.5–5.1)
Sodium: 140 meq/L (ref 135–145)
Total Bilirubin: 0.5 mg/dL (ref 0.2–1.2)
Total Protein: 6.7 g/dL (ref 6.0–8.3)

## 2023-03-01 LAB — LIPID PANEL
Cholesterol: 176 mg/dL (ref 0–200)
HDL: 68.1 mg/dL (ref >39.00)
LDL Cholesterol: 86 mg/dL (ref 0–99)
NonHDL: 107.9
Total CHOL/HDL Ratio: 3
Triglycerides: 111 mg/dL (ref 0.0–149.0)
VLDL: 22.2 mg/dL (ref 0.0–40.0)

## 2023-03-01 LAB — CBC WITH DIFFERENTIAL/PLATELET
Basophils Absolute: 0 10*3/uL (ref 0.0–0.1)
Basophils Relative: 0.6 % (ref 0.0–3.0)
Eosinophils Absolute: 0.1 10*3/uL (ref 0.0–0.7)
Eosinophils Relative: 2.2 % (ref 0.0–5.0)
HCT: 40 % (ref 36.0–46.0)
Hemoglobin: 13.1 g/dL (ref 12.0–15.0)
Lymphocytes Relative: 15.1 % (ref 12.0–46.0)
Lymphs Abs: 0.7 10*3/uL (ref 0.7–4.0)
MCHC: 32.8 g/dL (ref 30.0–36.0)
MCV: 89.7 fL (ref 78.0–100.0)
Monocytes Absolute: 0.3 10*3/uL (ref 0.1–1.0)
Monocytes Relative: 6.8 % (ref 3.0–12.0)
Neutro Abs: 3.3 10*3/uL (ref 1.4–7.7)
Neutrophils Relative %: 75.3 % (ref 43.0–77.0)
Platelets: 193 10*3/uL (ref 150.0–400.0)
RBC: 4.46 Mil/uL (ref 3.87–5.11)
RDW: 14.1 % (ref 11.5–15.5)
WBC: 4.4 10*3/uL (ref 4.0–10.5)

## 2023-03-01 LAB — TSH: TSH: 4.12 u[IU]/mL (ref 0.35–5.50)

## 2023-03-01 LAB — VITAMIN D 25 HYDROXY (VIT D DEFICIENCY, FRACTURES): VITD: 30.66 ng/mL (ref 30.00–100.00)

## 2023-03-01 MED ORDER — CYCLOBENZAPRINE HCL 10 MG PO TABS: 10.0000 mg | ORAL_TABLET | Freq: Three times a day (TID) | ORAL | 0 refills | Status: DC | PRN

## 2023-03-01 MED ORDER — ATORVASTATIN CALCIUM 20 MG PO TABS
20.0000 mg | ORAL_TABLET | Freq: Every day | ORAL | 0 refills | Status: DC
Start: 2023-03-01 — End: 2023-08-17

## 2023-03-01 MED ORDER — LORAZEPAM 0.5 MG PO TABS
0.5000 mg | ORAL_TABLET | Freq: Three times a day (TID) | ORAL | 1 refills | Status: DC | PRN
Start: 2023-03-01 — End: 2023-08-17

## 2023-03-01 MED ORDER — VITAMIN D (ERGOCALCIFEROL) 1.25 MG (50000 UNIT) PO CAPS
50000.0000 [IU] | ORAL_CAPSULE | ORAL | 1 refills | Status: DC
Start: 2023-03-01 — End: 2023-08-17

## 2023-03-01 MED ORDER — SERTRALINE HCL 100 MG PO TABS
ORAL_TABLET | ORAL | 3 refills | Status: DC
Start: 2023-03-01 — End: 2023-08-17

## 2023-03-01 NOTE — Patient Instructions (Signed)
Hypertension, Adult High blood pressure (hypertension) is when the force of blood pumping through the arteries is too strong. The arteries are the blood vessels that carry blood from the heart throughout the body. Hypertension forces the heart to work harder to pump blood and may cause arteries to become narrow or stiff. Untreated or uncontrolled hypertension can lead to a heart attack, heart failure, a stroke, kidney disease, and other problems. A blood pressure reading consists of a higher number over a lower number. Ideally, your blood pressure should be below 120/80. The first ("top") number is called the systolic pressure. It is a measure of the pressure in your arteries as your heart beats. The second ("bottom") number is called the diastolic pressure. It is a measure of the pressure in your arteries as the heart relaxes. What are the causes? The exact cause of this condition is not known. There are some conditions that result in high blood pressure. What increases the risk? Certain factors may make you more likely to develop high blood pressure. Some of these risk factors are under your control, including: Smoking. Not getting enough exercise or physical activity. Being overweight. Having too much fat, sugar, calories, or salt (sodium) in your diet. Drinking too much alcohol. Other risk factors include: Having a personal history of heart disease, diabetes, high cholesterol, or kidney disease. Stress. Having a family history of high blood pressure and high cholesterol. Having obstructive sleep apnea. Age. The risk increases with age. What are the signs or symptoms? High blood pressure may not cause symptoms. Very high blood pressure (hypertensive crisis) may cause: Headache. Fast or irregular heartbeats (palpitations). Shortness of breath. Nosebleed. Nausea and vomiting. Vision changes. Severe chest pain, dizziness, and seizures. How is this diagnosed? This condition is diagnosed by  measuring your blood pressure while you are seated, with your arm resting on a flat surface, your legs uncrossed, and your feet flat on the floor. The cuff of the blood pressure monitor will be placed directly against the skin of your upper arm at the level of your heart. Blood pressure should be measured at least twice using the same arm. Certain conditions can cause a difference in blood pressure between your right and left arms. If you have a high blood pressure reading during one visit or you have normal blood pressure with other risk factors, you may be asked to: Return on a different day to have your blood pressure checked again. Monitor your blood pressure at home for 1 week or longer. If you are diagnosed with hypertension, you may have other blood or imaging tests to help your health care provider understand your overall risk for other conditions. How is this treated? This condition is treated by making healthy lifestyle changes, such as eating healthy foods, exercising more, and reducing your alcohol intake. You may be referred for counseling on a healthy diet and physical activity. Your health care provider may prescribe medicine if lifestyle changes are not enough to get your blood pressure under control and if: Your systolic blood pressure is above 130. Your diastolic blood pressure is above 80. Your personal target blood pressure may vary depending on your medical conditions, your age, and other factors. Follow these instructions at home: Eating and drinking  Eat a diet that is high in fiber and potassium, and low in sodium, added sugar, and fat. An example of this eating plan is called the DASH diet. DASH stands for Dietary Approaches to Stop Hypertension. To eat this way: Eat   plenty of fresh fruits and vegetables. Try to fill one half of your plate at each meal with fruits and vegetables. Eat whole grains, such as whole-wheat pasta, brown rice, or whole-grain bread. Fill about one  fourth of your plate with whole grains. Eat or drink low-fat dairy products, such as skim milk or low-fat yogurt. Avoid fatty cuts of meat, processed or cured meats, and poultry with skin. Fill about one fourth of your plate with lean proteins, such as fish, chicken without skin, beans, eggs, or tofu. Avoid pre-made and processed foods. These tend to be higher in sodium, added sugar, and fat. Reduce your daily sodium intake. Many people with hypertension should eat less than 1,500 mg of sodium a day. Do not drink alcohol if: Your health care provider tells you not to drink. You are pregnant, may be pregnant, or are planning to become pregnant. If you drink alcohol: Limit how much you have to: 0-1 drink a day for women. 0-2 drinks a day for men. Know how much alcohol is in your drink. In the [place] one drink equals one 12 oz bottle of beer (355 mL), one 5 oz glass of wine (148 mL), or one 1 oz glass of hard liquor (44 mL). Lifestyle  Work with your health care provider to maintain a healthy body weight or to lose weight. Ask what an ideal weight is for you. Get at least 30 minutes of exercise that causes your heart to beat faster (aerobic exercise) most days of the week. Activities may include walking, swimming, or biking. Include exercise to strengthen your muscles (resistance exercise), such as Pilates or lifting weights, as part of your weekly exercise routine. Try to do these types of exercises for 30 minutes at least 3 days a week. Do not use any products that contain nicotine or tobacco. These products include cigarettes, chewing tobacco, and vaping devices, such as e-cigarettes. If you need help quitting, ask your health care provider. Monitor your blood pressure at home as told by your health care provider. Keep all follow-up visits. This is important. Medicines Take over-the-counter and prescription medicines only as told by your health care provider. Follow directions carefully. Blood  pressure medicines must be taken as prescribed. Do not skip doses of blood pressure medicine. Doing this puts you at risk for problems and can make the medicine less effective. Ask your health care provider about side effects or reactions to medicines that you should watch for. Contact a health care provider if you: Think you are having a reaction to a medicine you are taking. Have headaches that keep coming back (recurring). Feel dizzy. Have swelling in your ankles. Have trouble with your vision. Get help right away if you: Develop a severe headache or confusion. Have unusual weakness or numbness. Feel faint. Have severe pain in your chest or abdomen. Vomit repeatedly. Have trouble breathing. These symptoms may be an emergency. Get help right away. Call 911. Do not wait to see if the symptoms will go away. Do not drive yourself to the hospital. Summary Hypertension is when the force of blood pumping through your arteries is too strong. If this condition is not controlled, it may put you at risk for serious complications. Your personal target blood pressure may vary depending on your medical conditions, your age, and other factors. For most people, a normal blood pressure is less than 120/80. Hypertension is treated with lifestyle changes, medicines, or a combination of both. Lifestyle changes include losing weight, eating a healthy,   low-sodium diet, exercising more, and limiting alcohol. This information is not intended to replace advice given to you by your health care provider. Make sure you discuss any questions you have with your health care provider. Document Revised: 03/08/2021 Document Reviewed: 03/08/2021 Elsevier Patient Education  2024 [employer]  

## 2023-03-01 NOTE — Assessment & Plan Note (Signed)
Well controlled, no changes to meds. Encouraged heart healthy diet such as the DASH diet and exercise as tolerated.  °

## 2023-03-01 NOTE — Progress Notes (Signed)
Established Patient Office Visit  Subjective   Patient ID: Allison Cox, female    DOB: January 13, 1946  Age: 77 y.o. MRN: [id number]  Chief Complaint  Patient presents with   Hyperlipidemia   Follow-up    HPI Discussed the use of AI scribe software for clinical note transcription with the patient, who gave verbal consent to proceed.  History of Present Illness   The patient presents with a skin lesion on the right leg that she has noticed 'getting deeper, thicker.' She describes it as a lump and is concerned about its appearance. She has not previously seen a dermatologist for this issue.  In addition to the skin lesion, the patient reports ongoing neck and shoulder pain due to 'discs scrunching up' and a pinched nerve. She requests a refill of her muscle relaxers for this issue.  The patient also inquires about weight loss drugs, expressing willingness to pay out-of-pocket if necessary. She has not previously taken these medications.      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 08/11/2009   LOW BACK PAIN, CHRONIC 08/11/2009   History of colonic polyps 08/17/2008   Acute upper respiratory infection 04/17/2008   SINUSITIS- ACUTE-NOS 03/30/2008   Hyperlipidemia 09/26/2007   Anxiety state 01/01/2007   CYST, SEBACEOUS 01/01/2007   Disorder of bone and cartilage 01/01/2007   ARTHROSCOPY, RIGHT KNEE, HX OF 01/01/2007   Past Medical  History:  Diagnosis Date   Anxiety    Arthritis    knee   Colon polyps    hyperplastic and Adenomatous   Hyperlipidemia    Osteopenia    Past Surgical History:  Procedure Laterality Date   CARPAL TUNNEL RELEASE Left    COLONOSCOPY  07/2012   Marina Goodell - polyps   KNEE ARTHROSCOPY Right    TOTAL KNEE ARTHROPLASTY Left 09/29/2021   Procedure: LEFT TOTAL KNEE ARTHROPLASTY;  Surgeon: Cammy Copa, MD;  Location: [facility] OR;  Service: Orthopedics;  Laterality: Left;   WISDOM TOOTH EXTRACTION     Social History   Tobacco Use   Smoking status: Former    Current packs/day: 0.00    Types: Cigarettes    Quit date: 05/15/1986    Years since quitting: 36.8   Smokeless tobacco: Never  Vaping Use   Vaping status: Never Used  Substance Use Topics   Alcohol use: Yes    Alcohol/week: 3.0 - 4.0 standard drinks of alcohol    Types: 3 - 4 Glasses of wine per week   Drug use: No   Social History   Socioeconomic History   Marital status: Single    Spouse name: Not on file   Number of children: 0   Years of education: Not on file   Highest education level: Not on file  Occupational History   Not on file  Tobacco Use   Smoking status: Former    Current packs/day: 0.00    Types: Cigarettes    Quit date:  05/15/1986    Years since quitting: 36.8   Smokeless tobacco: Never  Vaping Use   Vaping status: Never Used  Substance and Sexual Activity   Alcohol use: Yes    Alcohol/week: 3.0 - 4.0 standard drinks of alcohol    Types: 3 - 4 Glasses of wine per week   Drug use: No   Sexual activity: Yes    Partners: Male    Birth control/protection: Post-menopausal  Other Topics Concern   Not on file  Social History Narrative   Not on file   Social Determinants of Health   Financial Resource Strain: Not on file  Food Insecurity: Not on file  Transportation Needs: Not on file  Physical Activity: Not on file  Stress: Not on file  Social Connections: Not on file  Intimate Partner Violence:  Not on file   Family Status  Relation Name Status   Mother  Deceased at age 55   Father  Deceased   Other  (Not Specified)   Neg Hx  (Not Specified)  No partnership data on file   Family History  Problem Relation Age of Onset   Stroke Mother    Heart attack Father    Coronary artery disease Other    Colon cancer Neg Hx    Rectal cancer Neg Hx    Stomach cancer Neg Hx    Colon polyps Neg Hx    Esophageal cancer Neg Hx    Allergies  Allergen Reactions   Codeine Nausea And Vomiting    SEVERE NAUSEA AND VOMITING-- do not give at all Hypotension severe      Review of Systems  Constitutional:  Negative for chills, fever and malaise/fatigue.  HENT:  Negative for congestion and hearing loss.   Eyes:  Negative for blurred vision and discharge.  Respiratory:  Negative for cough, sputum production and shortness of breath.   Cardiovascular:  Negative for chest pain, palpitations and leg swelling.  Gastrointestinal:  Negative for abdominal pain, blood in stool, constipation, diarrhea, heartburn, nausea and vomiting.  Genitourinary:  Negative for dysuria, frequency, hematuria and urgency.  Musculoskeletal:  Negative for back pain, falls and myalgias.  Skin:  Negative for rash.  Neurological:  Negative for dizziness, sensory change, loss of consciousness, weakness and headaches.  Endo/Heme/Allergies:  Negative for environmental allergies. Does not bruise/bleed easily.  Psychiatric/Behavioral:  Negative for depression and suicidal ideas. The patient is not nervous/anxious and does not have insomnia.       Objective:     BP 130/80 (BP Location: Left Arm, Patient Position: Sitting, Cuff Size: Large)   Pulse 70   Temp (!) 97.5 F (36.4 C) (Oral)   Resp 18   Ht 5\' 7"  (1.702 m)   Wt 246 lb 9.6 oz (111.9 kg)   SpO2 98%   BMI 38.62 kg/m  BP Readings from Last 3 Encounters:  03/01/23 130/80  12/21/22 (!) 117/53  09/07/22 130/82   Wt Readings from Last 3 Encounters:  03/01/23  246 lb 9.6 oz (111.9 kg)  12/21/22 240 lb (108.9 kg)  11/29/22 240 lb (108.9 kg)   SpO2 Readings from Last 3 Encounters:  03/01/23 98%  12/21/22 99%  09/07/22 97%      Physical Exam Vitals and nursing note reviewed.  Constitutional:      General: She is not in acute distress.    Appearance: Normal appearance. She is well-developed.  HENT:     Head: Normocephalic and atraumatic.  Eyes:     General:  No scleral icterus.       Right eye: No discharge.        Left eye: No discharge.  Cardiovascular:     Rate and Rhythm: Normal rate and regular rhythm.     Heart sounds: No murmur heard. Pulmonary:     Effort: Pulmonary effort is normal. No respiratory distress.     Breath sounds: Normal breath sounds.  Musculoskeletal:        General: Normal range of motion.     Cervical back: Normal range of motion and neck supple.     Right lower leg: No edema.     Left lower leg: No edema.  Skin:    General: Skin is warm and dry.  Neurological:     Mental Status: She is alert and oriented to person, place, and time.  Psychiatric:        Mood and Affect: Mood normal.        Behavior: Behavior normal.        Thought Content: Thought content normal.        Judgment: Judgment normal.      No results found for any visits on 03/01/23.  Last CBC Lab Results  Component Value Date   WBC 4.2 09/07/2022   HGB 13.0 09/07/2022   HCT 37.9 09/07/2022   MCV 89 09/07/2022   MCH 30.4 09/07/2022   RDW 14.0 09/07/2022   PLT 204 09/07/2022   Last metabolic panel Lab Results  Component Value Date   GLUCOSE 96 09/07/2022   NA 139 09/07/2022   K 4.7 09/07/2022   CL 101 09/07/2022   CO2 30 09/07/2022   BUN 16 09/07/2022   CREATININE 0.77 09/07/2022   GFR 75.42 09/07/2022   CALCIUM 9.3 09/07/2022   PROT 6.8 09/07/2022   ALBUMIN 4.2 09/07/2022   BILITOT 0.5 09/07/2022   ALKPHOS 61 09/07/2022   AST 16 09/07/2022   ALT 16 09/07/2022   ANIONGAP 5 09/21/2021   Last lipids Lab Results   Component Value Date   CHOL 188 09/07/2022   HDL 73.40 09/07/2022   LDLCALC 94 09/07/2022   LDLDIRECT 125.1 10/06/2011   TRIG 103.0 09/07/2022   CHOLHDL 3 09/07/2022   Last hemoglobin A1c No results found for: "HGBA1C" Last thyroid functions Lab Results  Component Value Date   TSH 3.62 07/05/2021   T4TOTAL 5.3 07/05/2021   Last vitamin D Lab Results  Component Value Date   VD25OH 35.74 09/07/2022   Last vitamin B12 and Folate Lab Results  Component Value Date   VITAMINB12 265 07/05/2021      The 10-year ASCVD risk score (Arnett DK, et al., 2019) is: 16.5%    Assessment & Plan:   Problem List Items Addressed This Visit       Unprioritized   Generalized anxiety disorder   Relevant Medications   LORazepam (ATIVAN) 0.5 MG tablet   sertraline (ZOLOFT) 100 MG tablet   Vitamin D deficiency   Relevant Medications   Vitamin D, Ergocalciferol, (DRISDOL) 1.25 MG (50000 UNIT) CAPS capsule   Other Relevant Orders   VITAMIN D 25 Hydroxy (Vit-D Deficiency, Fractures)   Hyperlipidemia   Relevant Medications   atorvastatin (LIPITOR) 20 MG tablet   Other Relevant Orders   CBC with Differential/Platelet   Comprehensive metabolic panel   Lipid panel   TSH   Cervical strain   Relevant Medications   cyclobenzaprine (FLEXERIL) 10 MG tablet   Primary hypertension    Well controlled, no changes to  meds. Encouraged heart healthy diet such as the DASH diet and exercise as tolerated.        Relevant Medications   atorvastatin (LIPITOR) 20 MG tablet   Other Visit Diagnoses     Suspicious nevus    -  Primary   Relevant Orders   Ambulatory referral to Dermatology   Morbid obesity (HCC)       Relevant Orders   Insulin, random     Assessment and Plan    Skin Lesion New lesion on right leg with recent changes in thickness. Suspected skin cancer. -Refer to dermatology for evaluation and possible biopsy. -If unable to get timely appointment, consider biopsy in primary  care setting.  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 -Refill muscle relaxant as requested.  Weight Management Interest in weight loss medications. Discussed potential coverage issues with Medicare. -Check labs to assess for insulin resistance as potential coverage qualifier. -Discussed potential out-of-pocket costs and administration methods for Zepbound.  Tetanus Vaccination Overdue for tetanus booster. -Recommend receiving tetanus booster at pharmacy today.        Return in about 6 months (around 08/30/2023), or if symptoms worsen or fail to improve.    [name], DO

## 2023-03-02 LAB — INSULIN, RANDOM: Insulin: 10.7 u[IU]/mL

## 2023-03-07 ENCOUNTER — Encounter: Payer: Self-pay | Admitting: Family Medicine

## 2023-03-08 ENCOUNTER — Other Ambulatory Visit: Payer: Self-pay | Admitting: Family Medicine

## 2023-03-08 DIAGNOSIS — E782 Mixed hyperlipidemia: Secondary | ICD-10-CM

## 2023-03-08 DIAGNOSIS — I10 Essential (primary) hypertension: Secondary | ICD-10-CM

## 2023-03-08 DIAGNOSIS — E88819 Insulin resistance, unspecified: Secondary | ICD-10-CM

## 2023-03-08 MED ORDER — TIRZEPATIDE-WEIGHT MANAGEMENT 2.5 MG/0.5ML ~~LOC~~ SOLN
2.5000 mg | SUBCUTANEOUS | 0 refills | Status: DC
Start: 2023-03-08 — End: 2023-03-16

## 2023-03-16 ENCOUNTER — Other Ambulatory Visit: Payer: Self-pay | Admitting: Family Medicine

## 2023-03-16 DIAGNOSIS — I10 Essential (primary) hypertension: Secondary | ICD-10-CM

## 2023-03-16 DIAGNOSIS — E88819 Insulin resistance, unspecified: Secondary | ICD-10-CM

## 2023-03-16 DIAGNOSIS — E782 Mixed hyperlipidemia: Secondary | ICD-10-CM

## 2023-03-16 MED ORDER — TIRZEPATIDE-WEIGHT MANAGEMENT 2.5 MG/0.5ML ~~LOC~~ SOLN
2.5000 mg | SUBCUTANEOUS | 0 refills | Status: DC
Start: 2023-03-16 — End: 2023-03-27

## 2023-03-16 NOTE — Telephone Encounter (Signed)
Please advise on weight loss medication you would like sent.

## 2023-03-26 DIAGNOSIS — K080 Exfoliation of teeth due to systemic causes: Secondary | ICD-10-CM | POA: Diagnosis not present

## 2023-03-27 MED ORDER — TIRZEPATIDE-WEIGHT MANAGEMENT 2.5 MG/0.5ML ~~LOC~~ SOLN: 2.5000 mg | SUBCUTANEOUS | 2 refills | Status: DC

## 2023-04-03 ENCOUNTER — Encounter: Payer: Self-pay | Admitting: Dermatology

## 2023-04-03 ENCOUNTER — Ambulatory Visit: Payer: Medicare Other | Admitting: Dermatology

## 2023-04-03 VITALS — BP 125/77 | HR 94

## 2023-04-03 DIAGNOSIS — C44729 Squamous cell carcinoma of skin of left lower limb, including hip: Secondary | ICD-10-CM | POA: Diagnosis not present

## 2023-04-03 DIAGNOSIS — D492 Neoplasm of unspecified behavior of bone, soft tissue, and skin: Secondary | ICD-10-CM

## 2023-04-03 DIAGNOSIS — D485 Neoplasm of uncertain behavior of skin: Secondary | ICD-10-CM

## 2023-04-03 DIAGNOSIS — Z1283 Encounter for screening for malignant neoplasm of skin: Secondary | ICD-10-CM

## 2023-04-03 NOTE — Progress Notes (Signed)
   New Patient Visit   Subjective  Allison Cox is a 77 y.o. female who presents for the following: Skin Cancer Screening and Skin Exam of right leg. Pt has no hx of skin cancer and no hx in family.  She has a spot of concern on her left lower leg, present for several months, growing, itchy, tender, not previously treated.  The following portions of the chart were reviewed this encounter and updated as appropriate: medications, allergies, medical history  Review of Systems:  No other skin or systemic complaints except as noted in HPI or Assessment and Plan.  Objective  Well appearing patient in no apparent distress; mood and affect are within normal limits.  A full examination was performed of the patients right leg.  Relevant physical exam findings are noted in the Assessment and Plan.  Left Lower Leg - Anterior Hyperkeratotic pink papule on left lower leg        Assessment & Plan   SKIN CANCER SCREENING PERFORMED TODAY.  Biopsy (shave technique) Procedure Note: Number of shave biopsies performed: 1  After verbal consent was obtained, the area was marked, photographed, prepped with alcohol, and anesthetized with 2% lidocaine with epinephrine. Biopsy was performed using a shave technique and hemostasis was achieved using aluminum chloride. The area was then dressed with petroleum jelly and a bandage. The patient was instructed on wound care and given a handout with this information. The patient will be notified of the pathology results.  Specimen A Location: left lower leg DDX: NMSC vs other    Return if symptoms worsen or fail to improve.  I, Allison Cox, CMA, am acting as scribe for Allison Daily, MD.   Documentation: I have reviewed the above documentation for accuracy and completeness, and I agree with the above.  Allison Daily, MD

## 2023-04-03 NOTE — Patient Instructions (Signed)
Skin Education :   I counseled the patient regarding the following: Sun screen (SPF 30 or greater) should be applied during peak UV exposure (between 10am and 2pm) and reapplied after exercise or swimming.  The ABCDEs of melanoma were reviewed with the patient, and the importance of monthly self-examination of moles was emphasized. Should any moles change in shape or color, or itch, bleed or burn, pt will contact our office for evaluation sooner then their interval appointment.  Plan: Sunscreen Recommendations I recommended a broad spectrum sunscreen with a SPF of 30 or higher. I explained that SPF 30 sunscreens block approximately 97 percent of the sun's harmful rays. Sunscreens should be applied at least 15 minutes prior to expected sun exposure and then every 2 hours after that as long as sun exposure continues. If swimming or exercising sunscreen should be reapplied every 45 minutes to an hour after getting wet or sweating. One ounce, or the equivalent of a shot glass full of sunscreen, is adequate to protect the skin not covered by a bathing suit. I also recommended a lip balm with a sunscreen as well. Sun protective clothing can be used in lieu of sunscreen but must be worn the entire time you are exposed to the sun's rays.  Important Information   Due to recent changes in healthcare laws, you may see results of your pathology and/or laboratory studies on MyChart before the doctors have had a chance to review them. We understand that in some cases there may be results that are confusing or concerning to you. Please understand that not all results are received at the same time and often the doctors may need to interpret multiple results in order to provide you with the best plan of care or course of treatment. Therefore, we ask that you please give [place] 2 business days to thoroughly review all your results before contacting the office for clarification. Should we see a critical lab result, you will be  contacted sooner.     If You Need Anything After Your Visit   If you have any questions or concerns for your doctor, please call our main line at [phone]. If no one answers, please leave a voicemail as directed and we will return your call as soon as possible. Messages left after 4 pm will be answered the following business day.    You may also send [place] a message via MyChart. We typically respond to MyChart messages within 1-2 business days.  For prescription refills, please ask your pharmacy to contact our office. Our fax number is [phone].  If you have an urgent issue when the clinic is closed that cannot wait until the next business day, you can page your doctor at the number below.     Please note that while we do our best to be available for urgent issues outside of office hours, we are not available 24/7.    If you have an urgent issue and are unable to reach [place], you may choose to seek medical care at your doctor's office, retail clinic, urgent care center, or emergency room.   If you have a medical emergency, please immediately call 911 or go to the emergency department. In the event of inclement weather, please call our main line at [phone] for an update on the status of any delays or closures.  Dermatology Medication Tips: Please keep the boxes that topical medications come in in order to help keep track of the instructions about where and how to  use these. Pharmacies typically print the medication instructions only on the boxes and not directly on the medication tubes.   If your medication is too expensive, please contact our office at [phone] or send [place] a message through MyChart.    We are unable to tell what your co-pay for medications will be in advance as this is different depending on your insurance coverage. However, we may be able to find a substitute medication at lower cost or fill out paperwork to get insurance to cover a needed medication.    If a  prior authorization is required to get your medication covered by your insurance company, please allow [place] 1-2 business days to complete this process.   Drug prices often vary depending on where the prescription is filled and some pharmacies may offer cheaper prices.   The website www.goodrx.com contains coupons for medications through different pharmacies. The prices here do not account for what the cost may be with help from insurance (it may be cheaper with your insurance), but the website can give you the price if you did not use any insurance.  - You can print the associated coupon and take it with your prescription to the pharmacy.  - You may also stop by our office during regular business hours and pick up a GoodRx coupon card.  - If you need your prescription sent electronically to a different pharmacy, notify our office through [facility] or by phone at [phone]

## 2023-04-05 ENCOUNTER — Telehealth: Payer: Self-pay

## 2023-04-05 NOTE — Telephone Encounter (Signed)
Pt called back and was given results and sched for [facility]

## 2023-04-05 NOTE — Telephone Encounter (Signed)
-----   Message from [facility] sent at 04/04/2023  4:56 PM EST ----- SCC- left lower leg- Mohs with me  Please call patient to discuss diagnosis and schedule for Mohs surgery.

## 2023-04-11 ENCOUNTER — Encounter: Payer: Medicare Other | Admitting: Dermatology

## 2023-04-18 MED ORDER — TIRZEPATIDE-WEIGHT MANAGEMENT 5 MG/0.5ML ~~LOC~~ SOLN: 5.0000 mg | SUBCUTANEOUS | 0 refills | Status: DC

## 2023-04-19 ENCOUNTER — Ambulatory Visit: Payer: Medicare Other | Admitting: Dermatology

## 2023-04-19 ENCOUNTER — Encounter: Payer: Self-pay | Admitting: Dermatology

## 2023-04-19 VITALS — BP 115/70 | HR 75 | Temp 97.3°F

## 2023-04-19 DIAGNOSIS — C4492 Squamous cell carcinoma of skin, unspecified: Secondary | ICD-10-CM

## 2023-04-19 LAB — SURGICAL PATHOLOGY

## 2023-04-19 MED ORDER — MUPIROCIN 2 % EX OINT: 1.0000 | TOPICAL_OINTMENT | Freq: Two times a day (BID) | CUTANEOUS | 0 refills | Status: DC

## 2023-04-19 NOTE — Patient Instructions (Signed)

## 2023-04-19 NOTE — Progress Notes (Addendum)
Follow-Up Visit   Subjective  Allison Cox is a 77 y.o. female who presents for the following: Mohs of right lower leg-anterior, biopsied by Dr. Caralyn Guile.  The following portions of the chart were reviewed this encounter and updated as appropriate: medications, allergies, medical history  Review of Systems:  No other skin or systemic complaints except as noted in HPI or Assessment and Plan.  Objective  Well appearing patient in no apparent distress; mood and affect are within normal limits.  A focused examination was performed of the following areas: Right lower leg anterior Relevant physical exam findings are noted in the Assessment and Plan.   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  Mohs surgery   Procedure Details: Timeout: pre-procedure verification complete Procedure Prep: patient was prepped and draped in usual sterile fashion Prep type: isopropyl alcohol Pre-Op diagnosis: squamous cell carcinoma SCC subtype: well differentiated Surgical site (from skin exam): Right Lower Leg - Anterior Pre-operative length (cm): 1.5 Pre-operative width (cm): 1.7  Micrographic Surgery Details: Post-operative length (cm): 2 Post-operative width (cm): 1.8 Number of Mohs stages: 1  Skin repair Complexity:  Complex Final length (cm):  5.5 Timeout: patient name, date of birth, surgical site, and procedure verified   Procedure prep:  Patient was prepped and draped in usual sterile fashion Prep type:  Chlorhexidine Anesthesia: the lesion was anesthetized in a standard fashion   Anesthetic:  1% lidocaine w/ epinephrine 1-100,000 local infiltration Reason for type of repair: reduce tension to allow closure, allow closure of the large defect, preserve normal anatomy, preserve normal anatomical and functional relationships and avoid adjacent structures   Undermining: area extensively undermined    Subcutaneous layers (deep stitches):  Suture size:  4-0 Suture type: Vicryl (polyglactin 910)   Stitches:  Buried vertical mattress Fine/surface layer approximation (top stitches):  Suture size:  4-0 Suture type: Prolene (polypropylene)   Stitches: vertical mattress   Suture removal (days):  14 Hemostasis achieved with: suture, pressure and electrodesiccation Outcome: patient tolerated procedure well with no complications   Post-procedure details: sterile dressing applied and wound care instructions given    Related Medications mupirocin ointment (BACTROBAN) 2 % Apply 1 Application topically 2 (two) times daily.    Return for 04/02/23 wound check and suture removal.  I, Tillie Fantasia, CMA, am acting as scribe for Gwenith Daily, MD.    04/25/2023  HISTORY OF PRESENT ILLNESS  Allison Cox is seen in consultation at the request of Dr. 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 They note that the area has been present for about 6 months increasing in size with time.  There is no history of previous treatment.  Reports no other new or changing lesions and has no other complaints today.  Medications and allergies: see patient chart.  Review of systems: Reviewed 8 systems and notable for the above skin cancer.  All other systems reviewed are unremarkable/negative, unless noted in the HPI. Past medical history, surgical history, family history, social history were also reviewed and are noted in the chart/questionnaire.    PHYSICAL EXAMINATION  General: Well-appearing, in no acute distress, alert and oriented x 4. Vitals reviewed in chart (if available).   Skin: Exam reveals a 1.5 x 1.7 cm erythematous papule and biopsy scar on the right lower leg. There are rhytids, telangiectasias, and  lentigines, consistent with photodamage.  Biopsy report(s) reviewed, confirming the diagnosis.   ASSESSMENT  1) Well Differentiated Squamous  Cell Carcinoma on the right lower leg 2) photodamage 3) solar lentigines   PLAN   1. Due to location, size, histology, or recurrence and the likelihood of subclinical extension as well as the need to conserve normal surrounding tissue, the patient was deemed acceptable for Mohs micrographic surgery (MMS).  The nature and purpose of the procedure, associated benefits and risks including recurrence and scarring, possible complications such as pain, infection, and bleeding, and alternative methods of treatment if appropriate were discussed with the patient during consent. The lesion location was verified by the patient, by reviewing previous notes, pathology reports, and by photographs as well as angulation measurements if available.  Informed consent was reviewed and signed by the patient, and timeout was performed at 8:45 AM. See op note below.  2. For the photodamage and solar lentigines, sun protection discussed/information given on OTC sunscreens, and we recommend continued regular follow-up with primary dermatologist every 6 months or sooner for any growing, bleeding, or changing lesions. 3. Prognosis and future surveillance discussed. 4. Letter with treatment outcome sent to referring provider. 5. Pain acetaminophen/ibuprofen   MOHS MICROGRAPHIC SURGERY AND RECONSTRUCTION  Initial size:   1.5 x 1.7 cm Surgical defect/wound size: 2.0 x 1.8 cm Anesthesia:    0.33% lidocaine with 1:200,000 epinephrine EBL:    <5 mL Complications:  None Repair type:   Complex SQ suture:   4-0 Vicryl Cutaneous suture:  4-0 Prolene Final size of the repair: 5.5 cm  Stages: 1  STAGE I: Anesthesia achieved with 0.5% lidocaine with 1:200,000 epinephrine. ChloraPrep applied. 1 section(s) excised using Mohs technique (this includes total peripheral and deep tissue margin excision and evaluation with frozen sections, excised and interpreted by the same physician). The tumor was first debulked and then excised with  an approx. 2mm margin.  Hemostasis was achieved with electrocautery as needed.  The specimen was then oriented, subdivided/relaxed, inked, and processed using Mohs technique.    Frozen section analysis revealed a clear deep and peripheral margin  Reconstruction  The surgical wound was then cleaned, prepped, and re-anesthetized as above. Wound edges were undermined extensively along at least one entire edge and at a distance equal to or greater than the width of the defect (see wound defect size above) in order to achieve closure and decrease wound tension and anatomic distortion. Redundant tissue repair including standing cone removal was performed. Hemostasis was achieved with electrocautery. Subcutaneous and epidermal tissues were approximated with the above sutures. The surgical site was then lightly scrubbed with sterile, saline-soaked gauze. The area was then bandaged using Vaseline ointment, non-adherent gauze, gauze pads, and tape to provide an adequate pressure dressing. The patient tolerated the procedure well, was given detailed written and verbal wound care instructions, and was discharged in good condition.    The patient will follow-up: 2 weeks.   Options for management of the wound were discussed. Given the location and size/depth of the wound, Primary repair planned.  Burow's triangles planned to follow relaxed skin tension lines.  Anesthesia achieved, chloraprep applied and a sterile drape placed.  Burow's triangles were excised.  Undermining carried out minimally in a subcutaneous plane to facilitate tension-free closure.  Layered closure completed using 4-0 Vicryl for buried vertical mattress sutures followed by 4-0 prolene in a vertical mattress approach.   Documentation: I have reviewed the above documentation for accuracy and completeness, and I agree  with the above.  Gwenith Daily, MD

## 2023-05-02 ENCOUNTER — Encounter: Payer: Self-pay | Admitting: Dermatology

## 2023-05-02 ENCOUNTER — Ambulatory Visit: Payer: Medicare Other | Admitting: Dermatology

## 2023-05-02 VITALS — BP 123/71 | HR 76

## 2023-05-02 DIAGNOSIS — L539 Erythematous condition, unspecified: Secondary | ICD-10-CM | POA: Diagnosis not present

## 2023-05-02 DIAGNOSIS — C4492 Squamous cell carcinoma of skin, unspecified: Secondary | ICD-10-CM

## 2023-05-02 DIAGNOSIS — L905 Scar conditions and fibrosis of skin: Secondary | ICD-10-CM

## 2023-05-02 DIAGNOSIS — Z85828 Personal history of other malignant neoplasm of skin: Secondary | ICD-10-CM

## 2023-05-02 NOTE — Progress Notes (Signed)
   New Patient Visit   Subjective  Allison Cox is a 77 y.o. female who presents for the following: follow up from Mohs surgery and suture removal.  The patient presents for follow up from Mohs surgery for a SCC on the right lower leg, treated on 04/19/23, repaired with a complex closure. The patient has been bandaging the wound as directed. The endorse the following concerns: no questions or concerns at this time.  The following portions of the chart were reviewed this encounter and updated as appropriate: medications, allergies, medical history  Review of Systems:  No other skin or systemic complaints except as noted in HPI or Assessment and Plan.  Objective  Well appearing patient in no apparent distress; mood and affect are within normal limits.  A full examination was performed including scalp, head, face and right leg All findings within normal limits unless otherwise noted below.  Healing wound with mild erythema  Relevant physical exam findings are noted in the Assessment and Plan.     Assessment & Plan   Healing s/p Mohs for [facility], treated on 04/19/23, repaired with complex repair - Reassured that wound is healing well - Suture removed today - No evidence of infection - No swelling, induration, purulence, dehiscence, or tenderness out of proportion to the clinical exam, see photo above - Discussed that scars take up to 12 months to mature from the date of surgery - Recommend SPF 30+ to scar daily to prevent purple color from UV exposure during scar maturation process - Discussed that erythema and raised appearance of scar will fade over the next 4-6 months - OK to start scar massage at 4-6 weeks post-op - Can consider silicone based products for scar healing starting at 6 weeks post-op - Ok to continue ointment daily to wound under a bandage for another 1-2 wks.  Return if symptoms worsen or fail to improve.  Dominga Ferry, Surg Tech III, am acting as scribe for Gwenith Daily, MD.   Documentation: I have reviewed the above documentation for accuracy and completeness, and I agree with the above.  Gwenith Daily, MD

## 2023-05-02 NOTE — Patient Instructions (Signed)
Skin Education :   I counseled the patient regarding the following: Sun screen (SPF 30 or greater) should be applied during peak UV exposure (between 10am and 2pm) and reapplied after exercise or swimming.  The ABCDEs of melanoma were reviewed with the patient, and the importance of monthly self-examination of moles was emphasized. Should any moles change in shape or color, or itch, bleed or burn, pt will contact our office for evaluation sooner then their interval appointment.  Plan: Sunscreen Recommendations I recommended a broad spectrum sunscreen with a SPF of 30 or higher. I explained that SPF 30 sunscreens block approximately 97 percent of the sun's harmful rays. Sunscreens should be applied at least 15 minutes prior to expected sun exposure and then every 2 hours after that as long as sun exposure continues. If swimming or exercising sunscreen should be reapplied every 45 minutes to an hour after getting wet or sweating. One ounce, or the equivalent of a shot glass full of sunscreen, is adequate to protect the skin not covered by a bathing suit. I also recommended a lip balm with a sunscreen as well. Sun protective clothing can be used in lieu of sunscreen but must be worn the entire time you are exposed to the sun's rays.

## 2023-05-24 ENCOUNTER — Other Ambulatory Visit: Payer: Self-pay | Admitting: Family Medicine

## 2023-05-30 ENCOUNTER — Encounter: Payer: Self-pay | Admitting: Dermatology

## 2023-05-30 ENCOUNTER — Ambulatory Visit: Payer: Medicare Other | Admitting: Dermatology

## 2023-05-30 DIAGNOSIS — L578 Other skin changes due to chronic exposure to nonionizing radiation: Secondary | ICD-10-CM

## 2023-05-30 DIAGNOSIS — L814 Other melanin hyperpigmentation: Secondary | ICD-10-CM | POA: Diagnosis not present

## 2023-05-30 DIAGNOSIS — L821 Other seborrheic keratosis: Secondary | ICD-10-CM | POA: Diagnosis not present

## 2023-05-30 DIAGNOSIS — Z1283 Encounter for screening for malignant neoplasm of skin: Secondary | ICD-10-CM | POA: Diagnosis not present

## 2023-05-30 DIAGNOSIS — W908XXA Exposure to other nonionizing radiation, initial encounter: Secondary | ICD-10-CM

## 2023-05-30 DIAGNOSIS — L570 Actinic keratosis: Secondary | ICD-10-CM

## 2023-05-30 DIAGNOSIS — L738 Other specified follicular disorders: Secondary | ICD-10-CM

## 2023-05-30 DIAGNOSIS — Z85828 Personal history of other malignant neoplasm of skin: Secondary | ICD-10-CM

## 2023-05-30 DIAGNOSIS — D1801 Hemangioma of skin and subcutaneous tissue: Secondary | ICD-10-CM

## 2023-05-30 DIAGNOSIS — D229 Melanocytic nevi, unspecified: Secondary | ICD-10-CM

## 2023-05-30 NOTE — Progress Notes (Signed)
   Follow-Up Visit   Subjective  Allison Cox is a 78 y.o. female who presents for the following: Skin Cancer Screening and Full Body Skin Exam. Patient has a hx if SCC. No family hx of skin cancer.  The patient presents for Total-Body Skin Exam (TBSE) for skin cancer screening and mole check. The patient has spots, moles and lesions to be evaluated, some may be new or changing.  The following portions of the chart were reviewed this encounter and updated as appropriate: medications, allergies, medical history  Review of Systems:  No other skin or systemic complaints except as noted in HPI or Assessment and Plan.  Objective  Well appearing patient in no apparent distress; mood and affect are within normal limits.  A full examination was performed including scalp, head, eyes, ears, nose, lips, neck, chest, axillae, abdomen, back, buttocks, bilateral upper extremities, bilateral lower extremities, hands, feet, fingers, toes, fingernails, and toenails. All findings within normal limits unless otherwise noted below.   Relevant physical exam findings are noted in the Assessment and Plan.  Left chest Erythematous thin papules/macules with gritty scale.   Assessment & Plan   SKIN CANCER SCREENING PERFORMED TODAY.  ACTINIC DAMAGE - Chronic condition, secondary to cumulative UV/sun exposure - diffuse scaly erythematous macules with underlying dyspigmentation - Recommend daily broad spectrum sunscreen SPF 30+ to sun-exposed areas, reapply every 2 hours as needed.  - Staying in the shade or wearing long sleeves, sun glasses (UVA+UVB protection) and wide brim hats (4-inch brim around the entire circumference of the hat) are also recommended for sun protection.  - Call for new or changing lesions.  LENTIGINES, SEBORRHEIC KERATOSES, HEMANGIOMAS - Benign normal skin lesions - Benign-appearing - Call for any changes  Sebaceous Hyperplasia - Small yellow papules with a central dell -  Benign-appearing - Observe. Call for changes.   MELANOCYTIC NEVI - Tan-brown and/or pink-flesh-colored symmetric macules and papules - Benign appearing on exam today - Observation - Call clinic for new or changing moles - Recommend daily use of broad spectrum spf 30+ sunscreen to sun-exposed areas.   HISTORY OF SQUAMOUS CELL CARCINOMA OF LEFT LOWER LEG - No evidence of recurrence today - No lymphadenopathy - Recommend regular full body skin exams - Recommend daily broad spectrum sunscreen SPF 30+ to sun-exposed areas, reapply every 2 hours as needed.  - Call if any new or changing lesions are noted between office visits - Reassured that wound has healed well - Discussed that scars take up to 12 months to mature from the date of surgery - Recommend SPF 30+ to scar daily to prevent purple color - OK to start scar massage at 4-6 weeks post-op - Can consider silicone based products for scar healing  AK (ACTINIC KERATOSIS) Left chest Destruction of lesion - Left chest Complexity: simple   Destruction method: cryotherapy   Informed consent: discussed and consent obtained   Timeout:  patient name, date of birth, surgical site, and procedure verified Lesion destroyed using liquid nitrogen: Yes   Region frozen until ice ball extended beyond lesion: Yes   Outcome: patient tolerated procedure well with no complications   Post-procedure details: wound care instructions given   Return in about 6 months (around 11/27/2023) for TBSE.  I, Eliot Guernsey, CMA, am acting as scribe for Deneise Finlay, MD .   Documentation: I have reviewed the above documentation for accuracy and completeness, and I agree with the above.  Deneise Finlay, MD

## 2023-05-30 NOTE — Patient Instructions (Addendum)
 Cryotherapy Aftercare  Wash gently with soap and water everyday.   Apply Vaseline and Band-Aid daily until healed.          Post-Operative Scar Care: Education and Recommendations  Following your procedure, it's important to care for your scar to promote optimal healing and minimize its appearance. Proper post-operative care can help ensure that the scar heals well, and with time, it may become less noticeable. Below are key recommendations for scar care, including scar massage and the use of silicone scar gels or sheets.  1. General Scar Care Tips: -  Keep the wound clean and dry: Follow your healthcare provider's instructions for wound care, including cleaning the site and changing dressings as needed. -  Avoid sun exposure: Direct sunlight can darken scars and make them more noticeable. Once your wound has healed, apply sunscreen (SPF 30 or higher) to protect the scar from UV rays.  2. Scar Massage: - Start after healing: Wait until the scar has fully healed, with no scabs or open areas (usually 4-6 weeks after surgery). Your healthcare provider will give you specific guidance on when to begin. - Technique: Gently massage the scar in a circular motion for 5-10 minutes, 2-3 times per day. This helps to soften the tissue, reduce swelling, and improve the overall appearance of the scar. - Pressure: Apply gentle, firm pressure during the massage to break down the dense tissue that may form during healing. This helps to prevent the formation of keloids or hypertrophic scars. - Use lotion or ointment: Consider using a mild, fragrance-free lotion or vitamin E ointment to help lubricate the area during massage.  3. Silicone Scar Gels or Sheets: - When to start: Once your wound has healed completely, typically around 4-6 weeks, you can begin using silicone-based scar gels or sheets. These have been shown to improve scar appearance by hydrating the tissue and reducing inflammation. - How to use  silicone gels: Apply a thin layer of the gel to the scar and allow it to dry before covering with clothing. You can use the gel multiple times a day, depending on your provider's recommendation. - How to use silicone sheets: Cut the sheet to fit the size of your scar, and apply it directly to the healed scar. Wear it for 12-24 hours a day, and replace the sheet every few days as directed. - Benefits: Silicone helps reduce redness, flatten the scar, and improve its texture. Continued use over several months can lead to significant improvement in the appearance of the scar.  4. What to Expect: - Healing process: Scars generally take time to mature. The first few months may show redness or swelling, but this usually improves as healing progresses. - Long-term care: Scarring is a natural part of the healing process. While you cannot completely eliminate a scar, proper care can significantly improve its appearance over time. - Patience: It can take up to a year for a scar to fully mature, so it's important to be consistent with scar care and follow-up appointments with your provider.  5. When to Contact Your Healthcare Provider: - If you notice signs of infection (increased redness, warmth, drainage, or pain). - If your scar becomes unusually raised, itchy, or changes in color significantly. - If you have concerns about the appearance of your scar or experience unusual symptoms. - By following these guidelines, you can support your body's natural healing process and help ensure the best possible outcome for your scar. If you have any questions or concerns,  please don't hesitate to contact our office.     Important Information  Due to recent changes in healthcare laws, you may see results of your pathology and/or laboratory studies on MyChart before the doctors have had a chance to review them. We understand that in some cases there may be results that are confusing or concerning to you. Please  understand that not all results are received at the same time and often the doctors may need to interpret multiple results in order to provide you with the best plan of care or course of treatment. Therefore, we ask that you please give [place] 2 business days to thoroughly review all your results before contacting the office for clarification. Should we see a critical lab result, you will be contacted sooner.   If You Need Anything After Your Visit  If you have any questions or concerns for your doctor, please call our main line at [phone] If no one answers, please leave a voicemail as directed and we will return your call as soon as possible. Messages left after 4 pm will be answered the following business day.   You may also send [place] a message via MyChart. We typically respond to MyChart messages within 1-2 business days.  For prescription refills, please ask your pharmacy to contact our office. Our fax number is [phone].  If you have an urgent issue when the clinic is closed that cannot wait until the next business day, you can page your doctor at the number below.    Please note that while we do our best to be available for urgent issues outside of office hours, we are not available 24/7.   If you have an urgent issue and are unable to reach [place], you may choose to seek medical care at your doctor's office, retail clinic, urgent care center, or emergency room.  If you have a medical emergency, please immediately call 911 or go to the emergency department. In the event of inclement weather, please call our main line at [phone] for an update on the status of any delays or closures.  Dermatology Medication Tips: Please keep the boxes that topical medications come in in order to help keep track of the instructions about where and how to use these. Pharmacies typically print the medication instructions only on the boxes and not directly on the medication tubes.   If your medication is  too expensive, please contact our office at [phone] or send [place] a message through MyChart.   We are unable to tell what your co-pay for medications will be in advance as this is different depending on your insurance coverage. However, we may be able to find a substitute medication at lower cost or fill out paperwork to get insurance to cover a needed medication.   If a prior authorization is required to get your medication covered by your insurance company, please allow [place] 1-2 business days to complete this process.  Drug prices often vary depending on where the prescription is filled and some pharmacies may offer cheaper prices.  The website www.goodrx.com contains coupons for medications through different pharmacies. The prices here do not account for what the cost may be with help from insurance (it may be cheaper with your insurance), but the website can give you the Vien if you did not use any insurance.  - You can print the associated coupon and take it with your prescription to the pharmacy.  - You may also stop by our office during regular  business hours and pick up a GoodRx coupon card.  - If you need your prescription sent electronically to a different pharmacy, notify our office through [facility] or by phone at [phone]

## 2023-06-18 ENCOUNTER — Other Ambulatory Visit: Payer: Self-pay | Admitting: Family Medicine

## 2023-07-09 ENCOUNTER — Other Ambulatory Visit: Payer: Self-pay | Admitting: Family Medicine

## 2023-07-12 ENCOUNTER — Other Ambulatory Visit: Payer: Self-pay | Admitting: Family Medicine

## 2023-07-13 MED ORDER — ZEPBOUND 5 MG/0.5ML ~~LOC~~ SOLN: 5.0000 mg | SUBCUTANEOUS | 0 refills | Status: DC

## 2023-07-13 NOTE — Telephone Encounter (Signed)
 See note from patient: Patient comment: Have been using 5mg  and would like to get the higher dose of 7.5mg  for the next refils.     5 mg just sent a couple days ago.

## 2023-07-31 ENCOUNTER — Other Ambulatory Visit: Payer: Self-pay | Admitting: Family Medicine

## 2023-07-31 ENCOUNTER — Encounter: Payer: Self-pay | Admitting: Family Medicine

## 2023-07-31 DIAGNOSIS — E88819 Insulin resistance, unspecified: Secondary | ICD-10-CM

## 2023-07-31 MED ORDER — TIRZEPATIDE-WEIGHT MANAGEMENT 7.5 MG/0.5ML ~~LOC~~ SOLN: 7.5000 mg | SUBCUTANEOUS | 1 refills | Status: DC

## 2023-08-03 ENCOUNTER — Other Ambulatory Visit: Payer: Self-pay | Admitting: Family Medicine

## 2023-08-03 MED ORDER — TIRZEPATIDE 7.5 MG/0.5ML ~~LOC~~ SOAJ: 7.5000 mg | SUBCUTANEOUS | 2 refills | Status: DC

## 2023-08-17 ENCOUNTER — Encounter: Payer: Self-pay | Admitting: Family Medicine

## 2023-08-17 ENCOUNTER — Ambulatory Visit: Admitting: Family Medicine

## 2023-08-17 DIAGNOSIS — E782 Mixed hyperlipidemia: Secondary | ICD-10-CM

## 2023-08-17 DIAGNOSIS — F411 Generalized anxiety disorder: Secondary | ICD-10-CM

## 2023-08-17 DIAGNOSIS — E88819 Insulin resistance, unspecified: Secondary | ICD-10-CM

## 2023-08-17 DIAGNOSIS — I10 Essential (primary) hypertension: Secondary | ICD-10-CM

## 2023-08-17 DIAGNOSIS — M25562 Pain in left knee: Secondary | ICD-10-CM

## 2023-08-17 DIAGNOSIS — G8929 Other chronic pain: Secondary | ICD-10-CM

## 2023-08-17 DIAGNOSIS — E559 Vitamin D deficiency, unspecified: Secondary | ICD-10-CM

## 2023-08-17 LAB — COMPREHENSIVE METABOLIC PANEL WITH GFR
ALT: 13 U/L (ref 0–35)
AST: 14 U/L (ref 0–37)
Albumin: 4.5 g/dL (ref 3.5–5.2)
Alkaline Phosphatase: 58 U/L (ref 39–117)
BUN: 19 mg/dL (ref 6–23)
CO2: 30 meq/L (ref 19–32)
Calcium: 9.3 mg/dL (ref 8.4–10.5)
Chloride: 100 meq/L (ref 96–112)
Creatinine, Ser: 0.7 mg/dL (ref 0.40–1.20)
GFR: 84.01 mL/min (ref >60.00)
Glucose, Bld: 84 mg/dL (ref 70–99)
Potassium: 4.4 meq/L (ref 3.5–5.1)
Sodium: 138 meq/L (ref 135–145)
Total Bilirubin: 0.4 mg/dL (ref 0.2–1.2)
Total Protein: 6.9 g/dL (ref 6.0–8.3)

## 2023-08-17 LAB — CBC WITH DIFFERENTIAL/PLATELET
Basophils Absolute: 0 10*3/uL (ref 0.0–0.1)
Basophils Relative: 0.9 % (ref 0.0–3.0)
Eosinophils Absolute: 0.1 10*3/uL (ref 0.0–0.7)
Eosinophils Relative: 1.5 % (ref 0.0–5.0)
HCT: 39 % (ref 36.0–46.0)
Hemoglobin: 13.3 g/dL (ref 12.0–15.0)
Lymphocytes Relative: 16 % (ref 12.0–46.0)
Lymphs Abs: 0.8 10*3/uL (ref 0.7–4.0)
MCHC: 34 g/dL (ref 30.0–36.0)
MCV: 88.1 fl (ref 78.0–100.0)
Monocytes Absolute: 0.4 10*3/uL (ref 0.1–1.0)
Monocytes Relative: 7 % (ref 3.0–12.0)
Neutro Abs: 3.9 10*3/uL (ref 1.4–7.7)
Neutrophils Relative %: 74.6 % (ref 43.0–77.0)
Platelets: 221 10*3/uL (ref 150.0–400.0)
RBC: 4.43 Mil/uL (ref 3.87–5.11)
RDW: 14.9 % (ref 11.5–15.5)
WBC: 5.3 10*3/uL (ref 4.0–10.5)

## 2023-08-17 LAB — LIPID PANEL
Cholesterol: 176 mg/dL (ref 0–200)
HDL: 59.4 mg/dL (ref >39.00)
LDL Cholesterol: 97 mg/dL (ref 0–99)
NonHDL: 116.25
Total CHOL/HDL Ratio: 3
Triglycerides: 94 mg/dL (ref 0.0–149.0)
VLDL: 18.8 mg/dL (ref 0.0–40.0)

## 2023-08-17 MED ORDER — VITAMIN D (ERGOCALCIFEROL) 1.25 MG (50000 UNIT) PO CAPS: 50000.0000 [IU] | ORAL_CAPSULE | ORAL | 1 refills | Status: AC

## 2023-08-17 MED ORDER — TIRZEPATIDE 7.5 MG/0.5ML ~~LOC~~ SOAJ: SUBCUTANEOUS | 2 refills | Status: DC

## 2023-08-17 MED ORDER — TIRZEPATIDE-WEIGHT MANAGEMENT 7.5 MG/0.5ML ~~LOC~~ SOLN: 7.5000 mg | SUBCUTANEOUS | 0 refills | Status: DC

## 2023-08-17 MED ORDER — LORAZEPAM 0.5 MG PO TABS: 0.5000 mg | ORAL_TABLET | Freq: Three times a day (TID) | ORAL | 1 refills | Status: DC | PRN

## 2023-08-17 MED ORDER — TIRZEPATIDE-WEIGHT MANAGEMENT 7.5 MG/0.5ML ~~LOC~~ SOLN: 7.5000 mg | SUBCUTANEOUS | 1 refills | Status: DC

## 2023-08-17 MED ORDER — SERTRALINE HCL 100 MG PO TABS: ORAL_TABLET | ORAL | 3 refills | Status: DC

## 2023-08-17 MED ORDER — ATORVASTATIN CALCIUM 20 MG PO TABS: 20.0000 mg | ORAL_TABLET | Freq: Every day | ORAL | 1 refills | Status: DC

## 2023-08-18 DIAGNOSIS — E88819 Insulin resistance, unspecified: Secondary | ICD-10-CM | POA: Insufficient documentation

## 2023-08-18 DIAGNOSIS — G8929 Other chronic pain: Secondary | ICD-10-CM | POA: Insufficient documentation

## 2023-08-18 NOTE — Progress Notes (Signed)
 Established Patient Office Visit  Subjective   Patient ID: Allison Cox, female    DOB: 1945/11/24  Age: 78 y.o. MRN: [id number]  Chief Complaint  Patient presents with   Hyperlipidemia   Follow-up    HPI Discussed the use of AI scribe software for clinical note transcription with the patient, who gave verbal consent to proceed.  History of Present Illness Allison Cox "[name]" is a 78 year old female who presents for lab work for prescription refill.  She is experiencing issues with her prescription refill for Zepan due to missing information. She is currently on a 7.5 mg dose and has not run out of medication yet, as she is usually two weeks ahead of time. She is also taking vitamin D, Zoloft, lorazepam, and atorvastatin. She has not been using her muscle relaxer frequently, having only taken a couple since the last refill.  She reports swelling and tightness in her left knee, which was operated on nearly two years ago. The tightness increases as the day progresses, although there is no recent injury to the knee. She has previously had fluid removed from the knee, with 50 cc, 25 cc, and another 25 cc extracted in the past. The tightness makes it difficult for her to bend the knee. No swelling in the legs apart from the knee issue.  She had a spot of skin cancer, identified as either basal cell or squamous cell carcinoma, but not melanoma. The lesion was removed in one procedure, and subsequent body scans revealed a few small areas that were treated with freezing. She is relieved that the cancer was addressed promptly.  Despite the prescription issues, she reports a weight loss of approximately 20 pounds, depending on the scale used.   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   LOW BACK PAIN, CHRONIC 08/11/2009   History of colonic polyps 08/17/2008   Acute upper respiratory infection 04/17/2008   SINUSITIS- ACUTE-NOS 03/30/2008   Hyperlipidemia 09/26/2007   Anxiety state 01/01/2007   CYST, SEBACEOUS 01/01/2007   Disorder of bone and cartilage 01/01/2007   ARTHROSCOPY, RIGHT KNEE, HX OF 01/01/2007   Past Medical History:  Diagnosis Date   Anxiety    Arthritis    knee   Colon polyps    hyperplastic and Adenomatous   Hyperlipidemia    Osteopenia    Squamous cell carcinoma of skin    Past Surgical History:  Procedure Laterality Date   CARPAL TUNNEL RELEASE Left    COLONOSCOPY  07/2012   Marina Goodell - polyps   KNEE ARTHROSCOPY Right    TOTAL KNEE ARTHROPLASTY Left 09/29/2021   Procedure: LEFT TOTAL KNEE ARTHROPLASTY;  Surgeon: Cammy Copa, MD;  Location: [facility] OR;  Service: Orthopedics;  Laterality: Left;   WISDOM TOOTH EXTRACTION     Social History   Tobacco Use   Smoking status: Former    Current packs/day: 0.00    Types: Cigarettes  Quit date: 05/15/1986    Years since quitting: 37.2   Smokeless tobacco: Never  Vaping Use   Vaping status: Never Used  Substance Use Topics   Alcohol use: Yes    Alcohol/week: 3.0 - 4.0 standard drinks of alcohol    Types: 3 - 4 Glasses of wine per week   Drug use: No   Social History   Socioeconomic History   Marital status: Single    Spouse name: Not on file   Number of children: 0   Years of education: Not on file   Highest education level: 12th grade  Occupational History   Not on file  Tobacco Use   Smoking status: Former    Current  packs/day: 0.00    Types: Cigarettes    Quit date: 05/15/1986    Years since quitting: 37.2   Smokeless tobacco: Never  Vaping Use   Vaping status: Never Used  Substance and Sexual Activity   Alcohol use: Yes    Alcohol/week: 3.0 - 4.0 standard drinks of alcohol    Types: 3 - 4 Glasses of wine per week   Drug use: No   Sexual activity: Yes    Partners: Male    Birth control/protection: Post-menopausal  Other Topics Concern   Not on file  Social History Narrative   Not on file   Social Drivers of Health   Financial Resource Strain: Not on file  Food Insecurity: No Food Insecurity (08/16/2023)   Hunger Vital Sign    Worried About Running Out of Food in the Last Year: Never true    Ran Out of Food in the Last Year: Never true  Transportation Needs: No Transportation Needs (08/16/2023)   PRAPARE - Administrator, Civil Service (Medical): No    Lack of Transportation (Non-Medical): No  Physical Activity: Insufficiently Active (08/16/2023)   Exercise Vital Sign    Days of Exercise per Week: 1 day    Minutes of Exercise per Session: 30 min  Stress: No Stress Concern Present (08/16/2023)   [employer] of Occupational Health - Occupational Stress Questionnaire    Feeling of Stress : Not at all  Social Connections: Moderately Isolated (08/16/2023)   Social Connection and Isolation Panel [NHANES]    Frequency of Communication with Friends and Family: More than three times a week    Frequency of Social Gatherings with Friends and Family: More than three times a week    Attends Religious Services: 1 to 4 times per year    Active Member of [employer] or Organizations: No    Attends Engineer, structural: Not on file    Marital Status: Divorced  Catering manager Violence: Not on file   Family Status  Relation Name Status   Mother  Deceased at age 53   Father  Deceased   Other  (Not Specified)   Neg Hx  (Not Specified)  No partnership data on file   Family History   Problem Relation Age of Onset   Stroke Mother    Heart attack Father    Coronary artery disease Other    Colon cancer Neg Hx    Rectal cancer Neg Hx    Stomach cancer Neg Hx    Colon polyps Neg Hx    Esophageal cancer Neg Hx    Allergies  Allergen Reactions   Codeine Nausea And Vomiting    SEVERE NAUSEA AND VOMITING-- do not give at all Hypotension severe      ROS  Objective:     BP 110/70 (BP Location: Left Arm, Patient Position: Sitting, Cuff Size: Large)   Pulse 66   Temp 98 F (36.7 C) (Oral)   Resp 18   Ht 5\' 7"  (1.702 m)   Wt 226 lb 9.6 oz (102.8 kg)   SpO2 96%   BMI 35.49 kg/m  BP Readings from Last 3 Encounters:  08/17/23 110/70  05/02/23 123/71  04/19/23 115/70   Wt Readings from Last 3 Encounters:  08/17/23 226 lb 9.6 oz (102.8 kg)  03/01/23 246 lb 9.6 oz (111.9 kg)  12/21/22 240 lb (108.9 kg)   SpO2 Readings from Last 3 Encounters:  08/17/23 96%  03/01/23 98%  12/21/22 99%      Physical Exam   Results for orders placed or performed in visit on 08/17/23  CBC with Differential/Platelet  Result Value Ref Range   WBC 5.3 4.0 - 10.5 K/uL   RBC 4.43 3.87 - 5.11 Mil/uL   Hemoglobin 13.3 12.0 - 15.0 g/dL   HCT 45.4 09.8 - 11.9 %   MCV 88.1 78.0 - 100.0 fl   MCHC 34.0 30.0 - 36.0 g/dL   RDW 14.7 82.9 - 56.2 %   Platelets 221.0 150.0 - 400.0 K/uL   Neutrophils Relative % 74.6 43.0 - 77.0 %   Lymphocytes Relative 16.0 12.0 - 46.0 %   Monocytes Relative 7.0 3.0 - 12.0 %   Eosinophils Relative 1.5 0.0 - 5.0 %   Basophils Relative 0.9 0.0 - 3.0 %   Neutro Abs 3.9 1.4 - 7.7 K/uL   Lymphs Abs 0.8 0.7 - 4.0 K/uL   Monocytes Absolute 0.4 0.1 - 1.0 K/uL   Eosinophils Absolute 0.1 0.0 - 0.7 K/uL   Basophils Absolute 0.0 0.0 - 0.1 K/uL  Comprehensive metabolic panel with GFR  Result Value Ref Range   Sodium 138 135 - 145 mEq/L   Potassium 4.4 3.5 - 5.1 mEq/L   Chloride 100 96 - 112 mEq/L   CO2 30 19 - 32 mEq/L   Glucose, Bld 84 70 - 99 mg/dL    BUN 19 6 - 23 mg/dL   Creatinine, Ser 1.30 0.40 - 1.20 mg/dL   Total Bilirubin 0.4 0.2 - 1.2 mg/dL   Alkaline Phosphatase 58 39 - 117 U/L   AST 14 0 - 37 U/L   ALT 13 0 - 35 U/L   Total Protein 6.9 6.0 - 8.3 g/dL   Albumin 4.5 3.5 - 5.2 g/dL   GFR 86.57 >84.69 mL/min   Calcium 9.3 8.4 - 10.5 mg/dL  Lipid panel  Result Value Ref Range   Cholesterol 176 0 - 200 mg/dL   Triglycerides 62.9 0.0 - 149.0 mg/dL   HDL 52.84 >13.24 mg/dL   VLDL 40.1 0.0 - 02.7 mg/dL   LDL Cholesterol 97 0 - 99 mg/dL   Total CHOL/HDL Ratio 3    NonHDL 116.25     Last CBC Lab Results  Component Value Date   WBC 5.3 08/17/2023   HGB 13.3 08/17/2023   HCT 39.0 08/17/2023   MCV 88.1 08/17/2023   MCH 30.4 09/07/2022   RDW 14.9 08/17/2023   PLT 221.0 08/17/2023   Last metabolic panel Lab Results  Component Value Date   GLUCOSE 84 08/17/2023   NA 138 08/17/2023   K 4.4 08/17/2023   CL 100 08/17/2023   CO2 30 08/17/2023   BUN 19 08/17/2023   CREATININE 0.70 08/17/2023   GFR 84.01 08/17/2023   CALCIUM 9.3 08/17/2023  PROT 6.9 08/17/2023   ALBUMIN 4.5 08/17/2023   BILITOT 0.4 08/17/2023   ALKPHOS 58 08/17/2023   AST 14 08/17/2023   ALT 13 08/17/2023   ANIONGAP 5 09/21/2021   Last lipids Lab Results  Component Value Date   CHOL 176 08/17/2023   HDL 59.40 08/17/2023   LDLCALC 97 08/17/2023   LDLDIRECT 125.1 10/06/2011   TRIG 94.0 08/17/2023   CHOLHDL 3 08/17/2023   Last hemoglobin A1c No results found for: "HGBA1C" Last thyroid functions Lab Results  Component Value Date   TSH 4.12 03/01/2023   T4TOTAL 5.3 07/05/2021   Last vitamin D Lab Results  Component Value Date   VD25OH 30.66 03/01/2023   Last vitamin B12 and Folate Lab Results  Component Value Date   VITAMINB12 265 07/05/2021      The 10-year ASCVD risk score (Arnett DK, et al., 2019) is: 13.5%    Assessment & Plan:   Problem List Items Addressed This Visit       Unprioritized   Generalized anxiety  disorder   Relevant Medications   LORazepam (ATIVAN) 0.5 MG tablet   sertraline (ZOLOFT) 100 MG tablet   Vitamin D deficiency   Relevant Medications   Vitamin D, Ergocalciferol, (DRISDOL) 1.25 MG (50000 UNIT) CAPS capsule   Hyperlipidemia   Relevant Medications   atorvastatin (LIPITOR) 20 MG tablet   Other Relevant Orders   CBC with Differential/Platelet (Completed)   Comprehensive metabolic panel with GFR (Completed)   Lipid panel (Completed)   Primary hypertension   Relevant Medications   atorvastatin (LIPITOR) 20 MG tablet   Other Visit Diagnoses       Morbid obesity (HCC)    -  Primary   Relevant Medications   tirzepatide 7.5 MG/0.5ML injection vial   Other Relevant Orders   CBC with Differential/Platelet (Completed)   Comprehensive metabolic panel with GFR (Completed)   Lipid panel (Completed)     Insulin resistance       Relevant Medications   tirzepatide 7.5 MG/0.5ML injection vial   Other Relevant Orders   CBC with Differential/Platelet (Completed)   Comprehensive metabolic panel with GFR (Completed)   Lipid panel (Completed)     Chronic pain of left knee       Relevant Medications   sertraline (ZOLOFT) 100 MG tablet   Other Relevant Orders   Ambulatory referral to Orthopedic Surgery     Assessment and Plan Assessment & Plan Knee Swelling   She experiences tightness and swelling in the left knee, which underwent surgery two years ago. The tightness worsens throughout the day. No recent injury is reported, but past fluid removal suggests recurrent effusion. Refer to an orthopedic specialist for evaluation.  Obesity   She is on Mounjaro (tirzepatide) for weight management and has lost about 20 pounds. A prescription refill issue occurred due to an incorrect form (pen instead of vial), but she is not short on medication, being two weeks ahead. Ensure the correct form of Mounjaro (vial) is prescribed. Monitor weight and medication adherence.  Medication Management    She takes multiple medications, including vitamin D, Zoloft, lorazepam, and atorvastatin, and has chosen to skip the muscle relaxer as it is rarely used. Refill vitamin D, Zoloft, lorazepam, and atorvastatin prescriptions. Discontinue the muscle relaxer unless needed.  Skin Cancer   She had a spot of skin cancer, identified as either basal cell or squamous cell carcinoma, not melanoma. The lesion was excised successfully in one procedure, and additional small lesions were treated  with cryotherapy. She is relieved it was not melanoma, as she knows people who died from it. Continue regular dermatological surveillance for new or suspicious lesions.    No follow-ups on file.    [name], DO

## 2023-08-18 NOTE — Patient Instructions (Signed)

## 2023-08-20 NOTE — Telephone Encounter (Signed)
 I already resent the prescription with the updated quantity and the dx code.

## 2023-08-26 ENCOUNTER — Encounter: Payer: Self-pay | Admitting: Family Medicine

## 2023-08-27 ENCOUNTER — Ambulatory Visit: Admitting: Surgical

## 2023-08-27 ENCOUNTER — Encounter: Payer: Self-pay | Admitting: Surgical

## 2023-08-27 ENCOUNTER — Telehealth: Payer: Self-pay | Admitting: Radiology

## 2023-08-27 ENCOUNTER — Other Ambulatory Visit (INDEPENDENT_AMBULATORY_CARE_PROVIDER_SITE_OTHER): Payer: Self-pay

## 2023-08-27 DIAGNOSIS — M25562 Pain in left knee: Secondary | ICD-10-CM | POA: Diagnosis not present

## 2023-08-27 NOTE — Progress Notes (Signed)
 Office Visit Note   Patient: Allison Cox           Date of Birth: 07/11/1945           MRN: [id number] Visit Date: 08/27/2023 Requested by: [address], [place], [place] [address],  [place] [address] PCP: Zola Button, Grayling Congress, DO  Subjective: Chief Complaint  Patient presents with   Left Knee - Pain    09/29/2021  left TKA    HPI: Allison Cox is a 78 y.o. female who presents to the office reporting left knee pain.  Patient states she has history of left knee replacement by Dr. August Saucer on 09/29/2021.  She initially did pretty well after surgery and has been living with minimal knee discomfort up until the last few months when she started having increased pain and stiffness with a feeling of "discomfort" diffusely through the anterior aspect of the knee.  She had no history of actual injury event.  Over the last few weeks in particular, her knee discomfort has progressed.  She feels like her leg feels "heavy".  No groin pain, radicular again, new numbness or tingling.  No weakness.  Leg is not buckling on her.  No waking with pain.  No significant swelling that she has noticed no fevers or chills.  She saw her PCP last week.  She takes vitamin D weekly (50,000 units).  She had vitamin D checked late 2024 demonstrating level of 30.66..                ROS: All systems reviewed are negative as they relate to the chief complaint within the history of present illness.  Patient denies fevers or chills.  Assessment & Plan: Visit Diagnoses:  1. Acute pain of left knee     Plan: Impression is 78 year old female with increasing discomfort in the left knee.  Radiographs taken today demonstrate proximal pole patellar fracture with some displacement and lucency between the fragment and the main body of the patella.  There is no evidence of significant compromise of the patellar component.  No abnormality noted to the femoral or tibial components.  With the lack of a specific  injury event and the slightly worsening pain over the last 2 months, need CT scan to evaluate any osseous union that has or is occurring at the fracture site.  She is already on vitamin D supplementation.  We will get her set up for a bone stimulator to limit the risk of nonunion since if this fracture continues to displace, this will likely result in severe disability and weakness for her left leg.  Overall her strength is a lot better than expected based on her radiographs.  She will avoid any heavy physical activity and avoid any knee flexion past 90 degrees.  Plan for next steps based on CT scan results.  I will call her with these results and we can discuss further.  Follow-Up Instructions: No follow-ups on file.   Orders:  Orders Placed This Encounter  Procedures   XR Knee 1-2 Views Left   No orders of the defined types were placed in this encounter.     Procedures: No procedures performed   Clinical Data: No additional findings.  Objective: Vital Signs: There were no vitals taken for this visit.  Physical Exam:  Constitutional: Patient appears well-developed HEENT:  Head: Normocephalic Eyes:EOM are normal Neck: Normal range of motion Cardiovascular: Normal rate Pulmonary/chest: Effort normal Neurologic: Patient is  alert Skin: Skin is warm Psychiatric: Patient has normal mood and affect  Ortho Exam: Ortho exam demonstrates left knee without effusion.  No significant tenderness throughout the proximal tibia, distal femur, patella.  She has quadricep strength rated 5/5 with quad firing and quad tendon palpable and intact.  There is some irregularity of the proximal aspect of the patella though this is not really tender for her aside from some minimal soreness with palpation.  She is able to perform straight leg raise without extensor lag.  She has range of motion from 0 degrees extension passively to about 120 degrees of knee flexion.  Does have some mild discomfort with  terminal knee flexion.  No pain with hip range of motion.  Stable to varus and valgus stress at 0 and 30 degrees.  No gross mid flexion instability.  Incision is well-healed from prior total knee arthroplasty.  No cellulitis or skin changes.  No sinus tract.  Specialty Comments:  No specialty comments available.  Imaging: No results found.   PMFS History: Patient Active Problem List   Diagnosis Date Noted   Morbid obesity (HCC) 08/18/2023   Insulin resistance 08/18/2023   Chronic pain of left knee 08/18/2023   Cervical strain 09/07/2022   Vitamin D deficiency 03/16/2022   Arthritis of left knee    S/P total knee arthroplasty, left 09/29/2021   Preop examination 09/22/2021   Chills (without fever) 07/05/2021   Other fatigue 07/05/2021   Preventative health care 10/21/2020   Primary hypertension 10/21/2020   Estrogen deficiency 10/21/2020   Right hip pain 04/27/2020   Generalized anxiety disorder 08/10/2017   Seasonal allergies 03/08/2017   Abdominal pain 02/19/2014   Obesity (BMI 30-39.9) 10/02/2013   INCONTINENCE, 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 08/11/2009   LOW BACK PAIN, CHRONIC 08/11/2009   History of colonic polyps 08/17/2008   Acute upper respiratory infection 04/17/2008   SINUSITIS- ACUTE-NOS 03/30/2008   Hyperlipidemia 09/26/2007   Anxiety state 01/01/2007   CYST, SEBACEOUS 01/01/2007   Disorder of bone and cartilage 01/01/2007   ARTHROSCOPY, RIGHT KNEE, HX OF 01/01/2007   Past Medical History:  Diagnosis Date   Anxiety    Arthritis    knee   Colon polyps    hyperplastic and Adenomatous   Hyperlipidemia    Osteopenia    Squamous cell carcinoma of skin     Family History  Problem Relation Age of Onset   Stroke Mother    Heart attack Father    Coronary artery disease Other    Colon cancer Neg Hx    Rectal cancer Neg Hx    Stomach cancer Neg Hx    Colon polyps Neg Hx    Esophageal cancer Neg Hx     Past Surgical History:  Procedure Laterality Date   CARPAL TUNNEL  RELEASE Left    COLONOSCOPY  07/2012   Marina Goodell - polyps   KNEE ARTHROSCOPY Right    TOTAL KNEE ARTHROPLASTY Left 09/29/2021   Procedure: LEFT TOTAL KNEE ARTHROPLASTY;  Surgeon: Cammy Copa, MD;  Location: [facility] OR;  Service: Orthopedics;  Laterality: Left;   WISDOM TOOTH EXTRACTION     Social History   Occupational History   Not on file  Tobacco Use   Smoking status: Former    Current packs/day: 0.00    Types: Cigarettes    Quit date: 05/15/1986    Years since quitting: 37.3   Smokeless tobacco: Never  Vaping Use   Vaping status: Never Used  Substance and Sexual Activity  Alcohol use: Yes    Alcohol/week: 3.0 - 4.0 standard drinks of alcohol    Types: 3 - 4 Glasses of wine per week   Drug use: No   Sexual activity: Yes    Partners: Male    Birth control/protection: Post-menopausal

## 2023-08-27 NOTE — Telephone Encounter (Signed)
 Please reach out to [facility] in regards to patient needing bone stim for periprosthetic fracture of left patella. Thanks.

## 2023-08-28 ENCOUNTER — Encounter: Payer: Self-pay | Admitting: Surgical

## 2023-08-28 ENCOUNTER — Ambulatory Visit
Admission: RE | Admit: 2023-08-28 | Discharge: 2023-08-28 | Disposition: A | Discharge status: Home / Self Care | Source: Ambulatory Visit | Attending: Surgical

## 2023-08-28 DIAGNOSIS — M25462 Effusion, left knee: Secondary | ICD-10-CM | POA: Diagnosis not present

## 2023-08-28 DIAGNOSIS — R936 Abnormal findings on diagnostic imaging of limbs: Secondary | ICD-10-CM | POA: Diagnosis not present

## 2023-08-28 DIAGNOSIS — M25562 Pain in left knee: Secondary | ICD-10-CM | POA: Diagnosis not present

## 2023-08-28 DIAGNOSIS — Z96652 Presence of left artificial knee joint: Secondary | ICD-10-CM | POA: Diagnosis not present

## 2023-08-28 NOTE — Telephone Encounter (Signed)
 Emailed Clayborne Cunning and printed all information for him to pick up from office.

## 2023-09-05 ENCOUNTER — Other Ambulatory Visit: Payer: Self-pay | Admitting: Family Medicine

## 2023-09-05 DIAGNOSIS — E88819 Insulin resistance, unspecified: Secondary | ICD-10-CM

## 2023-09-11 NOTE — Telephone Encounter (Signed)
 Hey can we get an appointment for Gay to see me or Dr Rozelle Corning in ~2 weeks?

## 2023-09-26 ENCOUNTER — Other Ambulatory Visit: Payer: Self-pay | Admitting: Family Medicine

## 2023-09-26 DIAGNOSIS — E88819 Insulin resistance, unspecified: Secondary | ICD-10-CM

## 2023-11-07 NOTE — Progress Notes (Signed)
 [facility] Quality Team Note  Name: LINDEE LEASON Date of Birth: August 12, 1945 MRN: [id number] Date: 11/07/2023  [facility] Quality Team has reviewed this patient's chart, please see recommendations below:  [facility] Quality Other; (CHART REVIEWED FOR CONTROLLING BLOOD PRESSURE. ABSTRACTED MOST RECENT BP IN APRIL 2025. PATIENT WILL NEED ANOTHER QUALIFYING BLOOD PRESSURE BEFORE THE END OF 2025)

## 2023-11-26 ENCOUNTER — Encounter: Payer: Self-pay | Admitting: Family Medicine

## 2023-11-26 DIAGNOSIS — E88819 Insulin resistance, unspecified: Secondary | ICD-10-CM

## 2023-11-27 ENCOUNTER — Ambulatory Visit: Payer: Medicare Other | Admitting: Dermatology

## 2023-11-27 ENCOUNTER — Other Ambulatory Visit: Payer: Self-pay | Admitting: Family

## 2023-11-27 ENCOUNTER — Encounter: Payer: Self-pay | Admitting: Dermatology

## 2023-11-27 VITALS — BP 134/82 | HR 68

## 2023-11-27 DIAGNOSIS — L814 Other melanin hyperpigmentation: Secondary | ICD-10-CM

## 2023-11-27 DIAGNOSIS — Z1283 Encounter for screening for malignant neoplasm of skin: Secondary | ICD-10-CM | POA: Diagnosis not present

## 2023-11-27 DIAGNOSIS — D1801 Hemangioma of skin and subcutaneous tissue: Secondary | ICD-10-CM

## 2023-11-27 DIAGNOSIS — L570 Actinic keratosis: Secondary | ICD-10-CM | POA: Diagnosis not present

## 2023-11-27 DIAGNOSIS — D229 Melanocytic nevi, unspecified: Secondary | ICD-10-CM

## 2023-11-27 DIAGNOSIS — L821 Other seborrheic keratosis: Secondary | ICD-10-CM | POA: Diagnosis not present

## 2023-11-27 DIAGNOSIS — L820 Inflamed seborrheic keratosis: Secondary | ICD-10-CM | POA: Diagnosis not present

## 2023-11-27 DIAGNOSIS — L578 Other skin changes due to chronic exposure to nonionizing radiation: Secondary | ICD-10-CM

## 2023-11-27 DIAGNOSIS — Z85828 Personal history of other malignant neoplasm of skin: Secondary | ICD-10-CM

## 2023-11-27 DIAGNOSIS — W908XXA Exposure to other nonionizing radiation, initial encounter: Secondary | ICD-10-CM

## 2023-11-27 MED ORDER — TIRZEPATIDE-WEIGHT MANAGEMENT 10 MG/0.5ML ~~LOC~~ SOLN: 10.0000 mg | SUBCUTANEOUS | 1 refills | Status: DC

## 2023-11-27 NOTE — Progress Notes (Signed)
 Total Body Skin Exam (TBSE) Visit   Subjective  Allison Cox is a 78 y.o. female who presents for the following: Skin Cancer Screening and Full Body Skin Exam  Patient presents today for follow up visit for TBSE. Patient was last evaluated on 05/30/23. Patient denies medication changes. Patient reports she does have spots, moles and lesions of concern to be evaluated (right Shoulder). Patient reports throughout her lifetime she has had moderate sun exposure. Currently, patient reports if she has excessive sun exposure, she does not apply sunscreen and/or wears protective coverings. Patient reports she has hx of bx (Hx of SCC). Patient denies  family history of skin cancers. The patient has spots, moles and lesions to be evaluated, some may be new or changing and the patient has concerns that these could be cancer.  The following portions of the chart were reviewed this encounter and updated as appropriate: medications, allergies, medical history  Review of Systems:  No other skin or systemic complaints except as noted in HPI or Assessment and Plan.  Objective  Well appearing patient in no apparent distress; mood and affect are within normal limits.  A full examination was performed including scalp, head, eyes, ears, nose, lips, neck, chest, axillae, abdomen, back, buttocks, bilateral upper extremities, bilateral lower extremities, hands, feet, fingers, toes, fingernails, and toenails. All findings within normal limits unless otherwise noted below.   Relevant physical exam findings are noted in the Assessment and Plan.  Mid Supratip of Nose, Right Shoulder - Anterior Erythematous thin papules/macules with gritty scale.  Left Popliteal Fossa, Right Popliteal Fossa seborrheic keratosis   Assessment & Plan   LENTIGINES, SEBORRHEIC KERATOSES, HEMANGIOMAS - Benign normal skin lesions - Benign-appearing - Call for any changes  MELANOCYTIC NEVI - Tan-brown and/or pink-flesh-colored  symmetric macules and papules - Benign appearing on exam today - Observation - Call clinic for new or changing moles - Recommend daily use of broad spectrum spf 30+ sunscreen to sun-exposed areas.   MILD ACTINIC DAMAGE - Chronic condition, secondary to cumulative UV/sun exposure - diffuse scaly erythematous macules with underlying dyspigmentation - Recommend daily broad spectrum sunscreen SPF 30+ to sun-exposed areas, reapply every 2 hours as needed.  - Staying in the shade or wearing long sleeves, sun glasses (UVA+UVB protection) and wide brim hats (4-inch brim around the entire circumference of the hat) are also recommended for sun protection.  - Call for new or changing lesions.  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  - Call if any new or changing lesions are noted between office visits  ACTINIC KERATOSIS Exam: Erythematous thin papules/macules with gritty scale at the Nose, Right Shoulder  Actinic keratoses are precancerous spots that appear secondary to cumulative UV radiation exposure/sun exposure over time. They are chronic with expected duration over 1 year. A portion of actinic keratoses will progress to squamous cell carcinoma of the skin. It is not possible to reliably predict which spots will progress to skin cancer and so treatment is recommended to prevent development of skin cancer.  Recommend daily broad spectrum sunscreen SPF 30+ to sun-exposed areas, reapply every 2 hours as needed.  Recommend staying in the shade or wearing long sleeves, sun glasses (UVA+UVB protection) and wide brim hats (4-inch brim around the entire  circumference of the hat). Call for new or changing lesions.  Treatment Plan: - Cryo Therapy Completed while  in office  SKIN CANCER SCREENING PERFORMED TODAY.  AK (ACTINIC KERATOSIS) (2) Mid Supratip of Nose, Right Shoulder - Anterior Destruction of lesion - Mid Supratip of Nose, Right Shoulder - Anterior Complexity: simple   Destruction method: cryotherapy   Informed consent: discussed and consent obtained   Timeout:  patient name, date of birth, surgical site, and procedure verified Lesion destroyed using liquid nitrogen: Yes   Cryotherapy cycles:  2 Outcome: patient tolerated procedure well with no complications   Post-procedure details: wound care instructions given    INFLAMED SEBORRHEIC KERATOSIS (2) Left Popliteal Fossa, Right Popliteal Fossa Destruction of lesion - Left Popliteal Fossa, Right Popliteal Fossa Complexity: simple   Destruction method: cryotherapy   Informed consent: discussed and consent obtained   Timeout:  patient name, date of birth, surgical site, and procedure verified Lesion destroyed using liquid nitrogen: Yes   Cryotherapy cycles:  4 Post-procedure details: wound care instructions given     Return in about 6 months (around 05/29/2024) for TBSE with Dr. Corey.  I, Maclane Holloran, am acting as Neurosurgeon for [employer], DO.  Documentation: I have reviewed the above documentation for accuracy and completeness, and I agree with the above.  Delon Lenis, DO

## 2023-11-27 NOTE — Patient Instructions (Signed)

## 2023-12-17 ENCOUNTER — Other Ambulatory Visit: Payer: Self-pay | Admitting: Family

## 2023-12-17 MED ORDER — ZEPBOUND 12.5 MG/0.5ML ~~LOC~~ SOAJ: 12.5000 mg | SUBCUTANEOUS | 1 refills | Status: DC

## 2023-12-19 MED ORDER — ZEPBOUND 12.5 MG/0.5ML ~~LOC~~ SOAJ: 12.5000 mg | SUBCUTANEOUS | 1 refills | Status: DC

## 2023-12-19 NOTE — Telephone Encounter (Signed)
 Updated rx sent

## 2024-01-03 ENCOUNTER — Other Ambulatory Visit: Payer: Self-pay | Admitting: Family

## 2024-01-15 ENCOUNTER — Encounter: Payer: Self-pay | Admitting: Family Medicine

## 2024-01-15 DIAGNOSIS — E88819 Insulin resistance, unspecified: Secondary | ICD-10-CM

## 2024-01-16 MED ORDER — ZEPBOUND 12.5 MG/0.5ML ~~LOC~~ SOAJ: 12.5000 mg | SUBCUTANEOUS | 1 refills | Status: DC

## 2024-01-18 MED ORDER — ZEPBOUND 12.5 MG/0.5ML ~~LOC~~ SOAJ: 12.5000 mg | SUBCUTANEOUS | 1 refills | Status: DC

## 2024-01-18 NOTE — Addendum Note (Signed)
 Addended by: Jep Dyas D on: 01/18/2024 01:07 PM   Modules accepted: Orders

## 2024-01-22 ENCOUNTER — Telehealth: Payer: Self-pay | Admitting: Family Medicine

## 2024-01-22 NOTE — Telephone Encounter (Signed)
 Copied from CRM [id number]. Topic: Medicare AWV >> Jan 22, 2024  2:54 PM Nathanel DEL wrote: Reason for CRM: Called LVM 01/22/2024 to schedule AWV. Please schedule Virtual or Telehealth visits ONLY.   Nathanel Paschal; Care Guide Ambulatory Clinical Support [place] [facility] Direct Dial: [phone]

## 2024-01-31 ENCOUNTER — Ambulatory Visit (INDEPENDENT_AMBULATORY_CARE_PROVIDER_SITE_OTHER): Admitting: Family Medicine

## 2024-01-31 ENCOUNTER — Encounter: Payer: Self-pay | Admitting: Family Medicine

## 2024-01-31 VITALS — BP 104/78 | HR 69 | Temp 97.6°F | Resp 18 | Ht 67.0 in | Wt 203.2 lb

## 2024-01-31 DIAGNOSIS — E782 Mixed hyperlipidemia: Secondary | ICD-10-CM

## 2024-01-31 DIAGNOSIS — E2839 Other primary ovarian failure: Secondary | ICD-10-CM | POA: Diagnosis not present

## 2024-01-31 DIAGNOSIS — Z23 Encounter for immunization: Secondary | ICD-10-CM | POA: Diagnosis not present

## 2024-01-31 DIAGNOSIS — F411 Generalized anxiety disorder: Secondary | ICD-10-CM

## 2024-01-31 LAB — LIPID PANEL
Cholesterol: 178 mg/dL (ref 0–200)
HDL: 59.6 mg/dL (ref >39.00)
LDL Cholesterol: 102 mg/dL — ABNORMAL HIGH (ref 0–99)
NonHDL: 118.2
Total CHOL/HDL Ratio: 3
Triglycerides: 82 mg/dL (ref 0.0–149.0)
VLDL: 16.4 mg/dL (ref 0.0–40.0)

## 2024-01-31 LAB — CBC WITH DIFFERENTIAL/PLATELET
Basophils Absolute: 0 K/uL (ref 0.0–0.1)
Basophils Relative: 0.8 % (ref 0.0–3.0)
Eosinophils Absolute: 0.1 K/uL (ref 0.0–0.7)
Eosinophils Relative: 1.4 % (ref 0.0–5.0)
HCT: 40.5 % (ref 36.0–46.0)
Hemoglobin: 13.6 g/dL (ref 12.0–15.0)
Lymphocytes Relative: 15.7 % (ref 12.0–46.0)
Lymphs Abs: 0.8 K/uL (ref 0.7–4.0)
MCHC: 33.6 g/dL (ref 30.0–36.0)
MCV: 85.8 fl (ref 78.0–100.0)
Monocytes Absolute: 0.4 K/uL (ref 0.1–1.0)
Monocytes Relative: 7.7 % (ref 3.0–12.0)
Neutro Abs: 3.7 K/uL (ref 1.4–7.7)
Neutrophils Relative %: 74.4 % (ref 43.0–77.0)
Platelets: 218 K/uL (ref 150.0–400.0)
RBC: 4.72 Mil/uL (ref 3.87–5.11)
RDW: 15.4 % (ref 11.5–15.5)
WBC: 4.9 K/uL (ref 4.0–10.5)

## 2024-01-31 LAB — COMPREHENSIVE METABOLIC PANEL WITH GFR
ALT: 14 U/L (ref 0–35)
AST: 15 U/L (ref 0–37)
Albumin: 4.6 g/dL (ref 3.5–5.2)
Alkaline Phosphatase: 55 U/L (ref 39–117)
BUN: 16 mg/dL (ref 6–23)
CO2: 29 meq/L (ref 19–32)
Calcium: 9.7 mg/dL (ref 8.4–10.5)
Chloride: 101 meq/L (ref 96–112)
Creatinine, Ser: 0.73 mg/dL (ref 0.40–1.20)
GFR: 79.62 mL/min (ref >60.00)
Glucose, Bld: 87 mg/dL (ref 70–99)
Potassium: 4.5 meq/L (ref 3.5–5.1)
Sodium: 136 meq/L (ref 135–145)
Total Bilirubin: 0.4 mg/dL (ref 0.2–1.2)
Total Protein: 7.1 g/dL (ref 6.0–8.3)

## 2024-01-31 LAB — TSH: TSH: 4.05 u[IU]/mL (ref 0.35–5.50)

## 2024-01-31 MED ORDER — ATORVASTATIN CALCIUM 20 MG PO TABS: 20.0000 mg | ORAL_TABLET | Freq: Every day | ORAL | 1 refills | Status: AC

## 2024-01-31 MED ORDER — SERTRALINE HCL 100 MG PO TABS
ORAL_TABLET | ORAL | 3 refills | Status: AC
Start: 2024-01-31 — End: ?

## 2024-01-31 MED ORDER — LORAZEPAM 0.5 MG PO TABS: 0.5000 mg | ORAL_TABLET | Freq: Three times a day (TID) | ORAL | 1 refills | Status: AC | PRN

## 2024-01-31 NOTE — Progress Notes (Signed)
 Subjective:    Patient ID: Allison Cox, female    DOB: 09/22/1945, 78 y.o.   MRN: [id number]  Chief Complaint  Patient presents with   Hyperlipidemia   Weight Check   Follow-up    HPI Patient is in today for chol, weight and anxiety.  Discussed the use of AI scribe software for clinical note transcription with the patient, who gave verbal consent to proceed.  History of Present Illness Allison Cox is a 78 year old female who presents for medication refills and vaccination discussion.  She has experienced significant weight loss of 45 pounds, attributed to the use of Zepbound . She is pleased with the weight loss and notes that her clothes are now too large, requiring her to adjust her wardrobe. She has not experienced any issues with the medication.  She requests refills for her medications, including atorvastatin , sertraline , and lorazepam . She does not need a refill for Zepbound  at this time as she recently received a new month's supply.  She is considering getting the RSV vaccine, as she did not receive it last year. She is also interested in receiving the COVID-19 vaccine and a regular flu shot, noting that she is almost 78 years old.  She mentions that she has not had a recent mammogram and needs to schedule one. She also needs to schedule a bone density test, as she has had it done twice before at the breast center, but they no longer offer this service.  She recently returned from a family vacation in [place]  with her husband, Odis, and his two daughters and their families. She traveled to [place] and went on a [place] around the islands.    Past Medical History:  Diagnosis Date   Anxiety    Arthritis    knee   Colon polyps    hyperplastic and Adenomatous   Hyperlipidemia    Osteopenia    Squamous cell carcinoma of skin     Past Surgical History:  Procedure Laterality Date   CARPAL TUNNEL RELEASE Left    COLONOSCOPY  07/2012   [facility]  - polyps   KNEE ARTHROSCOPY Right    TOTAL KNEE ARTHROPLASTY Left 09/29/2021   Procedure: LEFT TOTAL KNEE ARTHROPLASTY;  Surgeon: Addie Cordella Hamilton, MD;  Location: [facility] OR;  Service: Orthopedics;  Laterality: Left;   WISDOM TOOTH EXTRACTION      Family History  Problem Relation Age of Onset   Stroke Mother    Heart attack Father    Coronary artery disease Other    Colon cancer Neg Hx    Rectal cancer Neg Hx    Stomach cancer Neg Hx    Colon polyps Neg Hx    Esophageal cancer Neg Hx     Social History   Socioeconomic History   Marital status: Single    Spouse name: Not on file   Number of children: 0   Years of education: Not on file   Highest education level: 12th grade  Occupational History   Not on file  Tobacco Use   Smoking status: Former    Current packs/day: 0.00    Types: Cigarettes    Quit date: 05/15/1986    Years since quitting: 37.7   Smokeless tobacco: Never  Vaping Use   Vaping status: Never Used  Substance and Sexual Activity   Alcohol use: Yes    Alcohol/week: 3.0 - 4.0 standard drinks of alcohol  Types: 3 - 4 Glasses of wine per week   Drug use: No   Sexual activity: Yes    Partners: Male    Birth control/protection: Post-menopausal  Other Topics Concern   Not on file  Social History Narrative   Not on file   Social Drivers of Health   Financial Resource Strain: Low Risk  (01/28/2024)   Overall Financial Resource Strain (CARDIA)    Difficulty of Paying Living Expenses: Not hard at all  Food Insecurity: No Food Insecurity (01/28/2024)   Hunger Vital Sign    Worried About Running Out of Food in the Last Year: Never true    Ran Out of Food in the Last Year: Never true  Transportation Needs: No Transportation Needs (01/28/2024)   PRAPARE - Administrator, Civil Service (Medical): No    Lack of Transportation (Non-Medical): No  Physical Activity: Insufficiently Active (01/28/2024)   Exercise Vital Sign    Days of Exercise per Week: 3  days    Minutes of Exercise per Session: 40 min  Stress: No Stress Concern Present (01/28/2024)   [employer] of Occupational Health - Occupational Stress Questionnaire    Feeling of Stress: Not at all  Social Connections: Moderately Integrated (01/28/2024)   Social Connection and Isolation Panel    Frequency of Communication with Friends and Family: More than three times a week    Frequency of Social Gatherings with Friends and Family: More than three times a week    Attends Religious Services: 1 to 4 times per year    Active Member of [employer] or Organizations: Yes    Attends Engineer, structural: More than 4 times per year    Marital Status: Divorced  Catering manager Violence: Not on file    Outpatient Medications Prior to Visit  Medication Sig Dispense Refill   tirzepatide  (ZEPBOUND ) 12.5 MG/0.5ML Pen Inject 12.5 mg into the skin once a week. Unable to order in our system as vials yet. Please dispense as vials 2 mL 1   Vitamin D , Ergocalciferol , (DRISDOL ) 1.25 MG (50000 UNIT) CAPS capsule Take 1 capsule (50,000 Units total) by mouth every 7 (seven) days. 12 capsule 1   atorvastatin  (LIPITOR) 20 MG tablet Take 1 tablet (20 mg total) by mouth daily. 90 tablet 1   LORazepam  (ATIVAN ) 0.5 MG tablet Take 1 tablet (0.5 mg total) by mouth every 8 (eight) hours as needed. for anxiety 90 tablet 1   sertraline  (ZOLOFT ) 100 MG tablet 1 1/2 po qd 135 tablet 3   cyclobenzaprine  (FLEXERIL ) 10 MG tablet Take 1 tablet (10 mg total) by mouth 3 (three) times daily as needed for muscle spasms. (Patient not taking: Reported on 01/31/2024) 30 tablet 0   mupirocin  ointment (BACTROBAN ) 2 % Apply 1 Application topically 2 (two) times daily. (Patient not taking: Reported on 11/27/2023) 22 g 0   No facility-administered medications prior to visit.    Allergies  Allergen Reactions   Codeine Nausea And Vomiting    SEVERE NAUSEA AND VOMITING-- do not give at all Hypotension severe    Review of  Systems  Constitutional:  Negative for chills, fever and malaise/fatigue.  HENT:  Negative for congestion and hearing loss.   Eyes:  Negative for discharge.  Respiratory:  Negative for cough, sputum production and shortness of breath.   Cardiovascular:  Negative for chest pain, palpitations and leg swelling.  Gastrointestinal:  Negative for abdominal pain, blood in stool, constipation, diarrhea, heartburn, nausea and vomiting.  Genitourinary:  Negative for dysuria, frequency, hematuria and urgency.  Musculoskeletal:  Negative for back pain, falls and myalgias.  Skin:  Negative for rash.  Neurological:  Negative for dizziness, sensory change, loss of consciousness, weakness and headaches.  Endo/Heme/Allergies:  Negative for environmental allergies. Does not bruise/bleed easily.  Psychiatric/Behavioral:  Negative for depression and suicidal ideas. The patient is not nervous/anxious and does not have insomnia.        Objective:    Physical Exam Vitals and nursing note reviewed.  Constitutional:      General: She is not in acute distress.    Appearance: Normal appearance. She is well-developed.  HENT:     Head: Normocephalic and atraumatic.     Right Ear: Tympanic membrane, ear canal and external ear normal. There is no impacted cerumen.     Left Ear: Tympanic membrane, ear canal and external ear normal. There is no impacted cerumen.     Nose: Nose normal.     Mouth/Throat:     Mouth: Mucous membranes are moist.     Pharynx: Oropharynx is clear. No oropharyngeal exudate or posterior oropharyngeal erythema.  Eyes:     General: No scleral icterus.       Right eye: No discharge.        Left eye: No discharge.     Conjunctiva/sclera: Conjunctivae normal.     Pupils: Pupils are equal, round, and reactive to light.  Neck:     Thyroid : No thyromegaly or thyroid  tenderness.     Vascular: No JVD.  Cardiovascular:     Rate and Rhythm: Normal rate and regular rhythm.     Heart sounds:  Normal heart sounds. No murmur heard. Pulmonary:     Effort: Pulmonary effort is normal. No respiratory distress.     Breath sounds: Normal breath sounds.  Abdominal:     General: Bowel sounds are normal. There is no distension.     Palpations: Abdomen is soft. There is no mass.     Tenderness: There is no abdominal tenderness. There is no guarding or rebound.  Genitourinary:    Vagina: Normal.  Musculoskeletal:        General: Normal range of motion.     Cervical back: Normal range of motion and neck supple.     Right lower leg: No edema.     Left lower leg: No edema.  Lymphadenopathy:     Cervical: No cervical adenopathy.  Skin:    General: Skin is warm and dry.     Findings: No erythema or rash.  Neurological:     Mental Status: She is alert and oriented to person, place, and time.     Cranial Nerves: No cranial nerve deficit.     Deep Tendon Reflexes: Reflexes are normal and symmetric.  Psychiatric:        Mood and Affect: Mood normal.        Behavior: Behavior normal.        Thought Content: Thought content normal.        Judgment: Judgment normal.     BP 104/78 (BP Location: Left Arm, Patient Position: Sitting, Cuff Size: Large)   Pulse 69   Temp 97.6 F (36.4 C) (Oral)   Resp 18   Ht 5' 7 (1.702 m)   Wt 203 lb 3.2 oz (92.2 kg)   SpO2 100%   BMI 31.83 kg/m  Wt Readings from Last 3 Encounters:  01/31/24 203 lb 3.2 oz (92.2 kg)  08/17/23 226 lb  9.6 oz (102.8 kg)  03/01/23 246 lb 9.6 oz (111.9 kg)    Diabetic Foot Exam - Simple   No data filed    Lab Results  Component Value Date   WBC 5.3 08/17/2023   HGB 13.3 08/17/2023   HCT 39.0 08/17/2023   PLT 221.0 08/17/2023   GLUCOSE 84 08/17/2023   CHOL 176 08/17/2023   TRIG 94.0 08/17/2023   HDL 59.40 08/17/2023   LDLDIRECT 125.1 10/06/2011   LDLCALC 97 08/17/2023   ALT 13 08/17/2023   AST 14 08/17/2023   NA 138 08/17/2023   K 4.4 08/17/2023   CL 100 08/17/2023   CREATININE 0.70 08/17/2023   BUN 19  08/17/2023   CO2 30 08/17/2023   TSH 4.12 03/01/2023    Lab Results  Component Value Date   TSH 4.12 03/01/2023   Lab Results  Component Value Date   WBC 5.3 08/17/2023   HGB 13.3 08/17/2023   HCT 39.0 08/17/2023   MCV 88.1 08/17/2023   PLT 221.0 08/17/2023   Lab Results  Component Value Date   NA 138 08/17/2023   K 4.4 08/17/2023   CO2 30 08/17/2023   GLUCOSE 84 08/17/2023   BUN 19 08/17/2023   CREATININE 0.70 08/17/2023   BILITOT 0.4 08/17/2023   ALKPHOS 58 08/17/2023   AST 14 08/17/2023   ALT 13 08/17/2023   PROT 6.9 08/17/2023   ALBUMIN 4.5 08/17/2023   CALCIUM  9.3 08/17/2023   ANIONGAP 5 09/21/2021   GFR 84.01 08/17/2023   Lab Results  Component Value Date   CHOL 176 08/17/2023   Lab Results  Component Value Date   HDL 59.40 08/17/2023   Lab Results  Component Value Date   LDLCALC 97 08/17/2023   Lab Results  Component Value Date   TRIG 94.0 08/17/2023   Lab Results  Component Value Date   CHOLHDL 3 08/17/2023   No results found for: HGBA1C     Assessment & Plan:  Need for influenza vaccination -     Flu vaccine HIGH DOSE PF(Fluzone Trivalent)  Mixed hyperlipidemia Assessment & Plan: Tolerating statin, encouraged heart healthy diet, avoid trans fats, minimize simple carbs and saturated fats. Increase exercise as tolerated   Orders: -     Atorvastatin  Calcium ; Take 1 tablet (20 mg total) by mouth daily.  Dispense: 90 tablet; Refill: 1 -     CBC with Differential/Platelet -     Comprehensive metabolic panel with GFR -     Lipid panel -     TSH  Generalized anxiety disorder -     Sertraline  HCl; 1 1/2 po qd  Dispense: 135 tablet; Refill: 3 -     LORazepam ; Take 1 tablet (0.5 mg total) by mouth every 8 (eight) hours as needed. for anxiety  Dispense: 90 tablet; Refill: 1 -     CBC with Differential/Platelet -     TSH  Estrogen deficiency -     DG Bone Density; Future  Anxiety state Assessment & Plan: Stable ---- con't zoloft  and as  needed ativan    Assessment and Plan Assessment & Plan Obesity   She achieved significant weight loss of 45 pounds with Zepbound , experiencing no adverse effects. She is satisfied with the weight loss and its positive impact on her health.  Mixed hyperlipidemia   This chronic condition is managed with atorvastatin . Refill atorvastatin  prescription.  Generalized anxiety disorder   This chronic condition is managed with sertraline  and lorazepam , with no acute exacerbations reported.  Refill sertraline  and lorazepam  prescriptions.  General Health Maintenance   Vaccinations for RSV, COVID-19, and influenza were discussed. RSV and COVID-19 vaccines are recommended for those over 65. A mammogram and bone density screening are due. Administer flu shot today. Schedule mammogram at the breast center and bone density scan at radiology downstairs.    Ligaya Cormier R Lowne Chase, DO

## 2024-01-31 NOTE — Patient Instructions (Signed)
 Bone Density Test In this video, you'll learn what a bone density test is, why it's done, and what it's like to have this test. To view the content, go to this web address: [email or web address]  This video will expire on: 04/25/2025. If you need access to this video following this date, please reach out to the healthcare provider who assigned it to you. This information is not intended to replace advice given to you by your health care provider. Make sure you discuss any questions you have with your health care provider. Elsevier Patient Education  2024 [employer].

## 2024-01-31 NOTE — Assessment & Plan Note (Signed)
 Stable ---- con't zoloft  and as needed ativan 

## 2024-01-31 NOTE — Assessment & Plan Note (Signed)
 Tolerating statin, encouraged heart healthy diet, avoid trans fats, minimize simple carbs and saturated fats. Increase exercise as tolerated

## 2024-02-01 ENCOUNTER — Ambulatory Visit: Payer: Self-pay | Admitting: Family Medicine

## 2024-02-12 MED ORDER — ZEPBOUND 12.5 MG/0.5ML ~~LOC~~ SOAJ: 12.5000 mg | SUBCUTANEOUS | 1 refills | Status: DC

## 2024-02-12 NOTE — Addendum Note (Signed)
 Addended by: ELOUISE POWELL HERO on: 02/12/2024 08:30 AM   Modules accepted: Orders

## 2024-02-19 ENCOUNTER — Other Ambulatory Visit: Payer: Self-pay | Admitting: Family Medicine

## 2024-02-19 DIAGNOSIS — Z1231 Encounter for screening mammogram for malignant neoplasm of breast: Secondary | ICD-10-CM

## 2024-02-26 ENCOUNTER — Ambulatory Visit
Admission: RE | Admit: 2024-02-26 | Discharge: 2024-02-26 | Disposition: A | Discharge status: Home / Self Care | Source: Ambulatory Visit

## 2024-02-26 DIAGNOSIS — Z1231 Encounter for screening mammogram for malignant neoplasm of breast: Secondary | ICD-10-CM | POA: Diagnosis not present

## 2024-03-10 ENCOUNTER — Ambulatory Visit (INDEPENDENT_AMBULATORY_CARE_PROVIDER_SITE_OTHER): Admitting: Surgical

## 2024-03-10 ENCOUNTER — Other Ambulatory Visit (INDEPENDENT_AMBULATORY_CARE_PROVIDER_SITE_OTHER): Payer: Self-pay

## 2024-03-10 ENCOUNTER — Encounter: Payer: Self-pay | Admitting: Surgical

## 2024-03-10 DIAGNOSIS — G8929 Other chronic pain: Secondary | ICD-10-CM

## 2024-03-10 DIAGNOSIS — M25562 Pain in left knee: Secondary | ICD-10-CM

## 2024-03-10 NOTE — Progress Notes (Signed)
 Follow-up Office Visit Note   Patient: Allison Cox           Date of Birth: [birth year]           MRN: [id number] Visit Date: 03/10/2024 Requested by: [address] [place], [place] [address] [place],  [place] [address] PCP: Antonio Meth, Jamee SAUNDERS, DO  Subjective: Chief Complaint  Patient presents with   Left Knee - Pain, Follow-up    HPI: BANEEN Cox is a 78 y.o. female who returns to the office for follow-up visit.    Plan at last visit was: Impression is 78 year old female with increasing discomfort in the left knee. Radiographs taken today demonstrate proximal pole patellar fracture with some displacement and lucency between the fragment and the main body of the patella. There is no evidence of significant compromise of the patellar component. No abnormality noted to the femoral or tibial components. With the lack of a specific injury event and the slightly worsening pain over the last 2 months, need CT scan to evaluate any osseous union that has or is occurring at the fracture site. She is already on vitamin D  supplementation. We will get her set up for a bone stimulator to limit the risk of nonunion since if this fracture continues to displace, this will likely result in severe disability and weakness for her left leg. Overall her strength is a lot better than expected based on her radiographs. She will avoid any heavy physical activity and avoid any knee flexion past 90 degrees. Plan for next steps based on CT scan results. I will call her with these results and we can discuss further.   Since then, patient notes she had CT scan.  We discussed the CT scan over messaging and over the phone back in April.  Tried to get bone stim approved but it was not covered by her insurance.  We also tried to have her follow-up with sooner appointment but she did not actually check her MyChart message after agreeing to appointment and is now here about 6 months later.  Over the last 6  months patient notes 50% improvement of her symptoms.  She feels like she has plateaued in the last 1 to 2 months in terms of pain improving.  Still complains of anterior pain that is primarily worse with stationary standing but not with walking.  She has no symptoms of instability or buckling.  Takes occasional Aleve.  Has occasional radiation of pain down into the shin but nothing consistent.  No numbness or tingling or low back pain.  Pain does not wake her up at night but will keep her from going to sleep as quickly as she would like.  This only happens once or twice out of the week.  No swelling.  Does feel some tightness at times.              ROS: All systems reviewed are negative as they relate to the chief complaint within the history of present illness.  Patient denies fevers or chills.  Assessment & Plan: Visit Diagnoses:  1. Chronic pain of left knee     Plan: Allison Cox is a 78 y.o. female who returns to the office for follow-up visit.  Plan from last visit was noted above in HPI.  They now return with 50% improvement since last visit.  Left knee radiographs taken today demonstrate no change in position of the superior fragment of the patella with  stable appearance compared with last set of radiographs 6 months ago.  Absolutely no tenderness over the site and no hint of quad weakness or any instability/buckling of the knee subjectively.  At this point, would recommend that patient continue with activities as tolerated and take occasional Aleve.  Would avoid kneeling on the knee and then we can see her back in 6 months for final check with new radiographs at that time to ensure no displacement.  Seems that this fragment has healed so do not see need for bone stimulator at this time.  She is having DEXA scan in about a week so it would be good to pay attention to see if she has osteoporosis and if she does (which could be the source of this atraumatic fragmentation) could set her up for  provider for osteoporosis management.  Otherwise follow-up in 6 months.  Follow-Up Instructions: No follow-ups on file.   Orders:  Orders Placed This Encounter  Procedures   XR Knee 1-2 Views Left   No orders of the defined types were placed in this encounter.     Procedures: No procedures performed   Clinical Data: No additional findings.  Objective: Vital Signs: There were no vitals taken for this visit.  Physical Exam:  Constitutional: Patient appears well-developed HEENT:  Head: Normocephalic Eyes:EOM are normal Neck: Normal range of motion Cardiovascular: Normal rate Pulmonary/chest: Effort normal Neurologic: Patient is alert Skin: Skin is warm Psychiatric: Patient has normal mood and affect  Ortho Exam: Ortho exam demonstrates absolutely no tenderness over the superior pole of the patella.  She has excellent strength of the quadricep with strength rated 5/5.  There is absolutely no extensor lag.  She has range of motion from 0 degrees extension to 125 degrees of knee flexion without any discomfort aside from a little bit of discomfort in the front of the knee at terminal knee flexion.  No calf tenderness.  Negative Homans' sign.  She has palpable DP and PT pulses.  Incision is well-healed.  Hamstring strength 5/5.  There is no crepitus noted with passive motion of the knee and there is no discontinuity noted around the patella or the quadricep tendon attachment.  She has no pain with palpation or percussion along any aspect of the patella.  Specialty Comments:  No specialty comments available.  Imaging: No results found.   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   Preop examination 09/22/2021    Chills (without fever) 07/05/2021   Other fatigue 07/05/2021   Preventative health care 10/21/2020   Primary hypertension 10/21/2020   Estrogen deficiency 10/21/2020   Right hip pain 04/27/2020   Generalized anxiety disorder 08/10/2017   Seasonal allergies 03/08/2017   Abdominal pain 02/19/2014   Obesity (BMI 30-39.9) 10/02/2013   INCONTINENCE, 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 08/11/2009   LOW BACK PAIN, CHRONIC 08/11/2009   History of colonic polyps 08/17/2008   Acute upper respiratory infection 04/17/2008   SINUSITIS- ACUTE-NOS 03/30/2008   Hyperlipidemia 09/26/2007   Anxiety state 01/01/2007   CYST, SEBACEOUS 01/01/2007   Disorder of bone and cartilage 01/01/2007   ARTHROSCOPY, RIGHT KNEE, HX OF 01/01/2007   Past Medical History:  Diagnosis Date   Anxiety    Arthritis  knee   Colon polyps    hyperplastic and Adenomatous   Hyperlipidemia    Osteopenia    Squamous cell carcinoma of skin     Family History  Problem Relation Age of Onset   Stroke Mother    Heart attack Father    Coronary artery disease Other    Colon cancer Neg Hx    Rectal cancer Neg Hx    Stomach cancer Neg Hx    Colon polyps Neg Hx    Esophageal cancer Neg Hx     Past Surgical History:  Procedure Laterality Date   CARPAL TUNNEL RELEASE Left    COLONOSCOPY  07/2012   [facility] - polyps   KNEE ARTHROSCOPY Right    TOTAL KNEE ARTHROPLASTY Left 09/29/2021   Procedure: LEFT TOTAL KNEE ARTHROPLASTY;  Surgeon: Addie Cordella Hamilton, MD;  Location: [facility] OR;  Service: Orthopedics;  Laterality: Left;   WISDOM TOOTH EXTRACTION     Social History   Occupational History   Not on file  Tobacco Use   Smoking status: Former    Current packs/day: 0.00    Types: Cigarettes    Quit date: 05/15/1986    Years since quitting: 37.8   Smokeless tobacco: Never  Vaping Use   Vaping status: Never Used  Substance and Sexual Activity   Alcohol use: Yes    Alcohol/week: 3.0 - 4.0 standard drinks of alcohol    Types: 3 - 4 Glasses of  wine per week   Drug use: No   Sexual activity: Yes    Partners: Male    Birth control/protection: Post-menopausal

## 2024-03-14 ENCOUNTER — Ambulatory Visit: Admitting: Surgical

## 2024-03-17 ENCOUNTER — Encounter: Payer: Self-pay | Admitting: Radiology

## 2024-03-20 ENCOUNTER — Other Ambulatory Visit (HOSPITAL_BASED_OUTPATIENT_CLINIC_OR_DEPARTMENT_OTHER)

## 2024-03-31 ENCOUNTER — Other Ambulatory Visit: Payer: Self-pay | Admitting: Family Medicine

## 2024-03-31 DIAGNOSIS — E88819 Insulin resistance, unspecified: Secondary | ICD-10-CM

## 2024-05-19 ENCOUNTER — Encounter (HOSPITAL_BASED_OUTPATIENT_CLINIC_OR_DEPARTMENT_OTHER): Payer: Self-pay

## 2024-05-19 ENCOUNTER — Other Ambulatory Visit (HOSPITAL_BASED_OUTPATIENT_CLINIC_OR_DEPARTMENT_OTHER)

## 2024-05-29 ENCOUNTER — Encounter: Payer: Self-pay | Admitting: Dermatology

## 2024-05-29 ENCOUNTER — Ambulatory Visit: Admitting: Dermatology

## 2024-05-29 DIAGNOSIS — L821 Other seborrheic keratosis: Secondary | ICD-10-CM | POA: Diagnosis not present

## 2024-05-29 DIAGNOSIS — D225 Melanocytic nevi of trunk: Secondary | ICD-10-CM

## 2024-05-29 DIAGNOSIS — L905 Scar conditions and fibrosis of skin: Secondary | ICD-10-CM

## 2024-05-29 DIAGNOSIS — Z85828 Personal history of other malignant neoplasm of skin: Secondary | ICD-10-CM | POA: Diagnosis not present

## 2024-05-29 DIAGNOSIS — L578 Other skin changes due to chronic exposure to nonionizing radiation: Secondary | ICD-10-CM | POA: Diagnosis not present

## 2024-05-29 DIAGNOSIS — Z1283 Encounter for screening for malignant neoplasm of skin: Secondary | ICD-10-CM | POA: Diagnosis not present

## 2024-05-29 DIAGNOSIS — L814 Other melanin hyperpigmentation: Secondary | ICD-10-CM | POA: Diagnosis not present

## 2024-05-29 DIAGNOSIS — D1801 Hemangioma of skin and subcutaneous tissue: Secondary | ICD-10-CM

## 2024-05-29 DIAGNOSIS — W908XXA Exposure to other nonionizing radiation, initial encounter: Secondary | ICD-10-CM | POA: Diagnosis not present

## 2024-05-29 DIAGNOSIS — D229 Melanocytic nevi, unspecified: Secondary | ICD-10-CM

## 2024-05-29 NOTE — Progress Notes (Unsigned)
 "  Total Body Skin Exam (TBSE) Visit  History of Present Illness Allison Cox is a 79 year old female who presents for a routine skin check and evaluation of post-surgical sites.  She has concerns about a spot from a previous surgery in December 2024, suspecting it might still have a stitch. There is no pain, only a 'little tickle' sensation. The area has been present since the surgery, and she has noticed two places like this, one of which she scratched and a keratin ball rolled out.  She also has a scaly spot, referred to as 'the scaly guy,' and another spot that was previously advised to be monitored. Her sun exposure is minimal, as she covers up when mowing the grass and uses makeup with SPF 30 or more.  She does not wear shorts in the summer. She recalls a spot being removed from her nose during her last visit. No new skin concerns are reported.  Patient presents today for follow up visit for TBSE. Patient was last evaluated on 11/27/2023 . Patient denies medication changes. Patient reports she does have spots, moles and lesions of concern to be evaluated. Patient reports throughout her lifetime she has had moderate sun exposure. Currently, patient reports if she has excessive sun exposure, she does not apply sunscreen and does wears protective coverings. Patient reports she has hx of bx. Patient denies  family history of skin cancers.   The following portions of the chart were reviewed this encounter and updated as appropriate: medications, allergies, medical history  Review of Systems:  No other skin or systemic complaints except as noted in HPI or Assessment and Plan.  Objective  Well appearing patient in no apparent distress; mood and affect are within normal limits.  A full examination was performed including scalp, head, eyes, ears, nose, lips, neck, chest, axillae, abdomen, back, buttocks, bilateral upper extremities, bilateral lower extremities, hands, feet, fingers, toes,  fingernails, and toenails. All findings within normal limits unless otherwise noted below.   Relevant physical exam findings are noted in the Assessment and Plan.         Assessment & Plan   LENTIGINES, SEBORRHEIC KERATOSES, HEMANGIOMAS  Benign normal skin lesions  - Benign-appearing - Call for any changes  MELANOCYTIC NEVI - R Abdomen Monitoring - Tan-brown and/or pink-flesh-colored symmetric macules and papules - Benign appearing on exam today - Observation - Call clinic for new or changing moles - Recommend daily use of broad spectrum spf 30+ sunscreen to sun-exposed areas.   ACTINIC DAMAGE - Chronic condition, secondary to cumulative UV/sun exposure - diffuse scaly erythematous macules with underlying dyspigmentation - Recommend daily broad spectrum sunscreen SPF 30+ to sun-exposed areas, reapply every 2 hours as needed.  - Staying in the shade or wearing long sleeves, sun glasses (UVA+UVB protection) and wide brim hats (4-inch brim around the entire circumference of the hat) are also recommended for sun protection.  - Call for new or changing lesions.  SKIN CANCER SCREENING PERFORMED TODAY.  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  - Call if any new or changing lesions are noted between office visits  Return in about 1 year (around 05/29/2025), or TBSE.  LILLETTE Virgle Boards, Mohs/Dermatology Tech am acting as a  neurosurgeon for RUFUS CHRISTELLA HOLY, MD.   Documentation: I have reviewed the above documentation for accuracy and completeness, and I agree with the above.  RUFUS CHRISTELLA HOLY, MD   "

## 2024-05-29 NOTE — Patient Instructions (Addendum)
 Use warm compress on Lower Right Leg and bandage.    Important Information  Due to recent changes in healthcare laws, you may see results of your pathology and/or laboratory studies on MyChart before the doctors have had a chance to review them. We understand that in some cases there may be results that are confusing or concerning to you. Please understand that not all results are received at the same time and often the doctors may need to interpret multiple results in order to provide you with the best plan of care or course of treatment. Therefore, we ask that you please give us  2 business days to thoroughly review all your results before contacting the office for clarification. Should we see a critical lab result, you will be contacted sooner.   If You Need Anything After Your Visit  If you have any questions or concerns for your doctor, please call our main line at [phone] If no one answers, please leave a voicemail as directed and we will return your call as soon as possible. Messages left after 4 pm will be answered the following business day.   You may also send us  a message via MyChart. We typically respond to MyChart messages within 1-2 business days.  For prescription refills, please ask your pharmacy to contact our office. Our fax number is [phone].  If you have an urgent issue when the clinic is closed that cannot wait until the next business day, you can page your doctor at the number below.    Please note that while we do our best to be available for urgent issues outside of office hours, we are not available 24/7.   If you have an urgent issue and are unable to reach us , you may choose to seek medical care at your doctor's office, retail clinic, urgent care center, or emergency room.  If you have a medical emergency, please immediately call 911 or go to the emergency department. In the event of inclement weather, please call our main line at [phone] for an update  on the status of any delays or closures.  Dermatology Medication Tips: Please keep the boxes that topical medications come in in order to help keep track of the instructions about where and how to use these. Pharmacies typically print the medication instructions only on the boxes and not directly on the medication tubes.   If your medication is too expensive, please contact our office at [phone] or send us  a message through MyChart.   We are unable to tell what your co-pay for medications will be in advance as this is different depending on your insurance coverage. However, we may be able to find a substitute medication at lower cost or fill out paperwork to get insurance to cover a needed medication.   If a prior authorization is required to get your medication covered by your insurance company, please allow us  1-2 business days to complete this process.  Drug prices often vary depending on where the prescription is filled and some pharmacies may offer cheaper prices.  The website www.goodrx.com contains coupons for medications through different pharmacies. The prices here do not account for what the cost may be with help from insurance (it may be cheaper with your insurance), but the website can give you the price if you did not use any insurance.  - You can print the associated coupon and take it with your prescription to the pharmacy.  - You may also stop by our office during regular  business hours and pick up a GoodRx coupon card.  - If you need your prescription sent electronically to a different pharmacy, notify our office through [facility] or by phone at [phone]

## 2024-05-30 ENCOUNTER — Encounter: Payer: Self-pay | Admitting: Dermatology

## 2024-06-02 ENCOUNTER — Ambulatory Visit: Admitting: *Deleted

## 2024-06-02 VITALS — Ht 67.0 in | Wt 180.0 lb

## 2024-06-02 DIAGNOSIS — Z0000 Encounter for general adult medical examination without abnormal findings: Secondary | ICD-10-CM

## 2024-06-02 NOTE — Patient Instructions (Addendum)
 Allison Cox,  Thank you for taking the time for your Medicare Wellness Visit. I appreciate your continued commitment to your health goals. Please review the care plan we discussed, and feel free to reach out if I can assist you further.  Please note that Annual Wellness Visits do not include a physical exam. Some assessments may be limited, especially if the visit was conducted virtually. If needed, we may recommend an in-person follow-up with your provider.  Goal: To start Chair Yoga  Ongoing Care Seeing your primary care provider every 3 to 6 months helps us  monitor your health and provide consistent, personalized care.   Dr Antonio: 08/01/24 10:20am, in person Medicare AWV: 06/04/25 1pm, mychart video  Referrals If a referral was made during today's visit and you haven't received any updates within two weeks, please contact the referred provider directly to check on the status.  Bone Density:  Please complete as scheduled for 06/10/24 2pm  Recommended Screenings: You will need to get the following vaccines at your local pharmacy: Tetanus  Health Maintenance  Topic Date Due   Medicare Annual Wellness Visit  Never done   DTaP/Tdap/Td vaccine (2 - Tdap) 06/28/2013   Osteoporosis screening with Bone Density Scan  04/14/2023   COVID-19 Vaccine (8 - Pfizer risk 2025-26 season) 08/19/2024   Colon Cancer Screening  12/21/2027   Pneumococcal Vaccine for age over 47  Completed   Flu Shot  Completed   Hepatitis C Screening  Completed   Zoster (Shingles) Vaccine  Completed   Meningitis B Vaccine  Aged Out   Breast Cancer Screening  Discontinued       06/01/2024   12:32 PM  Advanced Directives  Does Patient Have a Medical Advance Directive? Yes  Type of Estate Agent of [place];Living will  Does patient want to make changes to medical advance directive? No - Patient declined  Copy of Healthcare Power of Attorney in Chart? No - copy requested  Would patient like  information on creating a medical advance directive? No - Patient declined   Bring a copy of your health care power of attorney and living will to the office to be added to your chart at your convenience. You can mail a copy to [facility] [address] [place], [place] [address] or email to [email or web address]   Vision: Annual vision screenings are recommended for early detection of glaucoma, cataracts, and diabetic retinopathy. These exams can also reveal signs of chronic conditions such as diabetes and high blood pressure.  Dental: Annual dental screenings help detect early signs of oral cancer, gum disease, and other conditions linked to overall health, including heart disease and diabetes.  Please see the attached documents for additional preventive care recommendations.

## 2024-06-02 NOTE — Progress Notes (Signed)
 "  Chief Complaint  Patient presents with   Medicare Wellness     Subjective:   Allison Cox is a 79 y.o. female who presents for a Medicare Annual Wellness Visit.  Visit info / Clinical Intake: Medicare Wellness Visit Type:: Initial Annual Wellness Visit Persons participating in visit and providing information:: patient Medicare Wellness Visit Mode:: Telephone If telephone:: video error Since this visit was completed virtually, some vitals may be partially provided or unavailable. Missing vitals are due to the limitations of the virtual format.: Unable to obtain vitals - no equipment If Telephone or Video please confirm:: I connected with patient using audio/video enable telemedicine. I verified patient identity with two identifiers, discussed telehealth limitations, and patient agreed to proceed. Patient Location:: home Provider Location:: office Interpreter Needed?: No Pre-visit prep was completed: yes AWV questionnaire completed by patient prior to visit?: yes Living arrangements:: (!) (Patient-Rptd) lives alone Patient's Overall Health Status Rating: (Patient-Rptd) very good Typical amount of pain: (Patient-Rptd) none Does pain affect daily life?: (Patient-Rptd) no Are you currently prescribed opioids?: no  Dietary Habits and Nutritional Risks How many meals a day?: (Patient-Rptd) 2 Eats fruit and vegetables daily?: (Patient-Rptd) yes Most meals are obtained by: (Patient-Rptd) preparing own meals In the last 2 weeks, have you had any of the following?: none Diabetic:: no  Functional Status Activities of Daily Living (to include ambulation/medication): (Patient-Rptd) Independent Ambulation: (Patient-Rptd) Independent Medication Administration: (Patient-Rptd) Independent Home Management (perform basic housework or laundry): (Patient-Rptd) Independent Manage your own finances?: (Patient-Rptd) yes Primary transportation is: (Patient-Rptd) driving Concerns about vision?:  no *vision screening is required for WTM* (up to date with [employer]) Concerns about hearing?: no  Fall Screening Falls in the past year?: (Patient-Rptd) 0 Number of falls in past year: 0 Was there an injury with Fall?: 0 Fall Risk Category Calculator: 0 Patient Fall Risk Level: Low Fall Risk  Fall Risk Patient at Risk for Falls Due to: Orthopedic patient Fall risk Follow up: Falls evaluation completed  Home and Transportation Safety: All rugs have non-skid backing?: (Patient-Rptd) yes All stairs or steps have railings?: (Patient-Rptd) yes Grab bars in the bathtub or shower?: (Patient-Rptd) yes Have non-skid surface in bathtub or shower?: (Patient-Rptd) yes Good home lighting?: (Patient-Rptd) yes Regular seat belt use?: (Patient-Rptd) yes Hospital stays in the last year:: (Patient-Rptd) no  Cognitive Assessment Difficulty concentrating, remembering, or making decisions? : (Patient-Rptd) no Will 6CIT or Mini Cog be Completed: yes What year is it?: 0 points What month is it?: 0 points Give patient an address phrase to remember (5 components): [address], [place]  About what time is it?: 0 points Count backwards from 20 to 1: 0 points Say the months of the year in reverse: 0 points Repeat the address phrase from earlier: 0 points 6 CIT Score: 0 points  Advance Directives (For Healthcare) Does Patient Have a Medical Advance Directive?: Yes Does patient want to make changes to medical advance directive?: No - Patient declined Type of Advance Directive: Healthcare Power of [place]; Living will Copy of Healthcare Power of Attorney in Chart?: No - copy requested Copy of Living Will in Chart?: No - copy requested Would patient like information on creating a medical advance directive?: No - Patient declined  Reviewed/Updated  Reviewed/Updated: Reviewed All (Medical, Surgical, Family, Medications, Allergies, Care Teams, Patient Goals)    Allergies  (verified) Codeine   Current Medications (verified) Outpatient Encounter Medications as of 06/02/2024  Medication Sig   atorvastatin  (LIPITOR) 20 MG tablet Take  1 tablet (20 mg total) by mouth daily.   LORazepam  (ATIVAN ) 0.5 MG tablet Take 1 tablet (0.5 mg total) by mouth every 8 (eight) hours as needed. for anxiety   sertraline  (ZOLOFT ) 100 MG tablet 1 1/2 po qd   Tirzepatide -Weight Management (ZEPBOUND ) 12.5 MG/0.5ML SOLN Inject 12.5 mg into the skin once a week.   Vitamin D , Ergocalciferol , (DRISDOL ) 1.25 MG (50000 UNIT) CAPS capsule Take 1 capsule (50,000 Units total) by mouth every 7 (seven) days.   No facility-administered encounter medications on file as of 06/02/2024.    History: Past Medical History:  Diagnosis Date   Anxiety    Arthritis    knee   Colon polyps    hyperplastic and Adenomatous   Hyperlipidemia    Osteopenia    Squamous cell carcinoma of skin    right lower leg-anterior- Tx Mohs Dr. 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   Procedure: LEFT TOTAL KNEE ARTHROPLASTY;  Surgeon: Addie Cordella Hamilton, MD;  [facility] OR;  Service: Orthopedics;  Laterality: Left;   WISDOM TOOTH EXTRACTION     Family History  Problem Relation Age of Onset   Stroke Mother    Heart attack Father    Coronary artery disease Other    Colon cancer Neg Hx    Rectal cancer Neg Hx    Stomach cancer Neg Hx    Colon polyps Neg Hx    Esophageal cancer Neg Hx    Social History   Occupational History   Not on file  Tobacco Use   Smoking status: Former    Current packs/day: 0.00    Types: Cigarettes    Quit date: 05/15/1986    Years since quitting: 38.0   Smokeless tobacco: Never  Vaping Use   Vaping status: Never Used  Substance and Sexual Activity   Alcohol use: Not Currently    Alcohol/week: 0.0 - 2.0  standard drinks of alcohol   Drug use: No   Sexual activity: Yes    Partners: Male    Birth control/protection: Post-menopausal   Tobacco Counseling Counseling given: Not Answered  SDOH Screenings   Food Insecurity: No Food Insecurity (06/01/2024)  Housing: Low Risk (06/01/2024)  Transportation Needs: No Transportation Needs (06/01/2024)  Utilities: Not At Risk (06/02/2024)  Alcohol Screen: Low Risk (06/01/2024)  Depression (PHQ2-9): Low Risk (06/02/2024)  Financial Resource Strain: Low Risk (06/01/2024)  Physical Activity: Insufficiently Active (06/01/2024)  Social Connections: Socially Isolated (06/01/2024)  Stress: No Stress Concern Present (06/01/2024)  Tobacco Use: Medium Risk (06/02/2024)   See flowsheets for full screening details  Depression Screen PHQ 2 & 9 Depression Scale- Over the past 2 weeks, how often have you been bothered by any of the following problems? Little interest or pleasure in doing things: 0 Feeling down, depressed, or hopeless (PHQ Adolescent also includes...irritable): 0 PHQ-2 Total Score: 0 Trouble falling or staying asleep, or sleeping too much: 0 Feeling tired or having little energy: 0 Poor appetite or overeating (PHQ Adolescent also includes...weight loss): 0 Feeling bad about yourself - or that you are a failure or have let yourself or your family down: 0 Trouble concentrating on things, such as reading the newspaper or watching television (PHQ Adolescent also includes...like school work): 0 Moving or speaking so slowly that other people could have noticed.  Or the opposite - being so fidgety or restless that you have been moving around a lot more than usual: 0 Thoughts that you would be better off dead, or of hurting yourself in some way: 0 PHQ-9 Total Score: 0  Depression Treatment Depression Interventions/Treatment : Patient refuses Treatment     Goals Addressed             This Visit's Progress    I want to start Chair Yoga       Increase  physical activity               Objective:    Today's Vitals   06/02/24 1026  Weight: 180 lb (81.6 kg)  Height: 5' 7 (1.702 m)   Body mass index is 28.19 kg/m.  Hearing/Vision screen No results found. Immunizations and Health Maintenance Health Maintenance  Topic Date Due   DTaP/Tdap/Td (2 - Tdap) 06/28/2013   Bone Density Scan  04/14/2023   COVID-19 Vaccine (8 - Pfizer risk 2025-26 season) 08/19/2024   Medicare Annual Wellness (AWV)  06/02/2025   Colonoscopy  12/21/2027   Pneumococcal Vaccine: 50+ Years  Completed   Influenza Vaccine  Completed   Hepatitis C Screening  Completed   Zoster Vaccines- Shingrix  Completed   Meningococcal B Vaccine  Aged Out   Mammogram  Discontinued        Assessment/Plan:  This is a routine wellness examination for [place].  Patient Care Team: Antonio Meth, Jamee SAUNDERS, DO as PCP - General Hughie Sharper, MD (Sports Medicine) Addie, Cordella Hamilton, MD as Consulting Physician (Orthopedic Surgery) Paci, Rufus HERO, MD (Dermatology) Magnant, Carlin CROME, PA-C as Physician Assistant (Orthopedic Surgery) [employer]  I have personally reviewed and noted the following in the patients chart:   Medical and social history Use of alcohol, tobacco or illicit drugs  Current medications and supplements including opioid prescriptions. Functional ability and status Nutritional status Physical activity Advanced directives List of other physicians Hospitalizations, surgeries, and ER visits in previous 12 months Vitals Screenings to include cognitive, depression, and falls Referrals and appointments  No orders of the defined types were placed in this encounter.  In addition, I have reviewed and discussed with patient certain preventive protocols, quality metrics, and best practice recommendations. A written personalized care plan for preventive services as well as general preventive health recommendations were provided to  patient.   Lolita Libra, CMA   06/02/2024   Return in 1 year (on 06/02/2025).  After Visit Summary: (MyChart) Due to this being a telephonic visit, the after visit summary with patients personalized plan was offered to patient via MyChart   Nurse Notes: HM Addressed: Vaccines Due: Tetanus DEXA scheduled  "

## 2024-06-03 NOTE — Progress Notes (Signed)
 "  Chief Complaint  Patient presents with   Medicare Wellness     Subjective:   Allison Cox is a 79 y.o. female who presents for a Medicare Annual Wellness Visit.  Visit info / Clinical Intake: Medicare Wellness Visit Type:: Subsequent Annual Wellness Visit Persons participating in visit and providing information:: patient Medicare Wellness Visit Mode:: Telephone If telephone:: video error Since this visit was completed virtually, some vitals may be partially provided or unavailable. Missing vitals are due to the limitations of the virtual format.: Unable to obtain vitals - no equipment If Telephone or Video please confirm:: I connected with patient using audio/video enable telemedicine. I verified patient identity with two identifiers, discussed telehealth limitations, and patient agreed to proceed. Patient Location:: home Provider Location:: office Interpreter Needed?: No Pre-visit prep was completed: yes AWV questionnaire completed by patient prior to visit?: yes Date:: 06/01/24 Living arrangements:: (!) (Patient-Rptd) lives alone Patient's Overall Health Status Rating: (Patient-Rptd) very good Typical amount of pain: (Patient-Rptd) none Does pain affect daily life?: (Patient-Rptd) no Are you currently prescribed opioids?: no  Dietary Habits and Nutritional Risks How many meals a day?: (Patient-Rptd) 2 Eats fruit and vegetables daily?: (Patient-Rptd) yes Most meals are obtained by: (Patient-Rptd) preparing own meals In the last 2 weeks, have you had any of the following?: none Diabetic:: no  Functional Status Activities of Daily Living (to include ambulation/medication): (Patient-Rptd) Independent Ambulation: (Patient-Rptd) Independent Medication Administration: (Patient-Rptd) Independent Home Management (perform basic housework or laundry): (Patient-Rptd) Independent Manage your own finances?: (Patient-Rptd) yes Primary transportation is: (Patient-Rptd)  driving Concerns about vision?: no *vision screening is required for WTM* (up to date with [employer]) Concerns about hearing?: no  Fall Screening Falls in the past year?: (Patient-Rptd) 0 Number of falls in past year: 0 Was there an injury with Fall?: 0 Fall Risk Category Calculator: 0 Patient Fall Risk Level: Low Fall Risk  Fall Risk Patient at Risk for Falls Due to: Orthopedic patient Fall risk Follow up: Falls evaluation completed  Home and Transportation Safety: All rugs have non-skid backing?: (Patient-Rptd) yes All stairs or steps have railings?: (Patient-Rptd) yes Grab bars in the bathtub or shower?: (Patient-Rptd) yes Have non-skid surface in bathtub or shower?: (Patient-Rptd) yes Good home lighting?: (Patient-Rptd) yes Regular seat belt use?: (Patient-Rptd) yes Hospital stays in the last year:: (Patient-Rptd) no  Cognitive Assessment Difficulty concentrating, remembering, or making decisions? : (Patient-Rptd) no Will 6CIT or Mini Cog be Completed: yes What year is it?: 0 points What month is it?: 0 points Give patient an address phrase to remember (5 components): [address] [employer] [place]  About what time is it?: 0 points Count backwards from 20 to 1: 0 points Say the months of the year in reverse: 0 points Repeat the address phrase from earlier: 0 points 6 CIT Score: 0 points  Advance Directives (For Healthcare) Does Patient Have a Medical Advance Directive?: Yes Does patient want to make changes to medical advance directive?: No - Patient declined Type of Advance Directive: Healthcare Power of [place]; Living will Copy of Healthcare Power of Attorney in Chart?: No - copy requested Copy of Living Will in Chart?: No - copy requested Would patient like information on creating a medical advance directive?: No - Patient declined  Reviewed/Updated  Reviewed/Updated: Reviewed All (Medical, Surgical, Family, Medications, Allergies, Care Teams, Patient  Goals)    Allergies (verified) Codeine   Current Medications (verified) Outpatient Encounter Medications as of 06/02/2024  Medication Sig   atorvastatin  (LIPITOR) 20 MG  tablet Take 1 tablet (20 mg total) by mouth daily.   LORazepam  (ATIVAN ) 0.5 MG tablet Take 1 tablet (0.5 mg total) by mouth every 8 (eight) hours as needed. for anxiety   sertraline  (ZOLOFT ) 100 MG tablet 1 1/2 po qd   Tirzepatide -Weight Management (ZEPBOUND ) 12.5 MG/0.5ML SOLN Inject 12.5 mg into the skin once a week.   Vitamin D , Ergocalciferol , (DRISDOL ) 1.25 MG (50000 UNIT) CAPS capsule Take 1 capsule (50,000 Units total) by mouth every 7 (seven) days.   No facility-administered encounter medications on file as of 06/02/2024.    History: Past Medical History:  Diagnosis Date   Anxiety    Arthritis    knee   Colon polyps    hyperplastic and Adenomatous   Hyperlipidemia    Osteopenia    Squamous cell carcinoma of skin    right lower leg-anterior- Tx Mohs Dr. 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   Procedure: LEFT TOTAL KNEE ARTHROPLASTY;  Surgeon: Addie Cordella Hamilton, MD;  Location: [facility] OR;  Service: Orthopedics;  Laterality: Left;   WISDOM TOOTH EXTRACTION     Family History  Problem Relation Age of Onset   Stroke Mother    Heart attack Father    Coronary artery disease Other    Colon cancer Neg Hx    Rectal cancer Neg Hx    Stomach cancer Neg Hx    Colon polyps Neg Hx    Esophageal cancer Neg Hx    Social History   Occupational History   Not on file  Tobacco Use   Smoking status: Former    Current packs/day: 0.00    Types: Cigarettes    Quit date: 05/15/1986    Years since quitting: 38.0   Smokeless tobacco: Never  Vaping Use   Vaping status: Never Used  Substance and Sexual Activity   Alcohol use: Not Currently     Alcohol/week: 0.0 - 2.0 standard drinks of alcohol   Drug use: No   Sexual activity: Yes    Partners: Male    Birth control/protection: Post-menopausal   Tobacco Counseling Counseling given: Not Answered  SDOH Screenings   Food Insecurity: No Food Insecurity (06/01/2024)  Housing: Low Risk (06/01/2024)  Transportation Needs: No Transportation Needs (06/01/2024)  Utilities: Not At Risk (06/02/2024)  Alcohol Screen: Low Risk (06/01/2024)  Depression (PHQ2-9): Low Risk (06/02/2024)  Financial Resource Strain: Low Risk (06/01/2024)  Physical Activity: Insufficiently Active (06/01/2024)  Social Connections: Socially Isolated (06/01/2024)  Stress: No Stress Concern Present (06/01/2024)  Tobacco Use: Medium Risk (06/02/2024)   See flowsheets for full screening details  Depression Screen PHQ 2 & 9 Depression Scale- Over the past 2 weeks, how often have you been bothered by any of the following problems? Little interest or pleasure in doing things: 0 Feeling down, depressed, or hopeless (PHQ Adolescent also includes...irritable): 0 PHQ-2 Total Score: 0 Trouble falling or staying asleep, or sleeping too much: 0 Feeling tired or having little energy: 0 Poor appetite or overeating (PHQ Adolescent also includes...weight loss): 0 Feeling bad about yourself - or that you are a failure or have let yourself or your family down: 0 Trouble concentrating on things, such as reading the newspaper or watching television (PHQ Adolescent also includes...like school work): 0 Moving or speaking so slowly that other people could  have noticed. Or the opposite - being so fidgety or restless that you have been moving around a lot more than usual: 0 Thoughts that you would be better off dead, or of hurting yourself in some way: 0 PHQ-9 Total Score: 0  Depression Treatment Depression Interventions/Treatment : Patient refuses Treatment     Goals Addressed             This Visit's Progress    I want to start  Chair Yoga       Increase physical activity               Objective:    Today's Vitals   06/02/24 1026  Weight: 180 lb (81.6 kg)  Height: 5' 7 (1.702 m)   Body mass index is 28.19 kg/m.  Hearing/Vision screen No results found. Immunizations and Health Maintenance Health Maintenance  Topic Date Due   DTaP/Tdap/Td (2 - Tdap) 06/28/2013   Bone Density Scan  04/14/2023   COVID-19 Vaccine (8 - Pfizer risk 2025-26 season) 08/19/2024   Medicare Annual Wellness (AWV)  06/02/2025   Colonoscopy  12/21/2027   Pneumococcal Vaccine: 50+ Years  Completed   Influenza Vaccine  Completed   Hepatitis C Screening  Completed   Zoster Vaccines- Shingrix  Completed   Meningococcal B Vaccine  Aged Out   Mammogram  Discontinued        Assessment/Plan:  This is a routine wellness examination for [place].  Patient Care Team: Antonio Meth, Jamee SAUNDERS, DO as PCP - General Hughie Sharper, MD (Sports Medicine) Addie, Cordella Hamilton, MD as Consulting Physician (Orthopedic Surgery) Paci, Rufus HERO, MD (Dermatology) Magnant, Carlin CROME, PA-C as Physician Assistant (Orthopedic Surgery) [employer]  I have personally reviewed and noted the following in the patients chart:   Medical and social history Use of alcohol, tobacco or illicit drugs  Current medications and supplements including opioid prescriptions. Functional ability and status Nutritional status Physical activity Advanced directives List of other physicians Hospitalizations, surgeries, and ER visits in previous 12 months Vitals Screenings to include cognitive, depression, and falls Referrals and appointments  No orders of the defined types were placed in this encounter.  In addition, I have reviewed and discussed with patient certain preventive protocols, quality metrics, and best practice recommendations. A written personalized care plan for preventive services as well as general preventive health recommendations were  provided to patient.   Lolita Libra, CMA   06/03/2024   Return in 1 year (on 06/02/2025).  After Visit Summary: (MyChart) Due to this being a telephonic visit, the after visit summary with patients personalized plan was offered to patient via MyChart   Nurse Notes: HM Addressed: Vaccines Due: Tetanus DEXA scheduled  "

## 2024-06-10 ENCOUNTER — Other Ambulatory Visit (HOSPITAL_BASED_OUTPATIENT_CLINIC_OR_DEPARTMENT_OTHER)

## 2024-06-18 ENCOUNTER — Ambulatory Visit (HOSPITAL_BASED_OUTPATIENT_CLINIC_OR_DEPARTMENT_OTHER)
Admission: RE | Admit: 2024-06-18 | Discharge: 2024-06-18 | Disposition: A | Source: Ambulatory Visit | Attending: Family Medicine | Admitting: Family Medicine

## 2024-06-18 DIAGNOSIS — E2839 Other primary ovarian failure: Secondary | ICD-10-CM

## 2024-06-20 ENCOUNTER — Other Ambulatory Visit: Payer: Self-pay | Admitting: Family Medicine

## 2024-06-20 DIAGNOSIS — E88819 Insulin resistance, unspecified: Secondary | ICD-10-CM

## 2024-08-01 ENCOUNTER — Ambulatory Visit: Admitting: Family Medicine

## 2024-09-12 ENCOUNTER — Ambulatory Visit: Admitting: Surgical

## 2025-06-04 ENCOUNTER — Ambulatory Visit
